# Patient Record
Sex: Female | Born: 1945 | Race: White | Hispanic: No | Marital: Married | Smoking: Never smoker
Health system: Southern US, Community
[De-identification: ages and names within clinical notes are randomized; demographics above are authoritative.]

## PROBLEM LIST (undated history)

## (undated) DIAGNOSIS — N200 Calculus of kidney: Secondary | ICD-10-CM

## (undated) DIAGNOSIS — E079 Disorder of thyroid, unspecified: Secondary | ICD-10-CM

## (undated) HISTORY — PX: CHOLECYSTECTOMY: SHX55

## (undated) HISTORY — PX: APPENDECTOMY: SHX54

## (undated) HISTORY — PX: ABDOMINAL HYSTERECTOMY: SHX81

---

## 1999-10-22 ENCOUNTER — Other Ambulatory Visit: Admission: RE | Admit: 1999-10-22 | Discharge: 1999-10-22 | Payer: Self-pay | Admitting: Family Medicine

## 2014-10-13 ENCOUNTER — Emergency Department (HOSPITAL_COMMUNITY)
Admission: EM | Admit: 2014-10-13 | Discharge: 2014-10-14 | Disposition: A | Discharge status: Home / Self Care | Payer: Medicare Other | Attending: Emergency Medicine | Admitting: Emergency Medicine

## 2014-10-13 ENCOUNTER — Encounter (HOSPITAL_COMMUNITY): Payer: Self-pay | Admitting: Emergency Medicine

## 2014-10-13 ENCOUNTER — Emergency Department (HOSPITAL_COMMUNITY): Payer: Medicare Other

## 2014-10-13 DIAGNOSIS — E079 Disorder of thyroid, unspecified: Secondary | ICD-10-CM | POA: Diagnosis not present

## 2014-10-13 DIAGNOSIS — R110 Nausea: Secondary | ICD-10-CM | POA: Insufficient documentation

## 2014-10-13 DIAGNOSIS — Z79899 Other long term (current) drug therapy: Secondary | ICD-10-CM | POA: Diagnosis not present

## 2014-10-13 DIAGNOSIS — R109 Unspecified abdominal pain: Secondary | ICD-10-CM | POA: Diagnosis present

## 2014-10-13 DIAGNOSIS — N201 Calculus of ureter: Secondary | ICD-10-CM | POA: Diagnosis not present

## 2014-10-13 HISTORY — DX: Disorder of thyroid, unspecified: E07.9

## 2014-10-13 LAB — CBC WITH DIFFERENTIAL/PLATELET
BASOS ABS: 0 10*3/uL (ref 0.0–0.1)
BASOS PCT: 0 % (ref 0–1)
EOS ABS: 0.1 10*3/uL (ref 0.0–0.7)
EOS PCT: 2 % (ref 0–5)
HCT: 40.5 % (ref 36.0–46.0)
Hemoglobin: 13.4 g/dL (ref 12.0–15.0)
LYMPHS ABS: 1.7 10*3/uL (ref 0.7–4.0)
Lymphocytes Relative: 19 % (ref 12–46)
MCH: 29.3 pg (ref 26.0–34.0)
MCHC: 33.1 g/dL (ref 30.0–36.0)
MCV: 88.6 fL (ref 78.0–100.0)
Monocytes Absolute: 0.7 10*3/uL (ref 0.1–1.0)
Monocytes Relative: 7 % (ref 3–12)
Neutro Abs: 6.5 10*3/uL (ref 1.7–7.7)
Neutrophils Relative %: 72 % (ref 43–77)
PLATELETS: 240 10*3/uL (ref 150–400)
RBC: 4.57 MIL/uL (ref 3.87–5.11)
RDW: 14 % (ref 11.5–15.5)
WBC: 9 10*3/uL (ref 4.0–10.5)

## 2014-10-13 LAB — COMPREHENSIVE METABOLIC PANEL
ALBUMIN: 3.9 g/dL (ref 3.5–5.2)
ALK PHOS: 95 U/L (ref 39–117)
ALT: 21 U/L (ref 0–35)
AST: 23 U/L (ref 0–37)
Anion gap: 15 (ref 5–15)
BUN: 19 mg/dL (ref 6–23)
CALCIUM: 9.3 mg/dL (ref 8.4–10.5)
CO2: 24 mEq/L (ref 19–32)
Chloride: 102 mEq/L (ref 96–112)
Creatinine, Ser: 0.95 mg/dL (ref 0.50–1.10)
GFR calc non Af Amer: 60 mL/min — ABNORMAL LOW (ref >90)
GFR, EST AFRICAN AMERICAN: 69 mL/min — AB (ref >90)
Glucose, Bld: 153 mg/dL — ABNORMAL HIGH (ref 70–99)
POTASSIUM: 4.3 meq/L (ref 3.7–5.3)
Sodium: 141 mEq/L (ref 137–147)
Total Bilirubin: 0.2 mg/dL — ABNORMAL LOW (ref 0.3–1.2)
Total Protein: 7.5 g/dL (ref 6.0–8.3)

## 2014-10-13 LAB — URINALYSIS, ROUTINE W REFLEX MICROSCOPIC
Bilirubin Urine: NEGATIVE
GLUCOSE, UA: NEGATIVE mg/dL
Ketones, ur: NEGATIVE mg/dL
Leukocytes, UA: NEGATIVE
Nitrite: NEGATIVE
PH: 5.5 (ref 5.0–8.0)
Protein, ur: 30 mg/dL — AB
SPECIFIC GRAVITY, URINE: 1.025 (ref 1.005–1.030)
Urobilinogen, UA: 0.2 mg/dL (ref 0.0–1.0)

## 2014-10-13 LAB — LIPASE, BLOOD: Lipase: 44 U/L (ref 11–59)

## 2014-10-13 LAB — URINE MICROSCOPIC-ADD ON

## 2014-10-13 MED ORDER — ONDANSETRON HCL 4 MG/2ML IJ SOLN
4.0000 mg | Freq: Once | INTRAMUSCULAR | Status: AC
  Administered 2014-10-13: 4 mg via INTRAVENOUS
  Filled 2014-10-13: qty 2

## 2014-10-13 MED ORDER — IOHEXOL 300 MG/ML  SOLN
25.0000 mL | Freq: Once | INTRAMUSCULAR | Status: AC | PRN
  Administered 2014-10-13: 25 mL via ORAL

## 2014-10-13 MED ORDER — FENTANYL CITRATE 0.05 MG/ML IJ SOLN
50.0000 ug | Freq: Once | INTRAMUSCULAR | Status: AC
  Administered 2014-10-13: 50 ug via INTRAVENOUS
  Filled 2014-10-13: qty 2

## 2014-10-13 MED ORDER — IOHEXOL 300 MG/ML  SOLN
100.0000 mL | Freq: Once | INTRAMUSCULAR | Status: AC | PRN
  Administered 2014-10-13: 100 mL via INTRAVENOUS

## 2014-10-13 MED ORDER — KETOROLAC TROMETHAMINE 30 MG/ML IJ SOLN
30.0000 mg | Freq: Once | INTRAMUSCULAR | Status: AC
  Administered 2014-10-13: 30 mg via INTRAVENOUS
  Filled 2014-10-13: qty 1

## 2014-10-13 NOTE — ED Notes (Addendum)
Pt presents with sudden onset left lower quadrant abdominal pain after eating- pt reports she was sitting in a chair when pain started.  Admits to normal bowel movements today, denies N/V.  Pt reports that for the past 3 weeks she had had some abdominal pain in the morning.  Denies urinary symptoms.

## 2014-10-13 NOTE — ED Notes (Signed)
CT informed pt has finished contrast 

## 2014-10-13 NOTE — ED Provider Notes (Signed)
CSN: [id number]     Arrival date & time 10/13/14  2114 History   First MD Initiated Contact with Patient 10/13/14 2241     Chief Complaint  Patient presents with  . Abdominal Pain    (Consider location/radiation/quality/duration/timing/severity/associated sxs/prior Treatment) HPI Comments: Patient is a 68 year old female with history of thyroid disease and kidney stones who presents to the emergency department for abdominal pain. Patient states that pain began fairly suddenly and onset at 1830 today. She states that pain is sharp in nature and present in her left side and radiates to her left suprapubic region. Patient states that she broke out in a cold sweat at onset of her symptoms. She also endorses associated nausea without emesis. She states that she had eaten supper just before her symptoms began. When the pain worsens she feels as though it is difficult for her to take a deep breath, though she denies feeling short of breath. Patient denies associated fever, chest pain, diarrhea, melena, hematochezia, urinary symptoms including dysuria or hematuria, and weakness or numbness. Patient states that she has had 3 normal bowel movements today. Abdominal surgical history significant for appendectomy, cholecystectomy, and partial hysterectomy.  Patient is a 68 y.o. female presenting with abdominal pain. The history is provided by the patient. No language interpreter was used.  Abdominal Pain Associated symptoms: nausea     Past Medical History  Diagnosis Date  . Thyroid disease    Past Surgical History  Procedure Laterality Date  . Abdominal hysterectomy    . Appendectomy    . Cholecystectomy     No family history on file. History  Substance Use Topics  . Smoking status: Never Smoker   . Smokeless tobacco: Not on file  . Alcohol Use: No   OB History    No data available      Review of Systems  Gastrointestinal: Positive for nausea and abdominal pain.  All other systems  reviewed and are negative.   Allergies  Codeine and Morphine and related  Home Medications   Prior to Admission medications   Medication Sig Start Date End Date Taking? Authorizing Provider  aspirin 325 MG EC tablet Take 325 mg by mouth every 6 (six) hours as needed for pain.   Yes Historical Provider, MD  Calcium Carbonate (OS-CAL PO) Take 1 tablet by mouth daily.   Yes Historical Provider, MD  cholecalciferol (VITAMIN D) 1000 UNITS tablet Take 1,500 Units by mouth daily.   Yes Historical Provider, MD  DHA-EPA-Coenzyme Q10-Vitamin E (CARDIOVID PO) Take 1 tablet by mouth daily.   Yes Historical Provider, MD  levothyroxine (SYNTHROID, LEVOTHROID) 150 MCG tablet Take 150 mcg by mouth at bedtime.   Yes Historical Provider, MD  OVER THE COUNTER MEDICATION Take 1 tablet by mouth daily. "Breathe ease"   Yes Historical Provider, MD  OVER THE COUNTER MEDICATION Take 1 tablet by mouth daily. "sugar reg"   Yes Historical Provider, MD  ibuprofen (ADVIL,MOTRIN) 600 MG tablet Take 1 tablet (600 mg total) by mouth every 6 (six) hours as needed. 10/14/14   Antony MaduraKelly Johntae Broxterman, PA-C  ondansetron (ZOFRAN) 4 MG tablet Take 1 tablet (4 mg total) by mouth every 6 (six) hours. As needed for nausea 10/14/14   Antony MaduraKelly Zeena Starkel, PA-C  oxyCODONE-acetaminophen (PERCOCET/ROXICET) 5-325 MG per tablet Take 1-2 tablets by mouth every 6 (six) hours as needed for moderate pain or severe pain. 10/14/14   Antony MaduraKelly Umberto Pavek, PA-C   BP 156/79 mmHg  Pulse 79  Temp(Src) 97.8 F (36.6 C) (  Oral)  Resp 16  Ht 5\' 4"  (1.626 m)  Wt 228 lb (103.42 kg)  BMI 39.12 kg/m2  SpO2 96%   Physical Exam  Constitutional: She is oriented to person, place, and time. She appears well-developed and well-nourished. No distress.  Nontoxic/nonseptic appearing  HENT:  Head: Normocephalic and atraumatic.  Eyes: Conjunctivae and EOM are normal. No scleral icterus.  Neck: Normal range of motion.  Cardiovascular: Normal rate, regular rhythm and normal heart sounds.     Pulmonary/Chest: Effort normal. No respiratory distress. She has no wheezes. She has no rales.  Lungs clear bilaterally. Chest expansion symmetric  Abdominal: Normal appearance. She exhibits no mass. There is no CVA tenderness.    Soft abdomen with tenderness to palpation in the left mid abdomen. No peritoneal signs or involuntary guarding. No distention noted. No CVA TTP.  Musculoskeletal: Normal range of motion.  Neurological: She is alert and oriented to person, place, and time. She exhibits normal muscle tone. Coordination normal.  GCS 15. Patient moving all extremities.  Skin: Skin is warm and dry. No rash noted. She is not diaphoretic. No erythema. No pallor.  Psychiatric: She has a normal mood and affect. Her behavior is normal.  Nursing note and vitals reviewed.   ED Course  Procedures (including critical care time) Labs Review Labs Reviewed  COMPREHENSIVE METABOLIC PANEL - Abnormal; Notable for the following:    Glucose, Bld 153 (*)    Total Bilirubin 0.2 (*)    GFR calc non Af Amer 60 (*)    GFR calc Af Amer 69 (*)    All other components within normal limits  URINALYSIS, ROUTINE W REFLEX MICROSCOPIC - Abnormal; Notable for the following:    APPearance CLOUDY (*)    Hgb urine dipstick MODERATE (*)    Protein, ur 30 (*)    All other components within normal limits  URINE MICROSCOPIC-ADD ON - Abnormal; Notable for the following:    Squamous Epithelial / LPF MANY (*)    Bacteria, UA FEW (*)    All other components within normal limits  CBC WITH DIFFERENTIAL  LIPASE, BLOOD    Imaging Review Ct Abdomen Pelvis W Contrast  10/14/2014   CLINICAL DATA:  Severe left-sided abdominal pain after eating.  EXAM: CT ABDOMEN AND PELVIS WITH CONTRAST  TECHNIQUE: Multidetector CT imaging of the abdomen and pelvis was performed using the standard protocol following bolus administration of intravenous contrast.  CONTRAST:  100mL OMNIPAQUE IOHEXOL 300 MG/ML  SOLN  COMPARISON:  04/08/2006   FINDINGS: Lung bases are clear. There is an abnormal tissue density structure in the posterior mediastinum to the right of midline. This was probably present in 2007. It now appears to be associated with low density adenopathy in the inferior hilum. This is not completely evaluated. Chest CT would probably be warranted to understand this more completely.  The liver shows a 4 mm cyst in the lateral segment of the left lobe. No other liver parenchymal abnormality. There has been previous cholecystectomy. The spleen is normal. The pancreas is normal. The adrenal glands are normal. The aorta shows atherosclerosis but no aneurysm. The IVC is normal.  The right kidney is normal. The left kidney is swollen and there is surrounding edema likely related to pyelosinus extravasation. There is an obstructing stone at the UPJ measuring 11 mm in diameter. No stones seen in the ureter distal to that. No stone in the bladder.  There is no acute bowel pathology. There is diverticulosis without evidence of  diverticulitis. No significant bony finding.  IMPRESSION: 11 mm stone at the left UPJ resulting in high-grade obstruction and pyelo sinus extravasation.  Abnormal structure in the posterior mediastinum, not fully characterize. This appears to be associated with either low-density adenopathy or a cyst at the inferior hilum on the right. It would probably be worthwhile doing a full chest CT with contrast to understand this process more completely. This could be done non emergently as an outpatient.   Electronically Signed   By: Paulina Fusi M.D.   On: 10/14/2014 00:20     EKG Interpretation None      MDM   Final diagnoses:  Abdominal pain  Ureterolithiasis    Pt has been diagnosed with a kidney stone via CT. 11mm stone seen at UPJ. Case discussed with Dr. Isabel Caprice who recommends pain control with outpatient follow up. No evidence of associated UTI. Serum creatine WNL, vitals sign stable and the patient does not have  irratractable vomiting. Pain improved to 2/10 with fentanyl and Toradol. Pt will be discharged home with pain medications and has been advised to follow up with Dr. Isabel Caprice in office. Patient will likely require stenting to pass stone. Return precautions discussed and provided. Patient agreeable to plan with no unaddressed concerns.   Filed Vitals:   10/13/14 2212 10/13/14 2245 10/13/14 2300 10/13/14 2330  BP: 160/92  147/72 156/79  Pulse: 82 77 71 79  Temp:      TempSrc:      Resp: 14 11  16   Height:      Weight:      SpO2: 95% 99%  96%      Antony Madura, PA-C 10/14/14 0044  Loren Racer, MD 10/14/14 [phone]

## 2014-10-14 MED ORDER — OXYCODONE-ACETAMINOPHEN 5-325 MG PO TABS
2.0000 | ORAL_TABLET | Freq: Once | ORAL | Status: AC
  Administered 2014-10-14: 2 via ORAL
  Filled 2014-10-14: qty 2

## 2014-10-14 MED ORDER — ONDANSETRON HCL 4 MG PO TABS: 4.0000 mg | ORAL_TABLET | Freq: Four times a day (QID) | ORAL | Status: DC

## 2014-10-14 MED ORDER — IBUPROFEN 600 MG PO TABS: 600.0000 mg | ORAL_TABLET | Freq: Four times a day (QID) | ORAL | Status: DC | PRN

## 2014-10-14 MED ORDER — OXYCODONE-ACETAMINOPHEN 5-325 MG PO TABS: 1.0000 | ORAL_TABLET | Freq: Four times a day (QID) | ORAL | Status: DC | PRN

## 2014-10-14 NOTE — ED Notes (Signed)
Wasted 100MCG of fentanyl, unable to waste in pyxis. Witnessed by [employer]-Mart[employer]ennya RN

## 2014-10-14 NOTE — Discharge Instructions (Signed)

## 2015-08-15 ENCOUNTER — Encounter (HOSPITAL_COMMUNITY): Payer: Self-pay | Admitting: Emergency Medicine

## 2015-08-15 DIAGNOSIS — E079 Disorder of thyroid, unspecified: Secondary | ICD-10-CM | POA: Diagnosis not present

## 2015-08-15 DIAGNOSIS — K5732 Diverticulitis of large intestine without perforation or abscess without bleeding: Secondary | ICD-10-CM | POA: Insufficient documentation

## 2015-08-15 DIAGNOSIS — N200 Calculus of kidney: Secondary | ICD-10-CM | POA: Diagnosis not present

## 2015-08-15 DIAGNOSIS — Z7982 Long term (current) use of aspirin: Secondary | ICD-10-CM | POA: Insufficient documentation

## 2015-08-15 DIAGNOSIS — Z79899 Other long term (current) drug therapy: Secondary | ICD-10-CM | POA: Insufficient documentation

## 2015-08-15 DIAGNOSIS — R109 Unspecified abdominal pain: Secondary | ICD-10-CM | POA: Diagnosis present

## 2015-08-15 LAB — COMPREHENSIVE METABOLIC PANEL
ALT: 25 U/L (ref 14–54)
AST: 24 U/L (ref 15–41)
Albumin: 3.8 g/dL (ref 3.5–5.0)
Alkaline Phosphatase: 81 U/L (ref 38–126)
Anion gap: 9 (ref 5–15)
BILIRUBIN TOTAL: 0.6 mg/dL (ref 0.3–1.2)
BUN: 15 mg/dL (ref 6–20)
CHLORIDE: 102 mmol/L (ref 101–111)
CO2: 28 mmol/L (ref 22–32)
CREATININE: 1.1 mg/dL — AB (ref 0.44–1.00)
Calcium: 9.3 mg/dL (ref 8.9–10.3)
GFR calc Af Amer: 58 mL/min — ABNORMAL LOW (ref >60)
GFR, EST NON AFRICAN AMERICAN: 50 mL/min — AB (ref >60)
Glucose, Bld: 122 mg/dL — ABNORMAL HIGH (ref 65–99)
POTASSIUM: 4.8 mmol/L (ref 3.5–5.1)
Sodium: 139 mmol/L (ref 135–145)
Total Protein: 7.8 g/dL (ref 6.5–8.1)

## 2015-08-15 LAB — CBC
HEMATOCRIT: 43.4 % (ref 36.0–46.0)
Hemoglobin: 14.2 g/dL (ref 12.0–15.0)
MCH: 29.9 pg (ref 26.0–34.0)
MCHC: 32.7 g/dL (ref 30.0–36.0)
MCV: 91.4 fL (ref 78.0–100.0)
PLATELETS: 256 10*3/uL (ref 150–400)
RBC: 4.75 MIL/uL (ref 3.87–5.11)
RDW: 13.5 % (ref 11.5–15.5)
WBC: 12.8 10*3/uL — AB (ref 4.0–10.5)

## 2015-08-15 LAB — URINE MICROSCOPIC-ADD ON

## 2015-08-15 LAB — URINALYSIS, ROUTINE W REFLEX MICROSCOPIC
BILIRUBIN URINE: NEGATIVE
Glucose, UA: NEGATIVE mg/dL
KETONES UR: NEGATIVE mg/dL
Leukocytes, UA: NEGATIVE
NITRITE: NEGATIVE
Protein, ur: 30 mg/dL — AB
Specific Gravity, Urine: 1.03 (ref 1.005–1.030)
UROBILINOGEN UA: 0.2 mg/dL (ref 0.0–1.0)
pH: 5 (ref 5.0–8.0)

## 2015-08-15 LAB — LIPASE, BLOOD: LIPASE: 26 U/L (ref 22–51)

## 2015-08-15 NOTE — ED Notes (Signed)
Pt. reports left lateral abdominal pain with nausea and diarrhea yesterday , denies dysuria or hematuria , no fever or chills.

## 2015-08-16 ENCOUNTER — Emergency Department (HOSPITAL_COMMUNITY)
Admission: EM | Admit: 2015-08-16 | Discharge: 2015-08-16 | Disposition: A | Discharge status: Home / Self Care | Payer: Medicare Other | Attending: Emergency Medicine | Admitting: Emergency Medicine

## 2015-08-16 ENCOUNTER — Emergency Department (HOSPITAL_COMMUNITY): Payer: Medicare Other

## 2015-08-16 DIAGNOSIS — R109 Unspecified abdominal pain: Secondary | ICD-10-CM

## 2015-08-16 DIAGNOSIS — K5732 Diverticulitis of large intestine without perforation or abscess without bleeding: Secondary | ICD-10-CM

## 2015-08-16 DIAGNOSIS — N200 Calculus of kidney: Secondary | ICD-10-CM

## 2015-08-16 HISTORY — DX: Calculus of kidney: N20.0

## 2015-08-16 MED ORDER — AMOXICILLIN-POT CLAVULANATE 875-125 MG PO TABS: 1.0000 | ORAL_TABLET | Freq: Two times a day (BID) | ORAL | Status: DC

## 2015-08-16 MED ORDER — TRAMADOL HCL 50 MG PO TABS: 50.0000 mg | ORAL_TABLET | Freq: Two times a day (BID) | ORAL | Status: DC | PRN

## 2015-08-16 MED ORDER — CEPHALEXIN 250 MG PO CAPS
500.0000 mg | ORAL_CAPSULE | Freq: Once | ORAL | Status: AC
Start: 2015-08-16 — End: 2015-08-16
  Administered 2015-08-16: 500 mg via ORAL
  Filled 2015-08-16 (×2): qty 2

## 2015-08-16 NOTE — ED Provider Notes (Signed)
CSN: [id number]     Arrival date & time 08/15/15  2141 History  This chart was scribed for Abigail Crumble, MD by Tanda Rockers, ED Scribe. This patient was seen in room A04C/A04C and the patient's care was started at 1:27 AM.  Chief Complaint  Patient presents with  . Abdominal Pain   The history is provided by the patient. No language interpreter was used.     HPI Comments: Abigail Ryan is a 69 y.o. female with hx kidney stones who presents to the Emergency Department complaining of sudden onset, sharp, left lateral abdominal pain that began 1 day ago. Pt states that the pain began in her left flank but has since moved to her lateral abdomen. Pt also complains of nausea tonight but denies vomiting. She has tried a home remedy of drinking lemon juice without relief. Denies dysuria, hematuria, fever, or any other associated symptoms.   Past Medical History  Diagnosis Date  . Thyroid disease   . Renal stones    Past Surgical History  Procedure Laterality Date  . Abdominal hysterectomy    . Appendectomy    . Cholecystectomy     No family history on file. Social History  Substance Use Topics  . Smoking status: Never Smoker   . Smokeless tobacco: None  . Alcohol Use: No   OB History    No data available     Review of Systems  10 Systems reviewed and all are negative for acute change except as noted in the HPI.  Allergies  Codeine and Morphine and related  Home Medications   Prior to Admission medications   Medication Sig Start Date End Date Taking? Authorizing Provider  aspirin 325 MG EC tablet Take 325 mg by mouth every 6 (six) hours as needed for pain.   Yes Historical Provider, MD  Aspirin-Salicylamide-Caffeine (BC HEADACHE POWDER PO) Take 1-2 packets by mouth 2 (two) times daily as needed (pain).   Yes Historical Provider, MD  Calcium Carbonate (OS-CAL PO) Take 1 tablet by mouth daily.   Yes Historical Provider, MD  cholecalciferol (VITAMIN D) 1000 UNITS tablet Take 1,500  Units by mouth daily.   Yes Historical Provider, MD  DHA-EPA-Coenzyme Q10-Vitamin E (CARDIOVID PO) Take 1 tablet by mouth daily.   Yes Historical Provider, MD  levothyroxine (SYNTHROID, LEVOTHROID) 150 MCG tablet Take 150 mcg by mouth at bedtime.   Yes Historical Provider, MD  OVER THE COUNTER MEDICATION Take 1 tablet by mouth daily. "Breathe ease"   Yes Historical Provider, MD  OVER THE COUNTER MEDICATION Take 1 tablet by mouth daily. "sugar reg"   Yes Historical Provider, MD   Triage Vitals: BP 140/99 mmHg  Pulse 95  Temp(Src) 97.6 F (36.4 C) (Oral)  Resp 18  Ht 5\' 5"  (1.651 m)  Wt 232 lb (105.235 kg)  BMI 38.61 kg/m2  SpO2 96%   Physical Exam  Constitutional: She is oriented to person, place, and time. She appears well-developed and well-nourished. No distress.  HENT:  Head: Normocephalic and atraumatic.  Nose: Nose normal.  Mouth/Throat: Oropharynx is clear and moist. No oropharyngeal exudate.  Eyes: Conjunctivae and EOM are normal. Pupils are equal, round, and reactive to light. No scleral icterus.  Neck: Normal range of motion. Neck supple. No JVD present. No tracheal deviation present. No thyromegaly present.  Cardiovascular: Normal rate, regular rhythm and normal heart sounds.  Exam reveals no gallop and no friction rub.   No murmur heard. Pulmonary/Chest: Effort normal and breath sounds normal.  No respiratory distress. She has no wheezes. She exhibits no tenderness.  Abdominal: Soft. Bowel sounds are normal. She exhibits no distension and no mass. There is tenderness. There is no rebound and no guarding.  Left side and LLQ tenderness to palpation  Musculoskeletal: Normal range of motion. She exhibits no edema or tenderness.  Lymphadenopathy:    She has no cervical adenopathy.  Neurological: She is alert and oriented to person, place, and time. No cranial nerve deficit. She exhibits normal muscle tone.  Skin: Skin is warm and dry. No rash noted. No erythema. No pallor.   Nursing note and vitals reviewed.   ED Course  Procedures (including critical care time)  DIAGNOSTIC STUDIES: Oxygen Saturation is 96% on RA, normal by my interpretation.    COORDINATION OF CARE: 1:30 AM-Discussed treatment plan which includes CT Renal Stone Study with pt at bedside and pt agreed to plan.   Labs Review Labs Reviewed  COMPREHENSIVE METABOLIC PANEL - Abnormal; Notable for the following:    Glucose, Bld 122 (*)    Creatinine, Ser 1.10 (*)    GFR calc non Af Amer 50 (*)    GFR calc Af Amer 58 (*)    All other components within normal limits  CBC - Abnormal; Notable for the following:    WBC 12.8 (*)    All other components within normal limits  URINALYSIS, ROUTINE W REFLEX MICROSCOPIC (NOT AT [facility]) - Abnormal; Notable for the following:    Hgb urine dipstick LARGE (*)    Protein, ur 30 (*)    All other components within normal limits  URINE MICROSCOPIC-ADD ON - Abnormal; Notable for the following:    Squamous Epithelial / LPF FEW (*)    Bacteria, UA MANY (*)    All other components within normal limits  LIPASE, BLOOD    Imaging Review Ct Renal Stone Study  08/16/2015   CLINICAL DATA:  Left flank pain and low back pain for 1 day. Previous history of kidney stones.  EXAM: CT ABDOMEN AND PELVIS WITHOUT CONTRAST  TECHNIQUE: Multidetector CT imaging of the abdomen and pelvis was performed following the standard protocol without IV contrast.  COMPARISON:  10/14/2014  FINDINGS: Lung bases are clear.  10 mm stone in the left renal pelvis, unchanged in position since previous study. There is no significant hydronephrosis but there is infiltration in the fat around the renal pelvis. This suggest infection without obstruction. The left ureter is decompressed. No hydronephrosis or hydroureter on the right kidney. No additional stones identified. No stones identified in the bladder. No wall thickening of the bladder.  Surgical absence of the gallbladder. No bile duct dilatation.  Unenhanced appearance of the liver, spleen, pancreas, adrenal glands, abdominal aorta, inferior vena cava, and retroperitoneal lymph nodes are unremarkable. Stomach, small bowel, and colon are not abnormally distended. There is infiltration in the fat around the upper descending colon suggesting focal colitis, likely due to diverticulitis. No free air or free fluid in the abdomen.  Pelvis: Surgical absence of the appendix. Diverticulosis of the sigmoid colon. Surgical absence of the uterus. No free or loculated pelvic fluid collections. No pelvic mass or lymphadenopathy. No destructive bone lesions.  IMPRESSION: 11 mm stone in the left renal pelvis. No change in position since previous study. No evidence of hydronephrosis but there is infiltration in the fat around the renal pelvis suggesting infection.  Diverticulosis of the colon with infiltration in the fat around the upper descending colon consistent with focal diverticulitis.  Electronically Signed   By: Burman Nieves M.D.   On: 08/16/2015 02:24   I have personally reviewed and evaluated these images and lab results as part of my medical decision-making.   EKG Interpretation None      MDM   Final diagnoses:  Flank pain    Patient presents emergency primary for left flank radiating to her abdomen pain. She states is consistent with nephrolithiasis. She has a history of this. Will obtain CT scan to evaluate for kidney stone versus other pathology. She currently is not requesting any medication for pain or nausea. Patient was given Keflex for couple UTI will be sent home with a prescription.  CT scan reveals the patient has an 11mm kidney stone that has not moved positions since last year.  There is no hydro associated either.  Her pain and nausea is well controlled without any interventions from me.  CT also reveals a small area of diverticulitis.  Because has has both processes, I believe augmentin will be the best choice of treatment.  Will  give 7 day course.  She was given tramadol to take at home as well for severe pain.  Urology fu provided.  She appears well and in NAD.  Her VS remain within her normal limits and she is safe for DC.   I personally performed the services described in this documentation, which was scribed in my presence. The recorded information has been reviewed and is accurate.     Abigail Crumble, MD 08/16/15 [phone]

## 2015-08-16 NOTE — Discharge Instructions (Signed)
Kidney Stones Abigail Ryan, your CT scan results are below.  See Urology within 3 days for close follow up.  Take ibuprofen for pain as needed.  If pain becomes severe, take tramadol.  Take Augmentin for 7 days for treatment of your infection.  If any symptoms worsen, come back to the ED immediately.  Thank you. Kidney stones (urolithiasis) are solid masses that form inside your kidneys. The intense pain is caused by the stone moving through the kidney, ureter, bladder, and urethra (urinary tract). When the stone moves, the ureter starts to spasm around the stone. The stone is usually passed in your pee (urine).  HOME CARE  Drink enough fluids to keep your pee clear or pale yellow. This helps to get the stone out.  Strain all pee through the provided strainer. Do not pee without peeing through the strainer, not even once. If you pee the stone out, catch it in the strainer. The stone may be as small as a grain of salt. Take this to your doctor. This will help your doctor figure out what you can do to try to prevent more kidney stones.  Only take medicine as told by your doctor.  Follow up with your doctor as told.  Get follow-up X-rays as told by your doctor. GET HELP IF: You have pain that gets worse even if you have been taking pain medicine. GET HELP RIGHT AWAY IF:   Your pain does not get better with medicine.  You have a fever or shaking chills.  Your pain increases and gets worse over 18 hours.  You have new belly (abdominal) pain.  You feel faint or pass out.  You are unable to pee. MAKE SURE YOU:   Understand these instructions.  Will watch your condition.  Will get help right away if you are not doing well or get worse. Document Released: 05/13/2008 Document Revised: 07/28/2013 Document Reviewed: 04/28/2013 [facility] Patient Information 2015 [place], [place]. This information is not intended to replace advice given to you by your health care provider. Make sure you discuss any  questions you have with your health care provider.  Diverticulitis Diverticulitis is when small pockets that have formed in your colon (large intestine) become infected or swollen. HOME CARE  Follow your doctor's instructions.  Follow a special diet if told by your doctor.  When you feel better, your doctor may tell you to change your diet. You may be told to eat a lot of fiber. Fruits and vegetables are good sources of fiber. Fiber makes it easier to poop (have bowel movements).  Take supplements or probiotics as told by your doctor.  Only take medicines as told by your doctor.  Keep all follow-up visits with your doctor. GET HELP IF:  Your pain does not get better.  You have a hard time eating food.  You are not pooping like normal. GET HELP RIGHT AWAY IF:  Your pain gets worse.  Your problems do not get better.  Your problems suddenly get worse.  You have a fever.  You keep throwing up (vomiting).  You have bloody or black, tarry poop (stool). MAKE SURE YOU:   Understand these instructions.  Will watch your condition.  Will get help right away if you are not doing well or get worse. Document Released: 05/13/2008 Document Revised: 11/30/2013 Document Reviewed: 10/20/2013 [facility] Patient Information 2015 [place], [place]. This information is not intended to replace advice given to you by your health care provider. Make sure you discuss any  questions you have with your health care provider.

## 2015-08-16 NOTE — ED Notes (Signed)
Pt stable, ambulatory, states understanding of discharge instructions 

## 2015-08-18 ENCOUNTER — Other Ambulatory Visit: Payer: Self-pay | Admitting: Urology

## 2015-08-22 ENCOUNTER — Encounter (HOSPITAL_COMMUNITY): Payer: Self-pay | Admitting: *Deleted

## 2015-08-24 ENCOUNTER — Encounter (HOSPITAL_COMMUNITY)
Admission: RE | Disposition: A | Discharge status: Home / Self Care | Payer: Self-pay | Source: Ambulatory Visit | Attending: Urology

## 2015-08-24 ENCOUNTER — Ambulatory Visit (HOSPITAL_COMMUNITY): Payer: Medicare Other

## 2015-08-24 ENCOUNTER — Encounter (HOSPITAL_COMMUNITY): Payer: Self-pay | Admitting: General Practice

## 2015-08-24 ENCOUNTER — Ambulatory Visit (HOSPITAL_COMMUNITY)
Admission: RE | Admit: 2015-08-24 | Discharge: 2015-08-24 | Disposition: A | Discharge status: Home / Self Care | Payer: Medicare Other | Source: Ambulatory Visit | Attending: Urology | Admitting: Urology

## 2015-08-24 DIAGNOSIS — Z7982 Long term (current) use of aspirin: Secondary | ICD-10-CM | POA: Insufficient documentation

## 2015-08-24 DIAGNOSIS — Z8673 Personal history of transient ischemic attack (TIA), and cerebral infarction without residual deficits: Secondary | ICD-10-CM | POA: Diagnosis not present

## 2015-08-24 DIAGNOSIS — J45909 Unspecified asthma, uncomplicated: Secondary | ICD-10-CM | POA: Diagnosis not present

## 2015-08-24 DIAGNOSIS — E079 Disorder of thyroid, unspecified: Secondary | ICD-10-CM | POA: Diagnosis not present

## 2015-08-24 DIAGNOSIS — N201 Calculus of ureter: Secondary | ICD-10-CM | POA: Diagnosis not present

## 2015-08-24 DIAGNOSIS — Z79899 Other long term (current) drug therapy: Secondary | ICD-10-CM | POA: Insufficient documentation

## 2015-08-24 DIAGNOSIS — R109 Unspecified abdominal pain: Secondary | ICD-10-CM | POA: Diagnosis present

## 2015-08-24 DIAGNOSIS — Z87442 Personal history of urinary calculi: Secondary | ICD-10-CM | POA: Diagnosis not present

## 2015-08-24 DIAGNOSIS — E785 Hyperlipidemia, unspecified: Secondary | ICD-10-CM | POA: Insufficient documentation

## 2015-08-24 SURGERY — LITHOTRIPSY, ESWL
Anesthesia: LOCAL | Laterality: Left

## 2015-08-24 MED ORDER — CIPROFLOXACIN HCL 500 MG PO TABS
500.0000 mg | ORAL_TABLET | ORAL | Status: AC
  Administered 2015-08-24: 500 mg via ORAL
  Filled 2015-08-24: qty 1

## 2015-08-24 MED ORDER — TAMSULOSIN HCL 0.4 MG PO CAPS: 0.4000 mg | ORAL_CAPSULE | Freq: Every day | ORAL | Status: DC

## 2015-08-24 MED ORDER — DIAZEPAM 5 MG PO TABS
10.0000 mg | ORAL_TABLET | ORAL | Status: AC
  Administered 2015-08-24: 10 mg via ORAL
  Filled 2015-08-24: qty 2

## 2015-08-24 MED ORDER — SODIUM CHLORIDE 0.9 % IV SOLN
INTRAVENOUS | Status: DC
  Administered 2015-08-24: 08:00:00 via INTRAVENOUS

## 2015-08-24 MED ORDER — DIPHENHYDRAMINE HCL 25 MG PO CAPS
25.0000 mg | ORAL_CAPSULE | ORAL | Status: AC
  Administered 2015-08-24: 25 mg via ORAL
  Filled 2015-08-24: qty 1

## 2015-08-24 NOTE — H&P (Signed)
Reason For Visit left UPJ stone   History of Present Illness Abigail Ryan who is seen today in follow-up from the ED where she presented several days prior for flank pain.  Her PCP is Dr. Windle Guard, MD.   She presented with acute onset left flank pain. This is similar to the pain that she experienced in November and she was diagnosed with a 10mm stone at the UPJ. She was afebrile and had no evidence of infection. She had a CT scan performed demonstrating slight enlargement of the stone, measuring 11mm. Her pain was easily controlled in the ED and she was discharged home with close follow-up. In addition to the stone, she was also treated for diverticulitis.    BUN/Cr - 15/1.1 on 08/15/15  Hounsfield units: 900 cc, skin to stone distance: 13.5 cm    The patient presents today, exhausted from the ongoing pain. She denies any fevers or chills. She denies any progressive voiding symptoms. She complains mostly of left-sided abdominal pain radiating down into her groin region. She also has intense back pressure. The patient has no history of kidney stones. She has no significant past medical history.   Past Medical History Problems  1. History of arthritis (Z87.39) 2. History of asthma (Z87.09) 3. History of cervical cancer (Z85.41) 4. History of hyperlipidemia (Z86.39) 5. History of thyroid disease (Z86.39) 6. 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 History of Appendectomy 2. History of Cholecystectomy 3. History of Total Abdominal Hysterectomy  Current Meds 1. Aspirin 325 MG Oral Tablet;  Therapy: (Recorded:08Sep2016) to Recorded 2. Synthroid 150 MCG Oral Tablet;  Therapy: (Recorded:08Sep2016) to Recorded 3. Vitamin D 1000 UNIT Oral Tablet;  Therapy: (Recorded:09Sep2016) to Recorded  Allergies Medication  1. Codeine Derivatives 2. Morphine Derivatives  Family History Problems  1. Family history of Death of family member : Mother,  Father   Mother at age 86; Father at age 75  Social History Problems    Denied: History of Alcohol use   Caffeine use (F15.90)   2   Never a smoker   Number of children   2 sons and 1 daughter   Retired  Review of Systems  Genitourinary: urinary frequency, urinary urgency and nocturia.  Gastrointestinal: nausea and diarrhea.  Constitutional: feeling tired (fatigue).  Respiratory: shortness of breath.    Vitals Vital Signs [Data Includes: Last 1 Day]  Recorded: 09Sep2016 08:45AM  Height: 5 ft 5 in Weight: 227 lb  BMI Calculated: 37.77 BSA Calculated: 2.09 Blood Pressure: 142 / 80 Temperature: 97.9 F Heart Rate: 76  Physical Exam Constitutional: Well nourished and well developed . No acute distress.  ENT:. The ears and nose are normal in appearance.  Neck: The appearance of the neck is normal and no neck mass is present.  Pulmonary: No respiratory distress and normal respiratory rhythm and effort.  Cardiovascular: Heart rate and rhythm are normal . No peripheral edema.  Abdomen: The abdomen is soft and nontender. No masses are palpated. mild left CVA tenderness. No hernias are palpable. No hepatosplenomegaly noted.  Lymphatics: The femoral and inguinal nodes are not enlarged or tender.  Skin: Normal skin turgor, no visible rash and no visible skin lesions.  Neuro/Psych:. Mood and affect are appropriate.    Results/Data Urine [Data Includes: Last 1 Day]   09Sep2016  COLOR AMBER   APPEARANCE CLOUDY   SPECIFIC GRAVITY >1.030   pH 5.5   GLUCOSE NEGATIVE   BILIRUBIN NEGATIVE   KETONE NEGATIVE  BLOOD TRACE   PROTEIN 1+   NITRITE NEGATIVE   LEUKOCYTE ESTERASE NEGATIVE   SQUAMOUS EPITHELIAL/HPF 6-10 HPF  WBC 6-10 WBC/HPF  RBC 3-10 RBC/HPF  BACTERIA MODERATE HPF  CRYSTALS NONE SEEN HPF  CASTS NONE SEEN LPF  Yeast NONE SEEN HPF   Patient urine analysis today demonstrates microscopic hematuria  KUB: The renal shadows are present bilaterally. The patient  has a large easily visible opacification consistent with the patient's stone in the left UPJ. There are no additional stones within the expected trajectory of the left ureter. The right side of her collecting system appears normal. Her gas pattern is grossly normal. There are no abnormalities within the bony structures.   Assessment Assessed  1. Left ureteral calculus (N20.1)  Plan  Health Maintenance  1. UA With REFLEX; [Do Not Release]; Status:Complete;   Done: 09Sep2016 08:27AM Left ureteral calculus  2. KUB; Status:Resulted - Requires Verification;   Done: 09Sep2016 09:39AM 3. URINE CULTURE; Status:In Progress - Specimen/Data Collected;   Done: 09Sep2016  Follow-up Schedule Surgery Office Follow-up Status: Hold For - Appointment Requested for: 09Sep2016 Ordered;  For: Left ureteral calculus; Ordered By: Berniece Salines Performed:  Due: 11Sep2016 Marked Important   Discussion/Summary The patient has a 10 mm left UPJ stone that appears to be ball valving causing her intermittent obstruction. Recently, it has gotten significantly worse. The patient at this point is ready to have some more aggressive intervention. I discussed with her the treatment options. In particular, I recommended that she consider either ureteroscopy or shockwave lithotripsy. I went over the procedure is with her in detail. Ultimately, the patient has opted for shockwave lithotripsy.  We discussed management options including medical expulsion therapy, shockwave lithotripsy, and ureteroscopy. Ultimately, the patient has opted for shock wave lithotripsy. I discussed with the patient the procedure in detail as well as the risk and benefits. The patient is aware that she may need additional procedures. She also is aware of the risks of hematoma and pain. We will try to get this patient's scheduled as soon as possible.

## 2015-08-24 NOTE — Op Note (Signed)
See [facility] OP note scanned into chart. Also because of the size, density, location and other factors that cannot be anticipated I feel this will likely be a staged procedure. This fact supersedes any indication in the scanned [facility] operative note to the contrary.  

## 2015-08-24 NOTE — Discharge Instructions (Signed)
See [facility] discharge instructions in chart.  

## 2017-04-08 ENCOUNTER — Other Ambulatory Visit: Payer: Self-pay | Admitting: Family Medicine

## 2017-04-08 DIAGNOSIS — Z1231 Encounter for screening mammogram for malignant neoplasm of breast: Secondary | ICD-10-CM

## 2017-04-08 DIAGNOSIS — E2839 Other primary ovarian failure: Secondary | ICD-10-CM

## 2017-05-02 ENCOUNTER — Ambulatory Visit
Admission: RE | Admit: 2017-05-02 | Discharge: 2017-05-02 | Disposition: A | Discharge status: Home / Self Care | Payer: Medicare Other | Source: Ambulatory Visit | Attending: Family Medicine | Admitting: Family Medicine

## 2017-05-02 DIAGNOSIS — Z1231 Encounter for screening mammogram for malignant neoplasm of breast: Secondary | ICD-10-CM

## 2017-05-02 DIAGNOSIS — E2839 Other primary ovarian failure: Secondary | ICD-10-CM

## 2018-02-23 ENCOUNTER — Ambulatory Visit
Admission: RE | Admit: 2018-02-23 | Discharge: 2018-02-23 | Disposition: A | Discharge status: Home / Self Care | Payer: Medicare Other | Source: Ambulatory Visit | Attending: Family Medicine | Admitting: Family Medicine

## 2018-02-23 ENCOUNTER — Other Ambulatory Visit: Payer: Self-pay | Admitting: Family Medicine

## 2018-02-23 DIAGNOSIS — R079 Chest pain, unspecified: Secondary | ICD-10-CM

## 2018-02-26 ENCOUNTER — Ambulatory Visit: Payer: Medicare PPO | Admitting: Cardiovascular Disease

## 2019-01-25 ENCOUNTER — Encounter (HOSPITAL_COMMUNITY): Payer: Self-pay

## 2019-01-25 ENCOUNTER — Emergency Department (HOSPITAL_COMMUNITY): Payer: Medicare Other

## 2019-01-25 ENCOUNTER — Inpatient Hospital Stay (HOSPITAL_COMMUNITY)
Admission: EM | Admit: 2019-01-25 | Discharge: 2019-02-01 | DRG: 871 | Disposition: A | Discharge status: Home Health | Payer: Medicare Other | Attending: Internal Medicine | Admitting: Internal Medicine

## 2019-01-25 ENCOUNTER — Other Ambulatory Visit: Payer: Self-pay

## 2019-01-25 DIAGNOSIS — R7989 Other specified abnormal findings of blood chemistry: Secondary | ICD-10-CM | POA: Diagnosis not present

## 2019-01-25 DIAGNOSIS — R6520 Severe sepsis without septic shock: Secondary | ICD-10-CM | POA: Diagnosis not present

## 2019-01-25 DIAGNOSIS — Z881 Allergy status to other antibiotic agents status: Secondary | ICD-10-CM | POA: Diagnosis not present

## 2019-01-25 DIAGNOSIS — G7281 Critical illness myopathy: Secondary | ICD-10-CM | POA: Diagnosis present

## 2019-01-25 DIAGNOSIS — I5033 Acute on chronic diastolic (congestive) heart failure: Secondary | ICD-10-CM | POA: Diagnosis present

## 2019-01-25 DIAGNOSIS — Z791 Long term (current) use of non-steroidal anti-inflammatories (NSAID): Secondary | ICD-10-CM

## 2019-01-25 DIAGNOSIS — Z7982 Long term (current) use of aspirin: Secondary | ICD-10-CM

## 2019-01-25 DIAGNOSIS — A419 Sepsis, unspecified organism: Secondary | ICD-10-CM | POA: Diagnosis present

## 2019-01-25 DIAGNOSIS — Z6839 Body mass index (BMI) 39.0-39.9, adult: Secondary | ICD-10-CM | POA: Diagnosis not present

## 2019-01-25 DIAGNOSIS — I248 Other forms of acute ischemic heart disease: Secondary | ICD-10-CM | POA: Diagnosis present

## 2019-01-25 DIAGNOSIS — E039 Hypothyroidism, unspecified: Secondary | ICD-10-CM | POA: Diagnosis present

## 2019-01-25 DIAGNOSIS — Z79899 Other long term (current) drug therapy: Secondary | ICD-10-CM | POA: Diagnosis not present

## 2019-01-25 DIAGNOSIS — R160 Hepatomegaly, not elsewhere classified: Secondary | ICD-10-CM

## 2019-01-25 DIAGNOSIS — Z79890 Hormone replacement therapy (postmenopausal): Secondary | ICD-10-CM

## 2019-01-25 DIAGNOSIS — Z87442 Personal history of urinary calculi: Secondary | ICD-10-CM | POA: Diagnosis not present

## 2019-01-25 DIAGNOSIS — Z8249 Family history of ischemic heart disease and other diseases of the circulatory system: Secondary | ICD-10-CM

## 2019-01-25 DIAGNOSIS — Z885 Allergy status to narcotic agent status: Secondary | ICD-10-CM | POA: Diagnosis not present

## 2019-01-25 DIAGNOSIS — I4891 Unspecified atrial fibrillation: Secondary | ICD-10-CM | POA: Diagnosis present

## 2019-01-25 DIAGNOSIS — Z90710 Acquired absence of both cervix and uterus: Secondary | ICD-10-CM

## 2019-01-25 DIAGNOSIS — I480 Paroxysmal atrial fibrillation: Secondary | ICD-10-CM | POA: Diagnosis present

## 2019-01-25 DIAGNOSIS — Z803 Family history of malignant neoplasm of breast: Secondary | ICD-10-CM

## 2019-01-25 DIAGNOSIS — Z9049 Acquired absence of other specified parts of digestive tract: Secondary | ICD-10-CM

## 2019-01-25 DIAGNOSIS — R778 Other specified abnormalities of plasma proteins: Secondary | ICD-10-CM | POA: Diagnosis present

## 2019-01-25 DIAGNOSIS — R079 Chest pain, unspecified: Secondary | ICD-10-CM

## 2019-01-25 DIAGNOSIS — K750 Abscess of liver: Secondary | ICD-10-CM | POA: Diagnosis present

## 2019-01-25 DIAGNOSIS — R509 Fever, unspecified: Secondary | ICD-10-CM

## 2019-01-25 DIAGNOSIS — E669 Obesity, unspecified: Secondary | ICD-10-CM | POA: Diagnosis present

## 2019-01-25 LAB — URINALYSIS, ROUTINE W REFLEX MICROSCOPIC
Bilirubin Urine: NEGATIVE
Glucose, UA: NEGATIVE mg/dL
Hgb urine dipstick: NEGATIVE
Ketones, ur: 20 mg/dL — AB
Leukocytes,Ua: NEGATIVE
Nitrite: NEGATIVE
Protein, ur: 100 mg/dL — AB
Specific Gravity, Urine: 1.017 (ref 1.005–1.030)
pH: 7 (ref 5.0–8.0)

## 2019-01-25 LAB — TSH: TSH: 0.54 u[IU]/mL (ref 0.350–4.500)

## 2019-01-25 LAB — CBC WITH DIFFERENTIAL/PLATELET
Abs Immature Granulocytes: 0.15 10*3/uL — ABNORMAL HIGH (ref 0.00–0.07)
Basophils Absolute: 0 10*3/uL (ref 0.0–0.1)
Basophils Relative: 0 %
Eosinophils Absolute: 0 10*3/uL (ref 0.0–0.5)
Eosinophils Relative: 0 %
HCT: 41.5 % (ref 36.0–46.0)
Hemoglobin: 13.4 g/dL (ref 12.0–15.0)
Immature Granulocytes: 1 %
Lymphocytes Relative: 5 %
Lymphs Abs: 0.9 10*3/uL (ref 0.7–4.0)
MCH: 29.8 pg (ref 26.0–34.0)
MCHC: 32.3 g/dL (ref 30.0–36.0)
MCV: 92.4 fL (ref 80.0–100.0)
Monocytes Absolute: 1.7 10*3/uL — ABNORMAL HIGH (ref 0.1–1.0)
Monocytes Relative: 9 %
Neutro Abs: 16.6 10*3/uL — ABNORMAL HIGH (ref 1.7–7.7)
Neutrophils Relative %: 85 %
Platelets: 184 10*3/uL (ref 150–400)
RBC: 4.49 MIL/uL (ref 3.87–5.11)
RDW: 13.4 % (ref 11.5–15.5)
WBC: 19.4 10*3/uL — ABNORMAL HIGH (ref 4.0–10.5)
nRBC: 0 % (ref 0.0–0.2)

## 2019-01-25 LAB — BASIC METABOLIC PANEL
Anion gap: 10 (ref 5–15)
BUN: 13 mg/dL (ref 8–23)
CO2: 23 mmol/L (ref 22–32)
Calcium: 8.8 mg/dL — ABNORMAL LOW (ref 8.9–10.3)
Chloride: 101 mmol/L (ref 98–111)
Creatinine, Ser: 0.9 mg/dL (ref 0.44–1.00)
GFR calc Af Amer: >60 mL/min (ref >60)
GFR calc non Af Amer: >60 mL/min (ref >60)
Glucose, Bld: 152 mg/dL — ABNORMAL HIGH (ref 70–99)
Potassium: 3.8 mmol/L (ref 3.5–5.1)
Sodium: 134 mmol/L — ABNORMAL LOW (ref 135–145)

## 2019-01-25 LAB — RESPIRATORY PANEL BY PCR

## 2019-01-25 LAB — INFLUENZA PANEL BY PCR (TYPE A & B)
Influenza A By PCR: NEGATIVE
Influenza B By PCR: NEGATIVE

## 2019-01-25 LAB — LACTIC ACID, PLASMA: Lactic Acid, Venous: 1.2 mmol/L (ref 0.5–1.9)

## 2019-01-25 LAB — TROPONIN I
Troponin I: 0.72 ng/mL (ref <0.03)
Troponin I: 0.49 ng/mL (ref <0.03)

## 2019-01-25 LAB — I-STAT TROPONIN, ED: Troponin i, poc: 0.4 ng/mL (ref 0.00–0.08)

## 2019-01-25 MED ORDER — LACTATED RINGERS IV SOLN
INTRAVENOUS | Status: DC
  Administered 2019-01-25 – 2019-01-28 (×6): via INTRAVENOUS

## 2019-01-25 MED ORDER — SODIUM CHLORIDE 0.9 % IV SOLN
500.0000 mg | INTRAVENOUS | Status: DC
  Administered 2019-01-25: 500 mg via INTRAVENOUS
  Filled 2019-01-25: qty 500

## 2019-01-25 MED ORDER — ACETAMINOPHEN 325 MG PO TABS
650.0000 mg | ORAL_TABLET | Freq: Once | ORAL | Status: AC | PRN
  Administered 2019-01-25: 650 mg via ORAL
  Filled 2019-01-25: qty 2

## 2019-01-25 MED ORDER — ACETAMINOPHEN 650 MG RE SUPP: 650.0000 mg | Freq: Four times a day (QID) | RECTAL | Status: DC | PRN

## 2019-01-25 MED ORDER — ENOXAPARIN SODIUM 40 MG/0.4ML ~~LOC~~ SOLN
40.0000 mg | SUBCUTANEOUS | Status: DC
  Administered 2019-01-25: 40 mg via SUBCUTANEOUS
  Filled 2019-01-25: qty 0.4

## 2019-01-25 MED ORDER — ASPIRIN 81 MG PO CHEW
324.0000 mg | CHEWABLE_TABLET | Freq: Once | ORAL | Status: AC
  Administered 2019-01-25: 324 mg via ORAL
  Filled 2019-01-25: qty 4

## 2019-01-25 MED ORDER — ACETAMINOPHEN 325 MG PO TABS
650.0000 mg | ORAL_TABLET | Freq: Four times a day (QID) | ORAL | Status: DC | PRN
  Administered 2019-01-25: 650 mg via ORAL
  Filled 2019-01-25: qty 2

## 2019-01-25 MED ORDER — SODIUM CHLORIDE 0.9 % IV SOLN: 500.0000 mg | INTRAVENOUS | Status: DC

## 2019-01-25 MED ORDER — SODIUM CHLORIDE 0.9 % IV SOLN: 2.0000 g | INTRAVENOUS | Status: DC

## 2019-01-25 MED ORDER — SODIUM CHLORIDE 0.9 % IV SOLN
2.0000 g | INTRAVENOUS | Status: DC
  Administered 2019-01-25: 2 g via INTRAVENOUS
  Filled 2019-01-25: qty 20

## 2019-01-25 MED ORDER — KETOROLAC TROMETHAMINE 30 MG/ML IJ SOLN
15.0000 mg | Freq: Once | INTRAMUSCULAR | Status: AC
  Administered 2019-01-25: 15 mg via INTRAVENOUS
  Filled 2019-01-25: qty 1

## 2019-01-25 MED ORDER — LEVOTHYROXINE SODIUM 150 MCG PO TABS
150.0000 ug | ORAL_TABLET | Freq: Every day | ORAL | Status: DC
  Administered 2019-01-25 – 2019-01-31 (×7): 150 ug via ORAL
  Filled 2019-01-25: qty 1
  Filled 2019-01-25 (×2): qty 2
  Filled 2019-01-25: qty 1
  Filled 2019-01-25 (×2): qty 2
  Filled 2019-01-25: qty 1
  Filled 2019-01-25 (×3): qty 2
  Filled 2019-01-25 (×4): qty 1

## 2019-01-25 NOTE — ED Notes (Signed)
Pt amble to ambulate with standby assistance on room air.  Pt remained at 95% on the pulse ox.  Pt states that she feels a lot better.

## 2019-01-25 NOTE — Progress Notes (Addendum)
NP called by floor RN because she was concerned that pt was not suitable for her unit. Per RN, pt having active CP, BP dropping, and troponin .72. NP to bedside in ED.  S: Pt denies any chest pain at present. She says she did have some left upper sternal CP which just felt heavy for a very short period of time, but this is gone. She denies radiation of that pain, nausea, vomiting. She says she was told about 25 years ago that she had a MI, but she only had a "heart ultrasound" and everything was fine. She does not have a cardiologist. Never had a cardiac cath. Does not take any blood pressure or cholesterol meds.  O: Well appearing WF in NAD. Alert and oriented. BP 119, HR 75, RR 16, SaO2 99% on RA, Temp-afebrile. Card: RRR. 12 lead EKG reviewed is without any acute changes of ST or Twave abnormalities. Her chest pain is reproducible with palpation of the left upper rib cage.  A/P: 1. CP-none at present. ED gave her 324mg  ASA. Unsure of the true cardiac hx, but she does not appear to have ACS. Her troponin did go up to .72 from .40 which is likely more demand ischemia from her sepsis. Will continue to trend. If higher or if CP recurs, can start heparin and call cardiology. She denies any hx of GIB.  2. Sepsis-on empiric abx. Afebrile at present.  KJKG, NP [facility] Update: 3rd troponin trending downward.  KJKG, NP [facility]

## 2019-01-25 NOTE — ED Notes (Signed)
Patient states she took Tylenol 325 mg x 2 tabs at 0730 today.

## 2019-01-25 NOTE — Progress Notes (Addendum)
Patient requesting medication for cough. Paged Craige Cotta. Robittusin DM PO ordered and given. Will continue to monitor.

## 2019-01-25 NOTE — ED Triage Notes (Signed)
Pt presents via EMS from home with c/o cough, headache, body aches, and fever of 104 per EMS. Pt reports she has been feeling bad since yesterday afternoon.

## 2019-01-25 NOTE — ED Provider Notes (Signed)
[facility]-EMERGENCY DEPT Provider Note   CSN: [id number] Arrival date & time: 01/25/19  1253     History   Chief Complaint Chief Complaint  Patient presents with  . Cough  . Generalized Body Aches  . Fever    HPI Abigail Ryan is a 73 y.o. female with history of acute onset, persistent and progressively worsening flulike symptoms since yesterday.  Reports fever, generalized body aches, cough productive of thick white sputum since yesterday at around 5 PM.  Reports that she has not had no known sick contacts but they were many people that were coughing at church yesterday morning.  Endorses generalized headaches when her temperature is elevated.  Notes generalized abdominal soreness but denies nausea, vomiting, diarrhea, constipation, or urinary symptoms.  No melena or hematochezia.  No hematuria.  Has been taking Tylenol with some improvement in her fever and headaches.  Maximum temperature at home 105 F.  Also notes some dyspnea on exertion.  Denies orthopnea or leg swelling.  No chest pains other than when coughing.  Has been taking supplements and drinking fluids without significant relief.  The history is provided by the patient.    Past Medical History:  Diagnosis Date  . Renal stones   . Thyroid disease     Patient Active Problem List   Diagnosis Date Noted  . Hypothyroid 01/25/2019  . Sepsis (HCC) 01/25/2019  . Elevated troponin 01/25/2019    Past Surgical History:  Procedure Laterality Date  . ABDOMINAL HYSTERECTOMY    . APPENDECTOMY    . CHOLECYSTECTOMY       OB History   No obstetric history on file.      Home Medications    Prior to Admission medications   Medication Sig Start Date End Date Taking? Authorizing Provider  acetaminophen (TYLENOL) 500 MG tablet Take 1,000 mg by mouth every 6 (six) hours as needed for moderate pain or fever.   Yes [provider]  Aspirin-Salicylamide-Caffeine (BC HEADACHE POWDER PO) Take  1-2 packets by mouth 2 (two) times daily as needed (pain).   Yes [provider]  cholecalciferol (VITAMIN D) 1000 UNITS tablet Take 1,500 Units by mouth daily.   Yes [provider]  ibuprofen (ADVIL,MOTRIN) 200 MG tablet Take 200 mg by mouth every 6 (six) hours as needed for mild pain.   Yes [provider]  levothyroxine (SYNTHROID, LEVOTHROID) 150 MCG tablet Take 150 mcg by mouth at bedtime.   Yes [provider]  OMEGA-3 KRILL OIL PO Take 1 capsule by mouth daily.   Yes [provider]  tamsulosin (FLOMAX) 0.4 MG CAPS capsule Take 1 capsule (0.4 mg total) by mouth daily. Patient not taking: Reported on 01/25/2019 08/24/15   Crist FatHerrick, Benjamin W, MD  traMADol (ULTRAM) 50 MG tablet Take 1 tablet (50 mg total) by mouth every 12 (twelve) hours as needed for severe pain. Patient not taking: Reported on 01/25/2019 08/16/15   Tomasita Crumbleni, Adeleke, MD    Family History Family History  Problem Relation Age of Onset  . Breast cancer Daughter     Social History Social History   Tobacco Use  . Smoking status: Never Smoker  . Smokeless tobacco: Never Used  Substance Use Topics  . Alcohol use: No  . Drug use: No     Allergies   Codeine and Morphine and related   Review of Systems Review of Systems  Constitutional: Positive for chills and fever.  HENT: Negative for congestion and sore throat.  Respiratory: Positive for cough and shortness of breath.   Cardiovascular: Negative for chest pain.  Gastrointestinal: Negative for abdominal pain, diarrhea, nausea and vomiting.  Genitourinary: Negative for dysuria, frequency, hematuria and urgency.  Musculoskeletal: Positive for myalgias.  All other systems reviewed and are negative.    Physical Exam Updated Vital Signs BP (!) 106/53 (BP Location: Left Arm)   Pulse 81   Temp 99.5 F (37.5 C) (Oral)   Resp 20   Ht 5' 5.5" (1.664 m)   Wt 108 kg   SpO2 98%   BMI 39.00 kg/m   Physical Exam Vitals  signs and nursing note reviewed.  Constitutional:      General: She is not in acute distress.    Appearance: She is well-developed.  HENT:     Head: Normocephalic and atraumatic.  Eyes:     General:        Right eye: No discharge.        Left eye: No discharge.     Conjunctiva/sclera: Conjunctivae normal.  Neck:     Musculoskeletal: Normal range of motion. Neck rigidity present.     Vascular: No JVD.     Trachea: No tracheal deviation.  Cardiovascular:     Rate and Rhythm: Normal rate and regular rhythm.     Pulses: Normal pulses.     Heart sounds: Normal heart sounds.     Comments: 2+ radial and DP/PT pulses bilaterally, Homans sign absent bilaterally, no lower extremity edema, no palpable cords, compartments are soft  Pulmonary:     Effort: Pulmonary effort is normal.     Breath sounds: Normal breath sounds.  Chest:     Chest wall: Tenderness present.  Abdominal:     General: Bowel sounds are normal. There is no distension.     Palpations: Abdomen is soft.     Tenderness: There is no right CVA tenderness, left CVA tenderness, guarding or rebound.     Comments: Generalized mild discomfort on palpation but no focal tenderness  Musculoskeletal:        General: No swelling.  Skin:    General: Skin is warm and dry.     Findings: No erythema.  Neurological:     General: No focal deficit present.     Mental Status: She is alert.  Psychiatric:        Behavior: Behavior normal.      ED Treatments / Results  Labs (all labs ordered are listed, but only abnormal results are displayed) Labs Reviewed  BASIC METABOLIC PANEL - Abnormal; Notable for the following components:      Result Value   Sodium 134 (*)    Glucose, Bld 152 (*)    Calcium 8.8 (*)    All other components within normal limits  CBC WITH DIFFERENTIAL/PLATELET - Abnormal; Notable for the following components:   WBC 19.4 (*)    Neutro Abs 16.6 (*)    Monocytes Absolute 1.7 (*)    Abs Immature Granulocytes  0.15 (*)    All other components within normal limits  URINALYSIS, ROUTINE W REFLEX MICROSCOPIC - Abnormal; Notable for the following components:   Ketones, ur 20 (*)    Protein, ur 100 (*)    Bacteria, UA RARE (*)    All other components within normal limits  TROPONIN I - Abnormal; Notable for the following components:   Troponin I 0.72 (*)    All other components within normal limits  TROPONIN I - Abnormal; Notable for the following  components:   Troponin I 0.49 (*)    All other components within normal limits  I-STAT TROPONIN, ED - Abnormal; Notable for the following components:   Troponin i, poc 0.40 (*)    All other components within normal limits  RESPIRATORY PANEL BY PCR  CULTURE, BLOOD (ROUTINE X 2)  CULTURE, BLOOD (ROUTINE X 2)  INFLUENZA PANEL BY PCR (TYPE A & B)  LACTIC ACID, PLASMA  TSH  COMPREHENSIVE METABOLIC PANEL  CBC  TROPONIN I  TROPONIN I    EKG EKG Interpretation  Date/Time:  Monday January 25 2019 19:50:19 EST Ventricular Rate:  75 PR Interval:    QRS Duration: 91 QT Interval:  430 QTC Calculation: 481 R Axis:   90 Text Interpretation:  Sinus arrhythmia Anteroseptal infarct, age indeterminate No significant change was found Confirmed by Azalia Bilis ([id number]) on 01/25/2019 8:06:38 PM   Radiology Dg Chest 2 View  Result Date: 01/25/2019 CLINICAL DATA:  Cough and headache EXAM: CHEST - 2 VIEW COMPARISON:  02/23/2018 FINDINGS: The heart size and mediastinal contours are within normal limits. Both lungs are clear. The visualized skeletal structures are unremarkable. IMPRESSION: No active cardiopulmonary disease. Electronically Signed   By: Deatra Robinson M.D.   On: 01/25/2019 15:15    Procedures .Critical Care Performed by: Jeanie Sewer, PA-C Authorized by: Jeanie Sewer, PA-C   Critical care provider statement:    Critical care time (minutes):  40   Critical care was necessary to treat or prevent imminent or life-threatening deterioration of the  following conditions:  Sepsis   Critical care was time spent personally by me on the following activities:  Discussions with consultants, evaluation of patient's response to treatment, examination of patient, ordering and performing treatments and interventions, ordering and review of laboratory studies, ordering and review of radiographic studies, pulse oximetry, re-evaluation of patient's condition, obtaining history from patient or surrogate and review of old charts   I assumed direction of critical care for this patient from another provider in my specialty: no     (including critical care time)  Medications Ordered in ED Medications  enoxaparin (LOVENOX) injection 40 mg (40 mg Subcutaneous Given 01/25/19 2242)  lactated ringers infusion ( Intravenous Rate/Dose Verify 01/25/19 1903)  acetaminophen (TYLENOL) tablet 650 mg (650 mg Oral Given 01/25/19 2253)    Or  acetaminophen (TYLENOL) suppository 650 mg ( Rectal See Alternative 01/25/19 2253)  cefTRIAXone (ROCEPHIN) 2 g in sodium chloride 0.9 % 100 mL IVPB (has no administration in time range)  azithromycin (ZITHROMAX) 500 mg in sodium chloride 0.9 % 250 mL IVPB (has no administration in time range)  levothyroxine (SYNTHROID, LEVOTHROID) tablet 150 mcg (150 mcg Oral Given 01/25/19 2242)  guaiFENesin-dextromethorphan (ROBITUSSIN DM) 100-10 MG/5ML syrup 5 mL (5 mLs Oral Given 01/26/19 0013)  acetaminophen (TYLENOL) tablet 650 mg (650 mg Oral Given 01/25/19 1344)  ketorolac (TORADOL) 30 MG/ML injection 15 mg (15 mg Intravenous Given 01/25/19 1600)  aspirin chewable tablet 324 mg (324 mg Oral Given 01/25/19 1951)     Initial Impression / Assessment and Plan / ED Course  I have reviewed the triage vital signs and the nursing notes.  Pertinent labs & imaging results that were available during my care of the patient were reviewed by me and considered in my medical decision making (see chart for details).     Patient presenting for evaluation of  fevers and flulike symptoms since yesterday.  She is febrile, tachycardic, and tachypneic on initial presentation.  Chest x-ray  shows no acute cardiopulmonary abnormalities.  EKG shows normal sinus rhythm, no significant changes from last tracing.  Lab work reviewed by me significant for leukocytosis, no anemia, no metabolic derangements.  Initial troponin is elevated likely due to demand ischemia secondary to sepsis.  No active chest pain on my assessment.  Meets criteria for sepsis, will obtain blood cultures and start empiric antibiotics for presumed respiratory illness.  Will require admission for further evaluation and management.  She was ambulated with stable SPO2 saturations and reports that she is feeling better as her temperature has improved. Dr. Rhona Leavens with [facility] hospitalist service agrees to assume care of patient and bring her into the hospital for further evaluation management.  Patient seen and evaluated Dr. Estell Harpin who agrees with assessment and plan at this time.  8:43 PM Spoke with Dr. Tressie Ellis with Cardiology who states the cardiology service will evaluate the patient in the AM for further recommendations.  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

## 2019-01-25 NOTE — H&P (Signed)
History and Physical    LANITA PANARELLO DXA:[id number] DOB: 05-Nov-1946 DOA: 01/25/2019  PCP: Kaleen Mask, MD  Patient coming from: Home  Chief Complaint: Fevers  HPI: CHONTEL ARCHILA is a 73 y.o. female with medical history significant of hypothyroid presents to ED with 1 day hx of mild productive cough, chest pains. Denies sick contacts. Reports sitting watching TV around 5pm on day prior to admit when pt suddenly felt flushed and feverish. Temp note to be as high as 105F. Fevers persisted overnight into day of admit. Initially denied cough, however later reported mild cough productive of thick sputum. Denies diarrhea or neck stiffness.  ED Course: In the ED, pt note to be febrile to 104F improved with tylenol. CXR found to be clear. UA and blood cultures were ordered, pending. Flu was found to be neg. Patient was started on empiric azithromycin and rocephin. Pt also noted to have trop of 0.4 with no ischemic changes on EKG. Cardiology consulted by EDP. Hospitalist consulted for consideration for admission. Of note, patient has since reported feeling much better after tylenol and one dose of toradol.  Review of Systems:  Review of Systems  Constitutional: Positive for fever and malaise/fatigue.  HENT: Negative for ear discharge, ear pain and nosebleeds.   Eyes: Negative for double vision, photophobia and pain.  Respiratory: Positive for cough and shortness of breath. Negative for sputum production.   Cardiovascular: Positive for chest pain. Negative for claudication and leg swelling.  Gastrointestinal: Negative for blood in stool, constipation, nausea and vomiting.  Genitourinary: Negative for frequency, hematuria and urgency.  Musculoskeletal: Negative for back pain, joint pain and neck pain.  Neurological: Negative for tremors, sensory change, seizures and loss of consciousness.  Psychiatric/Behavioral: Negative for hallucinations, memory loss and substance abuse. The patient does  not have insomnia.     Past Medical History:  Diagnosis Date  . Renal stones   . Thyroid disease     Past Surgical History:  Procedure Laterality Date  . ABDOMINAL HYSTERECTOMY    . APPENDECTOMY    . CHOLECYSTECTOMY       reports that she has never smoked. She has never used smokeless tobacco. She reports that she does not drink alcohol or use drugs.  Allergies  Allergen Reactions  . Codeine Nausea Only and Other (See Comments)    Extreme headaches also  . Morphine And Related Other (See Comments)    Extreme headaches    Family History  Problem Relation Age of Onset  . Breast cancer Daughter     Prior to Admission medications   Medication Sig Start Date End Date Taking? Authorizing Provider  acetaminophen (TYLENOL) 500 MG tablet Take 1,000 mg by mouth every 6 (six) hours as needed for moderate pain or fever.   Yes [provider]  Aspirin-Salicylamide-Caffeine (BC HEADACHE POWDER PO) Take 1-2 packets by mouth 2 (two) times daily as needed (pain).   Yes [provider]  cholecalciferol (VITAMIN D) 1000 UNITS tablet Take 1,500 Units by mouth daily.   Yes [provider]  ibuprofen (ADVIL,MOTRIN) 200 MG tablet Take 200 mg by mouth every 6 (six) hours as needed for mild pain.   Yes [provider]  levothyroxine (SYNTHROID, LEVOTHROID) 150 MCG tablet Take 150 mcg by mouth at bedtime.   Yes [provider]  OMEGA-3 KRILL OIL PO Take 1 capsule by mouth daily.   Yes [provider]  tamsulosin (FLOMAX) 0.4 MG CAPS capsule Take 1 capsule (0.4 mg  total) by mouth daily. Patient not taking: Reported on 01/25/2019 08/24/15   Crist FatHerrick, Benjamin W, MD  traMADol (ULTRAM) 50 MG tablet Take 1 tablet (50 mg total) by mouth every 12 (twelve) hours as needed for severe pain. Patient not taking: Reported on 01/25/2019 08/16/15   Tomasita Crumbleni, Adeleke, MD    Physical Exam: Vitals:   01/25/19 1522 01/25/19 1530 01/25/19 1604 01/25/19 1615  BP:  140/60  136/67   Pulse:  88 84 84  Resp:  (!) 25 (!) 26 18  Temp: (!) 102.6 F (39.2 C)  99.4 F (37.4 C)   TempSrc: Oral  Oral   SpO2:  96% 95% 96%  Weight:      Height:        Constitutional: NAD, calm, comfortable Vitals:   01/25/19 1522 01/25/19 1530 01/25/19 1604 01/25/19 1615  BP:  140/60 136/67   Pulse:  88 84 84  Resp:  (!) 25 (!) 26 18  Temp: (!) 102.6 F (39.2 C)  99.4 F (37.4 C)   TempSrc: Oral  Oral   SpO2:  96% 95% 96%  Weight:      Height:       Eyes: PERRL, lids and conjunctivae normal ENMT: Mucous membranes are moist. Posterior pharynx clear of any exudate or lesions.Normal dentition.  Neck: normal, supple, no masses, no thyromegaly Respiratory: CTA B, no wheezing, distant breath sounds Cardiovascular: Regular rate and rhythm, s1, s2 Abdomen: no tenderness, no masses palpated. No hepatosplenomegaly. Bowel sounds positive.  Musculoskeletal: no clubbing / cyanosis. No joint deformity upper and lower extremities. Good ROM, no contractures. Normal muscle tone.  Skin: no rashes, lesions, ulcers. No induration Neurologic: CN 2-12 grossly intact. Sensation intact, DTR normal. Strength 5/5 in all 4.  Psychiatric: Normal judgment and insight. Alert and oriented x 3. Normal mood.    Labs on Admission: I have personally reviewed following labs and imaging studies  CBC: Recent Labs  Lab 01/25/19 1431  WBC 19.4*  NEUTROABS 16.6*  HGB 13.4  HCT 41.5  MCV 92.4  PLT 184   Basic Metabolic Panel: Recent Labs  Lab 01/25/19 1431  NA 134*  K 3.8  CL 101  CO2 23  GLUCOSE 152*  BUN 13  CREATININE 0.90  CALCIUM 8.8*   GFR: Estimated Creatinine Clearance: 67.6 mL/min (by C-G formula based on SCr of 0.9 mg/dL). Liver Function Tests: No results for input(s): AST, ALT, ALKPHOS, BILITOT, PROT, ALBUMIN in the last 168 hours. No results for input(s): LIPASE, AMYLASE in the last 168 hours. No results for input(s): AMMONIA in the last 168 hours. Coagulation Profile: No  results for input(s): INR, PROTIME in the last 168 hours. Cardiac Enzymes: No results for input(s): CKTOTAL, CKMB, CKMBINDEX, TROPONINI in the last 168 hours. BNP (last 3 results) No results for input(s): PROBNP in the last 8760 hours. HbA1C: No results for input(s): HGBA1C in the last 72 hours. CBG: No results for input(s): GLUCAP in the last 168 hours. Lipid Profile: No results for input(s): CHOL, HDL, LDLCALC, TRIG, CHOLHDL, LDLDIRECT in the last 72 hours. Thyroid Function Tests: No results for input(s): TSH, T4TOTAL, FREET4, T3FREE, THYROIDAB in the last 72 hours. Anemia Panel: No results for input(s): VITAMINB12, FOLATE, FERRITIN, TIBC, IRON, RETICCTPCT in the last 72 hours. Urine analysis:    Component Value Date/Time   COLORURINE YELLOW 08/15/2015 2223   APPEARANCEUR CLEAR 08/15/2015 2223   LABSPEC 1.030 08/15/2015 2223   PHURINE 5.0 08/15/2015 2223   GLUCOSEU NEGATIVE 08/15/2015 2223  HGBUR LARGE (A) 08/15/2015 2223   BILIRUBINUR NEGATIVE 08/15/2015 2223   KETONESUR NEGATIVE 08/15/2015 2223   PROTEINUR 30 (A) 08/15/2015 2223   UROBILINOGEN 0.2 08/15/2015 2223   NITRITE NEGATIVE 08/15/2015 2223   LEUKOCYTESUR NEGATIVE 08/15/2015 2223   Sepsis Labs: !!!!!!!!!!!!!!!!!!!!!!!!!!!!!!!!!!!!!!!!!!!! @LABRCNTIP (procalcitonin:4,lacticidven:4) )No results found for this or any previous visit (from the past 240 hour(s)).   Radiological Exams on Admission: Dg Chest 2 View  Result Date: 01/25/2019 CLINICAL DATA:  Cough and headache EXAM: CHEST - 2 VIEW COMPARISON:  02/23/2018 FINDINGS: The heart size and mediastinal contours are within normal limits. Both lungs are clear. The visualized skeletal structures are unremarkable. IMPRESSION: No active cardiopulmonary disease. Electronically Signed   By: Deatra Robinson M.D.   On: 01/25/2019 15:15    EKG: Independently reviewed. NSR, nonischemic  Assessment/Plan Principal Problem:   Sepsis (HCC) Active Problems:   Hypothyroid    Elevated troponin  1. Sepsis from unclear source 1. Presents with high fevers, tachypnea, elevated WBC of 19k 2. Flu neg. CXR reviewed personally, clear 3. Given sx of productive cough, would continue with empiric tx for CAP with azithro and rocephin pending below results 4. UA, blood cx ordered by ED, pending 5. Follow up respiratory viral panel 6. Repeat CBC and cmp in AM 2. Elevated troponin 1. Presenting trop of 0.4 2. Currently chest pain free 3. EKG personally reviewed, nonischemic 4. Follow serial troponin 3. Chest pain 1. Suspect related to presenting cough 2. Continue with analgesics as tolerated 3. Follow serial trop per above 4. Hypothyroid 1. Continue home regimen as tolerated 2. Will check TSH  DVT prophylaxis: Lovenox subQ  Code Status: Full Family Communication: Pt in room  Disposition Plan: Uncertain at this time  Consults called:  Admission status: Inpatient as pt presents septic, would require greater than 2 midnight stay to treat sepsis   Rickey Barbara MD [facility] Pager On Amion  If 7PM-7AM, please contact night-coverage  01/25/2019, 5:21 PM

## 2019-01-25 NOTE — ED Notes (Signed)
Date and time results received: 01/25/19 18:53  Test: Troponin  Critical Value: 0.72  Name of Provider Notified: Dr. Johnna Acosta  Orders Received? Or Actions Taken?: Continue to monitor patient and await for new orders.

## 2019-01-25 NOTE — ED Notes (Signed)
Report attempted to floor RN at this time.  RN to speak with hospitalist and call back with update.

## 2019-01-25 NOTE — ED Notes (Signed)
ED TO INPATIENT HANDOFF REPORT  Name/Age/Gender Abigail Ryan 73 y.o. female  Code Status    Code Status Orders  (From admission, onward)         Start     Ordered   01/25/19 1819  Full code  Continuous     01/25/19 1820        Code Status History    This patient has a current code status but no historical code status.    Advance Directive Documentation     Most Recent Value  Type of Advance Directive  Healthcare Power of Attorney, Living will  Pre-existing out of facility DNR order (yellow form or pink MOST form)  -  "MOST" Form in Place?  -      Home/SNF/Other Home  Chief Complaint flu like sym   Level of Care/Admitting Diagnosis ED Disposition    ED Disposition Condition Comment   Admit  [facility] [[id number]]  Level of Care: Telemetry [5]  Admit to tele based on following criteria: Complex arrhythmia (Bradycardia/Tachycardia)  Diagnosis: Sepsis (HCC) [[id number]]) [[id number]]  Admitting Physician: Jerald KiefHIU, STEPHEN K [[id number]]  Attending Physician: Jerald KiefHIU, STEPHEN K [[id number]]  Estimated length of stay: 3 - 4 days  Certification:: I certify this patient will need inpatient services for at least 2 midnights  PT Class (Do Not Modify): Inpatient [101]  PT Acc Code (Do Not Modify): Private [1]       Medical History Past Medical History:  Diagnosis Date  . Renal stones   . Thyroid disease     Allergies Allergies  Allergen Reactions  . Codeine Nausea Only and Other (See Comments)    Extreme headaches also  . Morphine And Related Other (See Comments)    Extreme headaches    IV Location/Drains/Wounds Patient Lines/Drains/Airways Status   Active Line/Drains/Airways    Name:   Placement date:   Placement time:   Site:   Days:   Peripheral IV 01/25/19 Left Hand   01/25/19    1430    Hand   less than 1   Peripheral IV 01/25/19 Left Forearm   01/25/19    1751    Forearm   less than 1          Labs/Imaging Results for orders placed or  performed during the hospital encounter of 01/25/19 (from the past 48 hour(s))  Basic metabolic panel     Status: Abnormal   Collection Time: 01/25/19  2:31 PM  Result Value Ref Range   Sodium 134 (L) 135 - 145 mmol/L   Potassium 3.8 3.5 - 5.1 mmol/L   Chloride 101 98 - 111 mmol/L   CO2 23 22 - 32 mmol/L   Glucose, Bld 152 (H) 70 - 99 mg/dL   BUN 13 8 - 23 mg/dL   Creatinine, Ser 1.470.90 0.44 - 1.00 mg/dL   Calcium 8.8 (L) 8.9 - 10.3 mg/dL   GFR calc non Af Amer >60 >60 mL/min   GFR calc Af Amer >60 >60 mL/min   Anion gap 10 5 - 15    Comment: Performed at [facility], [address] [place], [place] [id number]  CBC with Differential     Status: Abnormal   Collection Time: 01/25/19  2:31 PM  Result Value Ref Range   WBC 19.4 (H) 4.0 - 10.5 K/uL   RBC 4.49 3.87 - 5.11 MIL/uL   Hemoglobin 13.4 12.0 - 15.0 g/dL   HCT 21.341.5 08.636.0 - 57.846.0 %  MCV 92.4 80.0 - 100.0 fL   MCH 29.8 26.0 - 34.0 pg   MCHC 32.3 30.0 - 36.0 g/dL   RDW 63.0 16.0 - 10.9 %   Platelets 184 150 - 400 K/uL   nRBC 0.0 0.0 - 0.2 %   Neutrophils Relative % 85 %   Neutro Abs 16.6 (H) 1.7 - 7.7 K/uL   Lymphocytes Relative 5 %   Lymphs Abs 0.9 0.7 - 4.0 K/uL   Monocytes Relative 9 %   Monocytes Absolute 1.7 (H) 0.1 - 1.0 K/uL   Eosinophils Relative 0 %   Eosinophils Absolute 0.0 0.0 - 0.5 K/uL   Basophils Relative 0 %   Basophils Absolute 0.0 0.0 - 0.1 K/uL   WBC Morphology MORPHOLOGY UNREMARKABLE    Immature Granulocytes 1 %   Abs Immature Granulocytes 0.15 (H) 0.00 - 0.07 K/uL    Comment: Performed at [facility], [address] [place], [place] [address]  Influenza panel by PCR (type A & B)     Status: None   Collection Time: 01/25/19  2:31 PM  Result Value Ref Range   Influenza A By PCR NEGATIVE NEGATIVE   Influenza B By PCR NEGATIVE NEGATIVE    Comment: (NOTE) The Xpert Xpress Flu assay is intended as an aid in the diagnosis of  influenza and should not be used as a  sole basis for treatment.  This  assay is FDA approved for nasopharyngeal swab specimens only. Nasal  washings and aspirates are unacceptable for Xpert Xpress Flu testing. Performed at [facility], [address], [place], [place] [address]   Lactic acid, plasma     Status: None   Collection Time: 01/25/19  2:31 PM  Result Value Ref Range   Lactic Acid, Venous 1.2 0.5 - 1.9 mmol/L    Comment: Performed at [facility], [address], [place], [place] [address]  I-Stat Troponin, ED (not at [facility])     Status: Abnormal   Collection Time: 01/25/19  2:44 PM  Result Value Ref Range   Troponin i, poc 0.40 (HH) 0.00 - 0.08 ng/mL   Comment NOTIFIED PHYSICIAN    Comment 3            Comment: Due to the release kinetics of cTnI, a negative result within the first hours of the onset of symptoms does not rule out myocardial infarction with certainty. If myocardial infarction is still suspected, repeat the test at appropriate intervals.   Urinalysis, Routine w reflex microscopic     Status: Abnormal   Collection Time: 01/25/19  5:51 PM  Result Value Ref Range   Color, Urine YELLOW YELLOW   APPearance CLEAR CLEAR   Specific Gravity, Urine 1.017 1.005 - 1.030   pH 7.0 5.0 - 8.0   Glucose, UA NEGATIVE NEGATIVE mg/dL   Hgb urine dipstick NEGATIVE NEGATIVE   Bilirubin Urine NEGATIVE NEGATIVE   Ketones, ur 20 (A) NEGATIVE mg/dL   Protein, ur 062 (A) NEGATIVE mg/dL   Nitrite NEGATIVE NEGATIVE   Leukocytes,Ua NEGATIVE NEGATIVE   RBC / HPF 0-5 0 - 5 RBC/hpf   WBC, UA 0-5 0 - 5 WBC/hpf   Bacteria, UA RARE (A) NONE SEEN   Squamous Epithelial / LPF 0-5 0 - 5   Mucus PRESENT     Comment: Performed at [facility], [address] [place], [place] [address]  Troponin I - ONCE - STAT     Status: Abnormal  Collection Time: 01/25/19  5:56 PM  Result Value Ref Range   Troponin I 0.72 (HH) <0.03 ng/mL    Comment: CRITICAL RESULT CALLED TO, READ  BACK BY AND VERIFIED WITH: [facility] AT 1852 ON 01/25/2019 BY MOSLEY,J Performed at [facility], [address] [place], [place] [address]    Dg Chest 2 View  Result Date: 01/25/2019 CLINICAL DATA:  Cough and headache EXAM: CHEST - 2 VIEW COMPARISON:  02/23/2018 FINDINGS: The heart size and mediastinal contours are within normal limits. Both lungs are clear. The visualized skeletal structures are unremarkable. IMPRESSION: No active cardiopulmonary disease. Electronically Signed   By: Deatra Robinson M.D.   On: 01/25/2019 15:15    Pending Labs Unresulted Labs (From admission, onward)    Start     Ordered   02/01/19 0500  Creatinine, serum  (enoxaparin (LOVENOX)    CrCl >/= 30 ml/min)  Weekly,   R    Comments:  while on enoxaparin therapy    01/25/19 1820   01/26/19 0500  Comprehensive metabolic panel  Tomorrow morning,   R     01/25/19 1820   01/26/19 0500  CBC  Tomorrow morning,   R     01/25/19 1820   01/25/19 1820  TSH  Once,   R     01/25/19 1820   01/25/19 1819  CBC  (enoxaparin (LOVENOX)    CrCl >/= 30 ml/min)  Once,   R    Comments:  Baseline for enoxaparin therapy IF NOT ALREADY DRAWN.  Notify MD if PLT < 100 K.    01/25/19 1820   01/25/19 1819  Creatinine, serum  (enoxaparin (LOVENOX)    CrCl >/= 30 ml/min)  Once,   R    Comments:  Baseline for enoxaparin therapy IF NOT ALREADY DRAWN.    01/25/19 1820   01/25/19 1819  Troponin I - Now Then Q6H  Now then every 6 hours,   R     01/25/19 1820   01/25/19 1714  Blood Culture (routine x 2)  BLOOD CULTURE X 2,   STAT     01/25/19 1714   01/25/19 1713  Respiratory Panel by PCR  (Respiratory virus panel with precautions)  Once,   R     01/25/19 1714          Vitals/Pain Today's Vitals   01/25/19 1937 01/25/19 1945 01/25/19 1946 01/25/19 2000  BP:  106/67 106/67 (!) 116/92  Pulse: 74 76 77 (!) 101  Resp: 16 16 15    Temp:   98.2 F (36.8 C)   TempSrc:   Oral   SpO2: 100% 97% 97% 99%  Weight:       Height:      PainSc:        Isolation Precautions Droplet precaution  Medications Medications  cefTRIAXone (ROCEPHIN) 2 g in sodium chloride 0.9 % 100 mL IVPB (0 g Intravenous Stopped 01/25/19 1901)  azithromycin (ZITHROMAX) 500 mg in sodium chloride 0.9 % 250 mL IVPB ( Intravenous Rate/Dose Verify 01/25/19 1903)  enoxaparin (LOVENOX) injection 40 mg (has no administration in time range)  lactated ringers infusion ( Intravenous Rate/Dose Verify 01/25/19 1903)  acetaminophen (TYLENOL) tablet 650 mg (has no administration in time range)    Or  acetaminophen (TYLENOL) suppository 650 mg (has no administration in time range)  cefTRIAXone (ROCEPHIN) 2 g in sodium chloride 0.9 % 100 mL IVPB (has no administration in time range)  azithromycin (ZITHROMAX) 500 mg in sodium chloride 0.9 %  250 mL IVPB (has no administration in time range)  levothyroxine (SYNTHROID, LEVOTHROID) tablet 150 mcg (has no administration in time range)  acetaminophen (TYLENOL) tablet 650 mg (650 mg Oral Given 01/25/19 1344)  ketorolac (TORADOL) 30 MG/ML injection 15 mg (15 mg Intravenous Given 01/25/19 1600)  aspirin chewable tablet 324 mg (324 mg Oral Given 01/25/19 1951)    Mobility walks

## 2019-01-25 NOTE — ED Triage Notes (Signed)
Patient c/o body aches, fever, and a productive cough with thick white sputum since yesterday.

## 2019-01-26 ENCOUNTER — Inpatient Hospital Stay (HOSPITAL_COMMUNITY): Payer: Medicare Other

## 2019-01-26 ENCOUNTER — Encounter (HOSPITAL_COMMUNITY): Payer: Self-pay | Admitting: Physician Assistant

## 2019-01-26 DIAGNOSIS — R7989 Other specified abnormal findings of blood chemistry: Secondary | ICD-10-CM

## 2019-01-26 DIAGNOSIS — R6520 Severe sepsis without septic shock: Secondary | ICD-10-CM

## 2019-01-26 DIAGNOSIS — G7281 Critical illness myopathy: Secondary | ICD-10-CM

## 2019-01-26 DIAGNOSIS — I4891 Unspecified atrial fibrillation: Secondary | ICD-10-CM

## 2019-01-26 LAB — COMPREHENSIVE METABOLIC PANEL
ALT: 41 U/L (ref 0–44)
AST: 43 U/L — ABNORMAL HIGH (ref 15–41)
Albumin: 3.1 g/dL — ABNORMAL LOW (ref 3.5–5.0)
Alkaline Phosphatase: 82 U/L (ref 38–126)
Anion gap: 11 (ref 5–15)
BILIRUBIN TOTAL: 1.5 mg/dL — AB (ref 0.3–1.2)
BUN: 21 mg/dL (ref 8–23)
CO2: 20 mmol/L — ABNORMAL LOW (ref 22–32)
Calcium: 8 mg/dL — ABNORMAL LOW (ref 8.9–10.3)
Chloride: 102 mmol/L (ref 98–111)
Creatinine, Ser: 1.01 mg/dL — ABNORMAL HIGH (ref 0.44–1.00)
GFR calc Af Amer: >60 mL/min (ref >60)
GFR calc non Af Amer: 55 mL/min — ABNORMAL LOW (ref >60)
GLUCOSE: 147 mg/dL — AB (ref 70–99)
Potassium: 3.9 mmol/L (ref 3.5–5.1)
Sodium: 133 mmol/L — ABNORMAL LOW (ref 135–145)
Total Protein: 6.4 g/dL — ABNORMAL LOW (ref 6.5–8.1)

## 2019-01-26 LAB — LACTIC ACID, PLASMA
LACTIC ACID, VENOUS: 1.1 mmol/L (ref 0.5–1.9)
Lactic Acid, Venous: 1.3 mmol/L (ref 0.5–1.9)

## 2019-01-26 LAB — CBC
HCT: 36.2 % (ref 36.0–46.0)
Hemoglobin: 11.3 g/dL — ABNORMAL LOW (ref 12.0–15.0)
MCH: 29.5 pg (ref 26.0–34.0)
MCHC: 31.2 g/dL (ref 30.0–36.0)
MCV: 94.5 fL (ref 80.0–100.0)
Platelets: 162 10*3/uL (ref 150–400)
RBC: 3.83 MIL/uL — ABNORMAL LOW (ref 3.87–5.11)
RDW: 13.7 % (ref 11.5–15.5)
WBC: 18.1 10*3/uL — ABNORMAL HIGH (ref 4.0–10.5)
nRBC: 0 % (ref 0.0–0.2)

## 2019-01-26 LAB — TROPONIN I
Troponin I: 0.47 ng/mL (ref <0.03)
Troponin I: 0.35 ng/mL (ref <0.03)

## 2019-01-26 LAB — ECHOCARDIOGRAM COMPLETE
Height: 65.5 in
Weight: 3808 oz

## 2019-01-26 MED ORDER — GUAIFENESIN-DM 100-10 MG/5ML PO SYRP
5.0000 mL | ORAL_SOLUTION | ORAL | Status: DC | PRN
  Administered 2019-01-26 – 2019-01-28 (×9): 5 mL via ORAL
  Filled 2019-01-26 (×9): qty 10

## 2019-01-26 MED ORDER — FENTANYL CITRATE (PF) 100 MCG/2ML IJ SOLN
25.0000 ug | Freq: Once | INTRAMUSCULAR | Status: AC
  Administered 2019-01-26: 25 ug via INTRAVENOUS
  Filled 2019-01-26: qty 2

## 2019-01-26 MED ORDER — ACETAMINOPHEN 650 MG RE SUPP: 650.0000 mg | RECTAL | Status: DC | PRN

## 2019-01-26 MED ORDER — METRONIDAZOLE IN NACL 5-0.79 MG/ML-% IV SOLN
500.0000 mg | Freq: Once | INTRAVENOUS | Status: AC
  Administered 2019-01-26: 500 mg via INTRAVENOUS
  Filled 2019-01-26: qty 100

## 2019-01-26 MED ORDER — SODIUM CHLORIDE 0.9 % IV SOLN
3.0000 g | Freq: Four times a day (QID) | INTRAVENOUS | Status: DC
  Administered 2019-01-26 – 2019-02-01 (×24): 3 g via INTRAVENOUS
  Filled 2019-01-26 (×26): qty 3

## 2019-01-26 MED ORDER — TRAMADOL HCL 50 MG PO TABS
50.0000 mg | ORAL_TABLET | Freq: Four times a day (QID) | ORAL | Status: DC | PRN
  Administered 2019-01-26 – 2019-01-31 (×12): 50 mg via ORAL
  Filled 2019-01-26 (×12): qty 1

## 2019-01-26 MED ORDER — SODIUM CHLORIDE (PF) 0.9 % IJ SOLN
INTRAMUSCULAR | Status: AC
  Filled 2019-01-26: qty 50

## 2019-01-26 MED ORDER — ACETAMINOPHEN 325 MG PO TABS
650.0000 mg | ORAL_TABLET | ORAL | Status: DC | PRN
  Administered 2019-01-26 – 2019-02-01 (×8): 650 mg via ORAL
  Filled 2019-01-26 (×8): qty 2

## 2019-01-26 MED ORDER — FENTANYL CITRATE (PF) 100 MCG/2ML IJ SOLN
25.0000 ug | INTRAMUSCULAR | Status: DC | PRN
  Administered 2019-01-26: 25 ug via INTRAVENOUS
  Filled 2019-01-26: qty 2

## 2019-01-26 MED ORDER — FENTANYL CITRATE (PF) 100 MCG/2ML IJ SOLN
50.0000 ug | INTRAMUSCULAR | Status: DC | PRN
  Administered 2019-01-26 – 2019-01-27 (×6): 50 ug via INTRAVENOUS
  Filled 2019-01-26 (×5): qty 2

## 2019-01-26 MED ORDER — METRONIDAZOLE IN NACL 5-0.79 MG/ML-% IV SOLN
500.0000 mg | Freq: Three times a day (TID) | INTRAVENOUS | Status: DC
  Administered 2019-01-26 – 2019-01-27 (×3): 500 mg via INTRAVENOUS
  Filled 2019-01-26 (×3): qty 100

## 2019-01-26 MED ORDER — SODIUM CHLORIDE 0.9 % IV BOLUS
500.0000 mL | Freq: Once | INTRAVENOUS | Status: AC
  Administered 2019-01-26: 500 mL via INTRAVENOUS

## 2019-01-26 MED ORDER — IOHEXOL 300 MG/ML  SOLN
100.0000 mL | Freq: Once | INTRAMUSCULAR | Status: AC | PRN
  Administered 2019-01-26: 100 mL via INTRAVENOUS

## 2019-01-26 MED ORDER — TRAMADOL HCL 50 MG PO TABS
50.0000 mg | ORAL_TABLET | Freq: Once | ORAL | Status: AC
  Administered 2019-01-26: 50 mg via ORAL
  Filled 2019-01-26: qty 1

## 2019-01-26 MED ORDER — ENOXAPARIN SODIUM 60 MG/0.6ML ~~LOC~~ SOLN: 50.0000 mg | SUBCUTANEOUS | Status: DC

## 2019-01-26 MED ORDER — SODIUM CHLORIDE 0.9 % IV BOLUS
1000.0000 mL | Freq: Once | INTRAVENOUS | Status: AC
  Administered 2019-01-26: 1000 mL via INTRAVENOUS

## 2019-01-26 MED ORDER — ENOXAPARIN SODIUM 60 MG/0.6ML ~~LOC~~ SOLN
50.0000 mg | SUBCUTANEOUS | Status: DC
  Administered 2019-01-27 – 2019-01-28 (×2): 50 mg via SUBCUTANEOUS
  Filled 2019-01-26 (×2): qty 0.6

## 2019-01-26 MED ORDER — IOHEXOL 300 MG/ML  SOLN
15.0000 mL | Freq: Once | INTRAMUSCULAR | Status: DC | PRN
  Administered 2019-01-26: 15 mL via ORAL
  Filled 2019-01-26: qty 20

## 2019-01-26 NOTE — Progress Notes (Signed)
  Echocardiogram 2D Echocardiogram has been performed.  Celene Skeen 01/26/2019, 4:12 PM

## 2019-01-26 NOTE — Consult Note (Addendum)
Cardiology Consultation:   Patient ID: Abigail Ryan; [id number]; 12-Jul-1946   Admit date: 01/25/2019 Date of Consult: 01/26/2019  Primary Care Provider: Kaleen Mask, MD Primary Cardiologist: Malvin Johns Dr Donnie Aho a few years, New, Dr Tenny Craw Primary Electrophysiologist:  None   Patient Profile:   Abigail Ryan is a 73 y.o. female with a hx of hypothyroid, nephrolithiasis, who is being seen today for the evaluation of elevated troponin at the request of Dr Rhona Leavens.  History of Present Illness:   [name] had an echo a few years ago at Dr York Spaniel office, ordered by PCP, no hx ischemic eval.  In general, she does very well. Admits she does not exercise like she should. She puts wood in the wood-heater on a daily basis. She gets SOB w/ this, has to sit down when finished. No recent change in exercise tolerance. Husband has Alzheimer's, she has to do it.  Putting wood in the wood heater is the most strenuous thing she does.  She does not climb stairs or walk long distances.  However, she does not feel limited in her activity level by as of breath.  Has stress caring for her husband, but feels she is handling it well.   She was admitted 02/17 with fever up to 105, felt septic. She was coughing last pm, not much yesterday a.m.  After admission, pt developed R lateral rib pain at the lower rib edge. It hurts only when she takes a deep breath. No other chest pain, ever.  She has abdominal tenderness now which she does not remember having yesterday and her right lower rib edge is extremely tender which started during the night.  She wonders if she has another kidney stone  She does not wake with lower extremity edema, denies orthopnea or PND.   Past Medical History:  Diagnosis Date  . Renal stones   . Thyroid disease     Past Surgical History:  Procedure Laterality Date  . ABDOMINAL HYSTERECTOMY    . APPENDECTOMY    . CHOLECYSTECTOMY       Prior to Admission medications   Medication  Sig Start Date End Date Taking? Authorizing Provider  acetaminophen (TYLENOL) 500 MG tablet Take 1,000 mg by mouth every 6 (six) hours as needed for moderate pain or fever.   Yes [provider]  Aspirin-Salicylamide-Caffeine (BC HEADACHE POWDER PO) Take 1-2 packets by mouth 2 (two) times daily as needed (pain).   Yes [provider]  cholecalciferol (VITAMIN D) 1000 UNITS tablet Take 1,500 Units by mouth daily.   Yes [provider]  ibuprofen (ADVIL,MOTRIN) 200 MG tablet Take 200 mg by mouth every 6 (six) hours as needed for mild pain.   Yes [provider]  levothyroxine (SYNTHROID, LEVOTHROID) 150 MCG tablet Take 150 mcg by mouth at bedtime.   Yes [provider]  OMEGA-3 KRILL OIL PO Take 1 capsule by mouth daily.   Yes [provider]  tamsulosin (FLOMAX) 0.4 MG CAPS capsule Take 1 capsule (0.4 mg total) by mouth daily. Patient not taking: Reported on 01/25/2019 08/24/15   Crist Fat, MD  traMADol (ULTRAM) 50 MG tablet Take 1 tablet (50 mg total) by mouth every 12 (twelve) hours as needed for severe pain. Patient not taking: Reported on 01/25/2019 08/16/15   Tomasita Crumble, MD    Inpatient Medications: Scheduled Meds: . enoxaparin (LOVENOX) injection  40 mg Subcutaneous Q24H  . levothyroxine  150 mcg Oral QHS   Continuous Infusions: .  azithromycin    . cefTRIAXone (ROCEPHIN)  IV    . lactated ringers 100 mL/hr at 01/26/19 0505   PRN Meds: acetaminophen **OR** acetaminophen, fentaNYL (SUBLIMAZE) injection, guaiFENesin-dextromethorphan, traMADol  Allergies:    Allergies  Allergen Reactions  . Codeine Nausea Only and Other (See Comments)    Extreme headaches also  . Morphine And Related Other (See Comments)    Extreme headaches    Social History:   Social History   Socioeconomic History  . Marital status: Married    Spouse name: Not on file  . Number of children: Not on file  . Years of education: Not on file  .  Highest education level: Not on file  Occupational History  . Occupation: Retired  Engineer, production  . Financial resource strain: Not on file  . Food insecurity:    Worry: Not on file    Inability: Not on file  . Transportation needs:    Medical: Not on file    Non-medical: Not on file  Tobacco Use  . Smoking status: Never Smoker  . Smokeless tobacco: Never Used  Substance and Sexual Activity  . Alcohol use: No  . Drug use: No  . Sexual activity: Not on file  Lifestyle  . Physical activity:    Days per week: Not on file    Minutes per session: Not on file  . Stress: Not on file  Relationships  . Social connections:    Talks on phone: Not on file    Gets together: Not on file    Attends religious service: Not on file    Active member of club or organization: Not on file    Attends meetings of clubs or organizations: Not on file    Relationship status: Not on file  . Intimate partner violence:    Fear of current or ex partner: Not on file    Emotionally abused: Not on file    Physically abused: Not on file    Forced sexual activity: Not on file  Other Topics Concern  . Not on file  Social History Narrative   Lives in [place] w/ husband, who has Alzheimer's    Family History:   Family History  Problem Relation Age of Onset  . Breast cancer Daughter   . Heart disease Mother 65  . Heart disease Father 75   Family Status:  Family Status  Relation Name Status  . Daughter  (Not Specified)  . Mother  Deceased  . Father  Deceased    ROS:  Please see the history of present illness.  All other ROS reviewed and negative.     Physical Exam/Data:   Vitals:   01/25/19 2328 01/26/19 0358 01/26/19 0450 01/26/19 0602  BP: (!) 106/53  131/61 (!) 119/56  Pulse: 81  92 81  Resp: 20  20 20   Temp: 99.5 F (37.5 C) (!) 101 F (38.3 C) (!) 102 F (38.9 C) 100.3 F (37.9 C)  TempSrc: Oral Oral Oral Oral  SpO2: 98%  94% 92%  Weight:      Height:        Intake/Output  Summary (Last 24 hours) at 01/26/2019 0849 Last data filed at 01/26/2019 0323 Gross per 24 hour  Intake 838.92 ml  Output 15 ml  Net 823.92 ml   Filed Weights   01/25/19 1316  Weight: 108 kg   Body mass index is 39 kg/m.  General:  Well nourished, well developed, obese female in no acute distress HEENT:  normal Lymph: no adenopathy Neck: no JVD Endocrine:  No thryomegaly Vascular: No carotid bruits; 4/4 extremity pulses 2+, without bruits  Cardiac:  normal S1, S2; RRR; no murmur  Lungs:  clear to auscultation bilaterally, no wheezing, rhonchi or rales, lateral right lower rib edge is extremely tender Abd: soft, tender, no hepatomegaly noted, bowel sounds present. Ext: no edema Musculoskeletal:  No deformities, BUE and BLE strength normal and equal Skin: warm and dry  Neuro:  CNs 2-12 intact, no focal abnormalities noted Psych:  Normal affect   EKG:  The EKG was personally reviewed and demonstrates: 2/18 ECG is sinus rhythm, heart rate 85, no acute ischemic changes, Q waves in lead II and aVF but aVF does not meet clinical significance Telemetry:  Telemetry was personally reviewed and demonstrates: Sinus rhythm, no significant ectopy  Relevant CV Studies:  ECHO: Ordered, will try to obtain previous echo results from Dr. York Spanielilley's office  Laboratory Data:  Chemistry Recent Labs  Lab 01/25/19 1431 01/26/19 0523  NA 134* 133*  K 3.8 3.9  CL 101 102  CO2 23 20*  GLUCOSE 152* 147*  BUN 13 21  CREATININE 0.90 1.01*  CALCIUM 8.8* 8.0*  GFRNONAA >60 55*  GFRAA >60 >60  ANIONGAP 10 11    Lab Results  Component Value Date   ALT 41 01/26/2019   AST 43 (H) 01/26/2019   ALKPHOS 82 01/26/2019   BILITOT 1.5 (H) 01/26/2019   Hematology Recent Labs  Lab 01/25/19 1431 01/26/19 0523  WBC 19.4* 18.1*  RBC 4.49 3.83*  HGB 13.4 11.3*  HCT 41.5 36.2  MCV 92.4 94.5  MCH 29.8 29.5  MCHC 32.3 31.2  RDW 13.4 13.7  PLT 184 162   Cardiac Enzymes Recent Labs  Lab  01/25/19 1756 01/25/19 2107 01/26/19 0000 01/26/19 0523  TROPONINI 0.72* 0.49* 0.47* 0.35*    Recent Labs  Lab 01/25/19 1444  TROPIPOC 0.40*     TSH:  Lab Results  Component Value Date   TSH 0.540 01/25/2019    Radiology/Studies:  Dg Chest 2 View  Result Date: 01/25/2019 CLINICAL DATA:  Cough and headache EXAM: CHEST - 2 VIEW COMPARISON:  02/23/2018 FINDINGS: The heart size and mediastinal contours are within normal limits. Both lungs are clear. The visualized skeletal structures are unremarkable. IMPRESSION: No active cardiopulmonary disease. Electronically Signed   By: Deatra RobinsonKevin  Herman M.D.   On: 01/25/2019 15:15   Dg Chest Port 1 View  Result Date: 01/26/2019 CLINICAL DATA:  Fever and chest pain EXAM: PORTABLE CHEST 1 VIEW COMPARISON:  01/25/2019 FINDINGS: Normal heart size. Mild aortic tortuosity. Stable appearance of the hila. There is no edema, consolidation, effusion, or pneumothorax. IMPRESSION: No evidence of pneumonia or other acute finding. Electronically Signed   By: Marnee SpringJonathon  Watts M.D.   On: 01/26/2019 05:54    Assessment and Plan:   1. Elevated troponin:  - Has a crescendo decrescendo pattern with a peak of 0.72 - However, this is in the setting of acute illness, cause unclear as chest x-ray and UA have not shown a definite cause -ECG is not acute, echo is ordered -If the echo is normal, discuss with MD if she needs an ischemic eval and if so, during this admission or as an outpatient. - Suspect that she does need an ischemic evaluation as both her parents had heart disease at about her age  Otherwise, per IM Principal Problem:   Sepsis (HCC) Active Problems:   Hypothyroid   Elevated troponin  For questions or updates, please contact [facility] Please consult [email or web address] for contact info under Cardiology/STEMI.   Signed, Theodore Demark, PA-C  01/26/2019 8:49 AM   Patient seen and examined   I agree with findings as noted by R Barrett above   Pt is a 73 yo with no known CAD    Admitted with fever 105   Pleuritic CP    WBC 18   Troponin 0.49, 0.47, 0.35  Pt currently having CT done for R sided pain She is warm   Sweaty Neck:  JVP is normal Lungs are releatively clear Cardiac RRR   N oS3   No murmurs Abd supple   Ext 2+ pulses   No edema  Trop elevation may be related to demand ischemia in setting of high fever , possible sepsis.   No prior cardiac history   I would follow     No further testing for now.  Will follow with you.  Dietrich Pates MD

## 2019-01-26 NOTE — Progress Notes (Addendum)
New sharp pain 7/10 under right rib cage. Temp 101. Too early to give tylenol. Paged Craige Cotta. New order for tylenol q4h instead of q6h and one time order for ultram 50mg  PO. Administered tylenol and ultram PO. Will continue to monitor.

## 2019-01-26 NOTE — Progress Notes (Signed)
PT. C/o 10/10 sharp constant pain at right anterior lower rib cage area. Pt. States that she got up to the bathroom and then the pain started and never stopped. EKG obtained. On call NP Craige Cotta paged and made aware. NP at bedside. Will carry out any new orders and will continue to monitor.

## 2019-01-26 NOTE — Progress Notes (Signed)
CCMD notified this RN of elevated HR 120-130s, looked like rhythm was converting to Afib, VS rechecked,  Temp increased to 102.9 after Tylenol, RR 23-26, HR then increased to 150-160s and sustained, O2 sat dropped to 89% on RA, applied oxygen and notified RRN and MD around 1051

## 2019-01-26 NOTE — Progress Notes (Signed)
Pt was up to BR and began to c/o severe pain beneath the right breast. EKG and CXR ordered. NP went to see pt. S: pain is sharp, came on all the sudden. Fentanyl took it from 10/10 to 7/10 but it is still present. Has had a cholecystectomy, appendectomy, TAH. Denies hx of a gall stone after surgery. Says she has had kidney stone before.  No n/v/d at home. Didn't have this same pain at home either. Never had colitis or bowel issues.  O: Well appearing WF in NAD. Alert, oriented, talking and smiling. Holds right lower anterior rib cage. Temp down to 100.17F. Other vitals are stable. BS + x 4 quadrants. Abd is soft. Tender to RLQ and RUQ-worse pain with palpation to RUQ. No sx of acute abdomen noted. No rebound or guarding.  A/P: 1. sepsis with unknown origin. CXR still neg for PNA. On empiric abx. Will give a dose of flagyl now for empiric tx of abdominal infection. Will defer to attending to do an abd [place] and/or Abd CT this am depending on what he thinks of exam. Rx better pain control.  KJKG, NP [facility]

## 2019-01-26 NOTE — Significant Event (Signed)
Rapid Response Event Note  Overview: Time Called: 1051 Arrival Time: 1054 Event Type: MEWS, Respiratory(Sepsis ) Notified by bedside RN in regards to patient decompensating and increase in MEWS from 3 to 5. Patient's temperature increase to 102.9 F despite tyelanol already given, HR 160s ST, O2 Sat decreased to 89% on RA, patient having difficulty breathing. Upon arrival to room patient appears not to be feeling well and drowsy but able to follow all commands. MD Rhona Leavens also paged to bedside.  Initial Focused Assessment: Neuro: Alert and oriented x 4, able to answer's appropriately and able to follow commands. Patient has no issues moving any extremities. Patient does complain of right upper abdominal pain (CT of abdomen already ordered with contrast).  Cardiac: ST-HR 106-107, BP 158/62, S1 and S2 heard upon auscultation, pulses 1-2+ in all radial and pedal locations.  Pulmonary: O2 Sats 95-96%-2LNC, RR 20-22, no identifiable respiratory distress, unlabored breathing, breath sounds clear and diminished throughout.   Interventions: MD Rhona Leavens -Ordered 1L Normal Saline Bolus -Lactic Acid Stat - increase maintenance fluids from 100-158ml/hr   Plan of Care (if not transferred): Continue to monitor patient upon telemetry unit, patient currently stable, if patient increases MEWs score again or begins to decompensate will recommend transferring to SDU. Bedside RN aware of plan. Will continue to monitor and assess.   Event Summary:   at      at          [facility]

## 2019-01-26 NOTE — Progress Notes (Signed)
PROGRESS NOTE    Abigail Ryan  GBE:[id number] DOB: July 28, 1946 DOA: 01/25/2019 PCP: Kaleen Mask, MD    Brief Narrative:  73 y.o. female with medical history significant of hypothyroid presents to ED with 1 day hx of mild productive cough, chest pains. Denies sick contacts. Reports sitting watching TV around 5pm on day prior to admit when pt suddenly felt flushed and feverish. Temp note to be as high as 105F. Fevers persisted overnight into day of admit. Initially denied cough, however later reported mild cough productive of thick sputum. Denies diarrhea or neck stiffness.  ED Course: In the ED, pt note to be febrile to 104F improved with tylenol. CXR found to be clear. UA and blood cultures were ordered, pending. Flu was found to be neg. Patient was started on empiric azithromycin and rocephin. Pt also noted to have trop of 0.4 with no ischemic changes on EKG. Cardiology consulted by EDP. Hospitalist consulted for consideration for admission. Of note, patient has since reported feeling much better after tylenol and one dose of toradol.  Assessment & Plan:   Principal Problem:   Sepsis (HCC) Active Problems:   Hypothyroid   Elevated troponin   1. Sepsis from likely intra-abdominal source 1. Patient remains febrile with tachycardia, worse this AM, prompting rapid response. See rapid response documentation. Improved with anti-pyretic and IVF bolus. HR improved 2. Reported marked pain in RUQ that started overnight which is new and not noted prior to admit 3. CT abd/pelvis with contrast ordered and has been performed, pending final result 2. Hypothyroid 1. TSH within normal limits 2. Continue with thyroid replacement 3. Elevated troponin 1. Trop peaked to 0.7, trending down 2. Seen by Cardiology. Likely demand mismatch related sepsis. No further cardiac work up recommended by Cardiology 4. Obesity 1. Seems stable at present  DVT prophylaxis: Lovenox subq Code Status:  Full Family Communication: Pt in room, family not at bedside Disposition Plan: Uncertain at this time  Consultants:   Cardiology  Procedures:     Antimicrobials: Anti-infectives (From admission, onward)   Start     Dose/Rate Route Frequency Ordered Stop   01/26/19 1800  cefTRIAXone (ROCEPHIN) 2 g in sodium chloride 0.9 % 100 mL IVPB  Status:  Discontinued     2 g 200 mL/hr over 30 Minutes Intravenous Every 24 hours 01/25/19 1820 01/26/19 0928   01/26/19 1800  azithromycin (ZITHROMAX) 500 mg in sodium chloride 0.9 % 250 mL IVPB  Status:  Discontinued     500 mg 250 mL/hr over 60 Minutes Intravenous Every 24 hours 01/25/19 1820 01/26/19 0928   01/26/19 1400  Ampicillin-Sulbactam (UNASYN) 3 g in sodium chloride 0.9 % 100 mL IVPB     3 g 200 mL/hr over 30 Minutes Intravenous Every 6 hours 01/26/19 0944     01/26/19 0615  metroNIDAZOLE (FLAGYL) IVPB 500 mg     500 mg 100 mL/hr over 60 Minutes Intravenous Once 01/26/19 0612 01/26/19 0728   01/25/19 1715  cefTRIAXone (ROCEPHIN) 2 g in sodium chloride 0.9 % 100 mL IVPB  Status:  Discontinued     2 g 200 mL/hr over 30 Minutes Intravenous Every 24 hours 01/25/19 1714 01/25/19 2036   01/25/19 1715  azithromycin (ZITHROMAX) 500 mg in sodium chloride 0.9 % 250 mL IVPB  Status:  Discontinued     500 mg 250 mL/hr over 60 Minutes Intravenous Every 24 hours 01/25/19 1714 01/25/19 2042       Subjective: Complaining of worse RUQ pain that  started last night  Objective: Vitals:   01/26/19 1130 01/26/19 1142 01/26/19 1256 01/26/19 1410  BP: (!) 148/59 138/60 (!) 129/50 (!) 133/57  Pulse: (!) 102 (!) 101 91 88  Resp: (!) 21 20 (!) 22   Temp:  (!) 100.8 F (38.2 C) 99 F (37.2 C) (!) 100.8 F (38.2 C)  TempSrc:  Oral Oral Oral  SpO2: 96% 96% 97% 97%  Weight:      Height:        Intake/Output Summary (Last 24 hours) at 01/26/2019 1437 Last data filed at 01/26/2019 0323 Gross per 24 hour  Intake 838.92 ml  Output 15 ml  Net 823.92  ml   Filed Weights   01/25/19 1316  Weight: 108 kg    Examination:  General exam: Appears calm and comfortable  Respiratory system: Clear to auscultation. Respiratory effort normal. Cardiovascular system: S1 & S2 heard, tachycardic Gastrointestinal system: Abdomen is nondistended, soft, RUQ tenderness. Central nervous system: Alert and oriented. No focal neurological deficits. Extremities: Symmetric 5 x 5 power. Skin: No rashes, lesions Psychiatry: Judgement and insight appear normal. Mood & affect appropriate.   Data Reviewed: I have personally reviewed following labs and imaging studies  CBC: Recent Labs  Lab 01/25/19 1431 01/26/19 0523  WBC 19.4* 18.1*  NEUTROABS 16.6*  --   HGB 13.4 11.3*  HCT 41.5 36.2  MCV 92.4 94.5  PLT 184 162   Basic Metabolic Panel: Recent Labs  Lab 01/25/19 1431 01/26/19 0523  NA 134* 133*  K 3.8 3.9  CL 101 102  CO2 23 20*  GLUCOSE 152* 147*  BUN 13 21  CREATININE 0.90 1.01*  CALCIUM 8.8* 8.0*   GFR: Estimated Creatinine Clearance: 60.3 mL/min (A) (by C-G formula based on SCr of 1.01 mg/dL (H)). Liver Function Tests: Recent Labs  Lab 01/26/19 0523  AST 43*  ALT 41  ALKPHOS 82  BILITOT 1.5*  PROT 6.4*  ALBUMIN 3.1*   No results for input(s): LIPASE, AMYLASE in the last 168 hours. No results for input(s): AMMONIA in the last 168 hours. Coagulation Profile: No results for input(s): INR, PROTIME in the last 168 hours. Cardiac Enzymes: Recent Labs  Lab 01/25/19 1756 01/25/19 2107 01/26/19 0000 01/26/19 0523  TROPONINI 0.72* 0.49* 0.47* 0.35*   BNP (last 3 results) No results for input(s): PROBNP in the last 8760 hours. HbA1C: No results for input(s): HGBA1C in the last 72 hours. CBG: No results for input(s): GLUCAP in the last 168 hours. Lipid Profile: No results for input(s): CHOL, HDL, LDLCALC, TRIG, CHOLHDL, LDLDIRECT in the last 72 hours. Thyroid Function Tests: Recent Labs    01/25/19 2107  TSH 0.540    Anemia Panel: No results for input(s): VITAMINB12, FOLATE, FERRITIN, TIBC, IRON, RETICCTPCT in the last 72 hours. Sepsis Labs: Recent Labs  Lab 01/25/19 1431 01/26/19 1129 01/26/19 1359  LATICACIDVEN 1.2 1.1 1.3    Recent Results (from the past 240 hour(s))  Respiratory Panel by PCR     Status: None   Collection Time: 01/25/19  2:31 PM  Result Value Ref Range Status   Adenovirus NOT DETECTED NOT DETECTED Final   Coronavirus 229E NOT DETECTED NOT DETECTED Final    Comment: (NOTE) The Coronavirus on the Respiratory Panel, DOES NOT test for the novel  Coronavirus (2019 nCoV)    Coronavirus HKU1 NOT DETECTED NOT DETECTED Final   Coronavirus NL63 NOT DETECTED NOT DETECTED Final   Coronavirus OC43 NOT DETECTED NOT DETECTED Final   Metapneumovirus NOT DETECTED  NOT DETECTED Final   Rhinovirus / Enterovirus NOT DETECTED NOT DETECTED Final   Influenza A NOT DETECTED NOT DETECTED Final   Influenza B NOT DETECTED NOT DETECTED Final   Parainfluenza Virus 1 NOT DETECTED NOT DETECTED Final   Parainfluenza Virus 2 NOT DETECTED NOT DETECTED Final   Parainfluenza Virus 3 NOT DETECTED NOT DETECTED Final   Parainfluenza Virus 4 NOT DETECTED NOT DETECTED Final   Respiratory Syncytial Virus NOT DETECTED NOT DETECTED Final   Bordetella pertussis NOT DETECTED NOT DETECTED Final   Chlamydophila pneumoniae NOT DETECTED NOT DETECTED Final   Mycoplasma pneumoniae NOT DETECTED NOT DETECTED Final    Comment: Performed at [facility], [address] [place], [place] [address]  Blood Culture (routine x 2)     Status: None (Preliminary result)   Collection Time: 01/25/19  5:50 PM  Result Value Ref Range Status   Specimen Description   Final    BLOOD LEFT FOREARM Performed at [facility], [address] [place], [place] [address]    Special Requests   Final    BOTTLES DRAWN AEROBIC AND ANAEROBIC Blood Culture adequate volume Performed at [facility],  [address] [place], [place] [address]    Culture   Final    NO GROWTH < 24 HOURS Performed at [facility], [address] [place], [place] [address]    Report Status PENDING  Incomplete  Blood Culture (routine x 2)     Status: None (Preliminary result)   Collection Time: 01/25/19  5:54 PM  Result Value Ref Range Status   Specimen Description   Final    BLOOD RIGHT ANTECUBITAL Performed at [facility], [address] [place], [place] [address]    Special Requests   Final    BOTTLES DRAWN AEROBIC AND ANAEROBIC Blood Culture adequate volume Performed at [facility], [address] [place], [place] [address]    Culture   Final    NO GROWTH < 24 HOURS Performed at [facility], [address] [place], [place] [address]    Report Status PENDING  Incomplete     Radiology Studies: Dg Chest 2 View  Result Date: 01/25/2019 CLINICAL DATA:  Cough and headache EXAM: CHEST - 2 VIEW COMPARISON:  02/23/2018 FINDINGS: The heart size and mediastinal contours are within normal limits. Both lungs are clear. The visualized skeletal structures are unremarkable. IMPRESSION: No active cardiopulmonary disease. Electronically Signed   By: Deatra RobinsonKevin  Herman M.D.   On: 01/25/2019 15:15   Dg Chest Port 1 View  Result Date: 01/26/2019 CLINICAL DATA:  Fever and chest pain EXAM: PORTABLE CHEST 1 VIEW COMPARISON:  01/25/2019 FINDINGS: Normal heart size. Mild aortic tortuosity. Stable appearance of the hila. There is no edema, consolidation, effusion, or pneumothorax. IMPRESSION: No evidence of pneumonia or other acute finding. Electronically Signed   By: Marnee SpringJonathon  Watts M.D.   On: 01/26/2019 05:54    Scheduled Meds: . enoxaparin (LOVENOX) injection  50 mg Subcutaneous Q24H  . levothyroxine  150 mcg Oral QHS  . sodium chloride (PF)       Continuous Infusions: . ampicillin-sulbactam (UNASYN) IV 3 g (01/26/19 1059)  . lactated ringers 125 mL/hr at  01/26/19 1124     LOS: 1 day   Rickey BarbaraStephen Delanda Bulluck, MD [facility] Pager On Amion  If 7PM-7AM, please contact night-coverage 01/26/2019, 2:37 PM

## 2019-01-26 NOTE — Progress Notes (Addendum)
Patient's sharp pain under right rib has increased to 9/10 and fever increased to 102. Paged Craige Cotta. Multiple new orders in Epic. Will continue to monitor.

## 2019-01-26 NOTE — Progress Notes (Signed)
Pharmacy Antibiotic Note  Abigail Ryan is a 73 y.o. female admitted on 01/25/2019 with sepsis of unclear source.  Pharmacy has been consulted for Unasyn dosing.  Abdominal CT ordered.  Plan: Unasyn 3g IV q6h No dose adjustments needed, Pharmacy will sign off  Height: 5' 5.5" (166.4 cm) Weight: 238 lb (108 kg) IBW/kg (Calculated) : 58.15  Temp (24hrs), Avg:100.7 F (38.2 C), Min:98.2 F (36.8 C), Max:104.4 F (40.2 C)  Recent Labs  Lab 01/25/19 1431 01/26/19 0523  WBC 19.4* 18.1*  CREATININE 0.90 1.01*  LATICACIDVEN 1.2  --     Estimated Creatinine Clearance: 60.3 mL/min (A) (by C-G formula based on SCr of 1.01 mg/dL (H)).    Allergies  Allergen Reactions  . Codeine Nausea Only and Other (See Comments)    Extreme headaches also  . Morphine And Related Other (See Comments)    Extreme headaches    Antimicrobials this admission: 2/17 Rocephin x 1 2/17 Azithro x 1 2/18 Flagyl x 1 2/18 Unasyn >>  Dose adjustments this admission: None  Microbiology results: 2/17 Respiratory panel: neg 2/17 Influenza A/B: neg/neg 2/17 BCx: sent  Thank you for allowing pharmacy to be a part of this patient's care.  Loralee Pacas, PharmD, BCPS Pager: [phone] 01/26/2019 10:25 AM

## 2019-01-27 ENCOUNTER — Inpatient Hospital Stay (HOSPITAL_COMMUNITY): Payer: Medicare Other

## 2019-01-27 DIAGNOSIS — E039 Hypothyroidism, unspecified: Secondary | ICD-10-CM

## 2019-01-27 DIAGNOSIS — K750 Abscess of liver: Secondary | ICD-10-CM

## 2019-01-27 DIAGNOSIS — I4891 Unspecified atrial fibrillation: Secondary | ICD-10-CM | POA: Diagnosis present

## 2019-01-27 LAB — COMPREHENSIVE METABOLIC PANEL
ALT: 76 U/L — ABNORMAL HIGH (ref 0–44)
AST: 62 U/L — ABNORMAL HIGH (ref 15–41)
Albumin: 2.7 g/dL — ABNORMAL LOW (ref 3.5–5.0)
Alkaline Phosphatase: 87 U/L (ref 38–126)
Anion gap: 7 (ref 5–15)
BUN: 18 mg/dL (ref 8–23)
CO2: 24 mmol/L (ref 22–32)
Calcium: 8 mg/dL — ABNORMAL LOW (ref 8.9–10.3)
Chloride: 106 mmol/L (ref 98–111)
Creatinine, Ser: 0.93 mg/dL (ref 0.44–1.00)
GFR calc Af Amer: >60 mL/min (ref >60)
GFR calc non Af Amer: >60 mL/min (ref >60)
Glucose, Bld: 139 mg/dL — ABNORMAL HIGH (ref 70–99)
Potassium: 4.1 mmol/L (ref 3.5–5.1)
SODIUM: 137 mmol/L (ref 135–145)
Total Bilirubin: 1.3 mg/dL — ABNORMAL HIGH (ref 0.3–1.2)
Total Protein: 5.9 g/dL — ABNORMAL LOW (ref 6.5–8.1)

## 2019-01-27 LAB — CBC
HCT: 34.2 % — ABNORMAL LOW (ref 36.0–46.0)
HEMOGLOBIN: 10.6 g/dL — AB (ref 12.0–15.0)
MCH: 29 pg (ref 26.0–34.0)
MCHC: 31 g/dL (ref 30.0–36.0)
MCV: 93.7 fL (ref 80.0–100.0)
Platelets: 150 10*3/uL (ref 150–400)
RBC: 3.65 MIL/uL — AB (ref 3.87–5.11)
RDW: 14.2 % (ref 11.5–15.5)
WBC: 14.4 10*3/uL — ABNORMAL HIGH (ref 4.0–10.5)
nRBC: 0 % (ref 0.0–0.2)

## 2019-01-27 LAB — PROTIME-INR
INR: 1.45
Prothrombin Time: 17.5 seconds — ABNORMAL HIGH (ref 11.4–15.2)

## 2019-01-27 MED ORDER — BENZONATATE 100 MG PO CAPS
100.0000 mg | ORAL_CAPSULE | Freq: Three times a day (TID) | ORAL | Status: DC | PRN
  Administered 2019-01-27 – 2019-01-31 (×11): 100 mg via ORAL
  Filled 2019-01-27 (×11): qty 1

## 2019-01-27 MED ORDER — DILTIAZEM HCL 100 MG IV SOLR
5.0000 mg/h | INTRAVENOUS | Status: DC
  Administered 2019-01-27: 5 mg/h via INTRAVENOUS
  Filled 2019-01-27: qty 100

## 2019-01-27 MED ORDER — MIDAZOLAM HCL 2 MG/2ML IJ SOLN
INTRAMUSCULAR | Status: AC | PRN
  Administered 2019-01-27: 1 mg via INTRAVENOUS

## 2019-01-27 MED ORDER — DILTIAZEM HCL 25 MG/5ML IV SOLN
5.0000 mg | Freq: Once | INTRAVENOUS | Status: AC
  Administered 2019-01-27: 5 mg via INTRAVENOUS
  Filled 2019-01-27: qty 5

## 2019-01-27 MED ORDER — METOPROLOL TARTRATE 5 MG/5ML IV SOLN
5.0000 mg | Freq: Four times a day (QID) | INTRAVENOUS | Status: DC
  Administered 2019-01-28 (×2): 5 mg via INTRAVENOUS
  Filled 2019-01-27 (×2): qty 5

## 2019-01-27 MED ORDER — METOPROLOL TARTRATE 25 MG PO TABS
25.0000 mg | ORAL_TABLET | Freq: Two times a day (BID) | ORAL | Status: DC
  Administered 2019-01-27 – 2019-01-28 (×3): 25 mg via ORAL
  Filled 2019-01-27 (×3): qty 1

## 2019-01-27 MED ORDER — GADOBUTROL 1 MMOL/ML IV SOLN
10.0000 mL | Freq: Once | INTRAVENOUS | Status: AC | PRN
  Administered 2019-01-27: 10 mL via INTRAVENOUS

## 2019-01-27 MED ORDER — FENTANYL CITRATE (PF) 100 MCG/2ML IJ SOLN
INTRAMUSCULAR | Status: AC | PRN
  Administered 2019-01-27: 50 ug via INTRAVENOUS

## 2019-01-27 MED ORDER — LIDOCAINE HCL 1 % IJ SOLN
INTRAMUSCULAR | Status: AC
  Administered 2019-01-27: 17:00:00
  Filled 2019-01-27: qty 20

## 2019-01-27 MED ORDER — FENTANYL CITRATE (PF) 100 MCG/2ML IJ SOLN
INTRAMUSCULAR | Status: AC
  Administered 2019-01-27: 50 ug via INTRAVENOUS
  Filled 2019-01-27: qty 2

## 2019-01-27 MED ORDER — MIDAZOLAM HCL 2 MG/2ML IJ SOLN
INTRAMUSCULAR | Status: AC
  Filled 2019-01-27: qty 2

## 2019-01-27 MED ORDER — DILTIAZEM HCL-DEXTROSE 100-5 MG/100ML-% IV SOLN (PREMIX)
5.0000 mg/h | INTRAVENOUS | Status: DC
  Filled 2019-01-27: qty 100

## 2019-01-27 NOTE — Consult Note (Signed)
Chief Complaint: Patient was seen in consultation today for image guided aspiration and possible drainage of hepatic mass/? abscess Chief Complaint  Patient presents with  . Cough  . Generalized Body Aches  . Fever    Referring Physician(s): Chiu,S   Supervising Physician: Richarda OverlieHenn, Adam  [facility]  History of Present Illness: Abigail Ryan is a 73 y.o. female who [facility] with persistent cough, fever, abdominal discomfort.  WBC currently 14.4, subsequent imaging has now revealed a round septated lesion in the right hepatic lobe with peripheral rim enhancement consistent with hepatic abscess.  Request now received from primary service for image guided aspiration and possible drainage of the hepatic mass/abscess.  Past Medical History:  Diagnosis Date  . Renal stones   . Thyroid disease     Past Surgical History:  Procedure Laterality Date  . ABDOMINAL HYSTERECTOMY    . APPENDECTOMY    . CHOLECYSTECTOMY      Allergies: Codeine and Morphine and related  Medications: Prior to Admission medications   Medication Sig Start Date End Date Taking? Authorizing Provider  acetaminophen (TYLENOL) 500 MG tablet Take 1,000 mg by mouth every 6 (six) hours as needed for moderate pain or fever.   Yes [provider]  Aspirin-Salicylamide-Caffeine (BC HEADACHE POWDER PO) Take 1-2 packets by mouth 2 (two) times daily as needed (pain).   Yes [provider]  cholecalciferol (VITAMIN D) 1000 UNITS tablet Take 1,500 Units by mouth daily.   Yes [provider]  ibuprofen (ADVIL,MOTRIN) 200 MG tablet Take 200 mg by mouth every 6 (six) hours as needed for mild pain.   Yes [provider]  levothyroxine (SYNTHROID, LEVOTHROID) 150 MCG tablet Take 150 mcg by mouth at bedtime.   Yes [provider]  OMEGA-3 KRILL OIL PO Take 1 capsule by mouth daily.   Yes [provider]  tamsulosin (FLOMAX)  0.4 MG CAPS capsule Take 1 capsule (0.4 mg total) by mouth daily. Patient not taking: Reported on 01/25/2019 08/24/15   Crist FatHerrick, Benjamin W, MD  traMADol (ULTRAM) 50 MG tablet Take 1 tablet (50 mg total) by mouth every 12 (twelve) hours as needed for severe pain. Patient not taking: Reported on 01/25/2019 08/16/15   Tomasita Crumbleni, Adeleke, MD     Family History  Problem Relation Age of Onset  . Breast cancer Daughter   . Heart disease Mother 6080  . Heart disease Father 2079    Social History   Socioeconomic History  . Marital status: Married    Spouse name: Not on file  . Number of children: Not on file  . Years of education: Not on file  . Highest education level: Not on file  Occupational History  . Occupation: Retired  Engineer, productionocial Needs  . Financial resource strain: Not on file  . Food insecurity:    Worry: Not on file    Inability: Not on file  . Transportation needs:    Medical: Not on file    Non-medical: Not on file  Tobacco Use  . Smoking status: Never Smoker  . Smokeless tobacco: Never Used  Substance and Sexual Activity  . Alcohol use: No  . Drug use: No  . Sexual activity: Not on file  Lifestyle  . Physical activity:    Days per week: Not on file    Minutes per session: Not on file  . Stress: Not on file  Relationships  . Social connections:  Talks on phone: Not on file    Gets together: Not on file    Attends religious service: Not on file    Active member of club or organization: Not on file    Attends meetings of clubs or organizations: Not on file    Relationship status: Not on file  Other Topics Concern  . Not on file  Social History Narrative   Lives in [place] w/ husband, who has Alzheimer's     Review of Systems see above;  denies headache, substernal chest pain, nausea, vomiting or bleeding.  Vital Signs: BP (!) 143/69 (BP Location: Left Arm)   Pulse 79   Temp (!) 100.8 F (38.2 C) (Oral)   Resp 20   Ht 5' 5.5" (1.664 m)   Wt 238 lb (108 kg)   SpO2  97%   BMI 39.00 kg/m   Physical Exam awake, alert.  Chest with slight diminished breath sounds right base, left clear.  Heart with a rate, irregular rhythm.  Abdomen obese, soft, positive bowel sounds, tender epigastric, right upper/right lower regions to palpation.  No lower extremity edema.  Imaging: Dg Chest 2 View  Result Date: 01/25/2019 CLINICAL DATA:  Cough and headache EXAM: CHEST - 2 VIEW COMPARISON:  02/23/2018 FINDINGS: The heart size and mediastinal contours are within normal limits. Both lungs are clear. The visualized skeletal structures are unremarkable. IMPRESSION: No active cardiopulmonary disease. Electronically Signed   By: Deatra Robinson M.D.   On: 01/25/2019 15:15   Ct Abdomen Pelvis W Contrast  Result Date: 01/26/2019 CLINICAL DATA:  Abdominal tenderness. Right lateral rib pain with inspiration. Fever. EXAM: CT ABDOMEN AND PELVIS WITH CONTRAST TECHNIQUE: Multidetector CT imaging of the abdomen and pelvis was performed using the standard protocol following bolus administration of intravenous contrast. CONTRAST:  OMNIPAQUE IOHEXOL 300 MG/ML SOLN, 57mL OMNIPAQUE IOHEXOL 300 MG/ML SOLN COMPARISON:  08/16/2015 FINDINGS: Lower chest: Fluid density lesion adjacent to the right inferior pulmonary vein is stable since prior study and comparing back to an exam from 10/14/2014. This measures water attenuation and is compatible with a benign cystic lesion, potentially right pulmonary venous recess or pericardial cyst. Airspace opacity in the posterior right costophrenic sulcus may be related to atelectasis although infection can not be excluded. Hepatobiliary: 4.1 x 4.0 cm ill-defined low-density lesion is identified in the right liver (26/2). 5 mm hypoattenuating subcapsular lesion in the lateral segment left liver is stable since prior study. Subtle nodularity of liver contour is associated with prominent lateral segment left liver, raising the question of cirrhosis. Gallbladder  surgically absent. No intrahepatic or extrahepatic biliary dilation. Pancreas: No focal mass lesion. No dilatation of the main duct. No intraparenchymal cyst. No peripancreatic edema. Spleen: No splenomegaly. No focal mass lesion. Adrenals/Urinary Tract: No adrenal nodule or mass. Kidneys unremarkable. No evidence for hydroureter. The urinary bladder appears normal for the degree of distention. Stomach/Bowel: Tiny hiatal hernia. Stomach otherwise unremarkable. Duodenum is normally positioned as is the ligament of Treitz. No small bowel wall thickening. No small bowel dilatation. The terminal ileum is normal. The appendix is not visualized, but there is no edema or inflammation in the region of the cecum. No gross colonic mass. No colonic wall thickening. Diverticular changes are noted in the left colon without evidence of diverticulitis. Vascular/Lymphatic: There is abdominal aortic atherosclerosis without aneurysm. 10 mm short axis upper normal hepato duodenal ligament lymph node is not substantially changed in the interval. Other upper normal lymph nodes in the hepato duodenal ligament  are also stable. Tiny gastrohepatic ligament lymph nodes are unchanged. No retroperitoneal lymphadenopathy 7 mm pericaval lymph node in the upper pelvis is stable. No pelvic sidewall lymphadenopathy. Reproductive: Uterus surgically absent.  There is no adnexal mass. Other: No intraperitoneal free fluid. Musculoskeletal: No worrisome lytic or sclerotic osseous abnormality. IMPRESSION: 1. 4.1 x 4.0 cm ill-defined low-density lesion in the peripheral right liver. Given the history of high fever and right abdominal/rib pain, abscess would be a distinct consideration. No diverticulitis or other etiology identified to account for potential abscess. Primary hepatic neoplasm and metastatic disease not excluded. Tissue sampling may be warranted. Liver MRI without and with contrast might provide additional useful information period. 2. Subtle  nodularity of liver contour associated with asymmetric enlargement lateral segment left liver. Imaging features raise concern for underlying cirrhosis. Stable borderline lymphadenopathy in the hepato duodenal ligament likely reactive. 3. Airspace disease in the posterior right costophrenic sulcus may be atelectatic, but pneumonia not excluded. 4. Left colonic diverticulosis without diverticulitis. 5.  Aortic Atherosclerois (ICD10-170.0) 6. These results will be called to the ordering clinician or representative by the Radiologist Assistant, and communication documented in the PACS or zVision Dashboard. Electronically Signed   By: Kennith CenterEric  Mansell M.D.   On: 01/26/2019 15:01   Mr Liver W Wo Contrast  Result Date: 01/27/2019 CLINICAL DATA:  Evaluate possible liver abscess. Fever. Abdominal tenderness EXAM: MRI ABDOMEN WITHOUT AND WITH CONTRAST TECHNIQUE: Multiplanar multisequence MR imaging of the abdomen was performed both before and after the administration of intravenous contrast. CONTRAST:  10 mL Gadavist COMPARISON:  CT 01/26/2019, 08/16/2015 FINDINGS: Lower chest:  Lung bases are clear. Hepatobiliary: Within the RIGHT hepatic lobe, there is a rounded lesion which is mixed high signal intensity on T2 weighted imaging measuring 5.0 x 4.8 cm (image 31/3). There is a rim of more diffuse high signal intensity around the lesion suggesting edema within the liver parenchyma. On precontrast T1 weighted imaging lesion is hypointense. On postcontrast imaging there is thin peripheral enhancing rim and enhancing internal septations. The majority of the internal aspect of the lesion is nonenhancing (image series 1002 and 1003). The peripheral rim is mildly enhancing. On diffusion-weighted imaging, lesion is hyperintense and mildly hypointense on the ADC map. No biliary duct dilatation. Postcholecystectomy. Normal common bile duct. Pancreas: Normal pancreatic parenchymal intensity. No ductal dilatation or inflammation.  Spleen: Normal spleen. Adrenals/urinary tract: Adrenal glands and kidneys are normal. Stomach/Bowel: Stomach and limited of the small bowel is unremarkable Vascular/Lymphatic: Abdominal aortic normal caliber. No retroperitoneal periportal lymphadenopathy. Musculoskeletal: No aggressive osseous lesion IMPRESSION: Round septated lesion in the RIGHT hepatic lobe with peripheral rim of enhancing edema is most consistent with a HEPATIC ABSCESS. Less favored differential would include a cystic neoplasm or necrotic lesion such is biliary cystadenoma, mucinous lesion, or necrotic metastasis. Electronically Signed   By: Genevive BiStewart  Edmunds M.D.   On: 01/27/2019 13:58   Dg Chest Port 1 View  Result Date: 01/26/2019 CLINICAL DATA:  Fever and chest pain EXAM: PORTABLE CHEST 1 VIEW COMPARISON:  01/25/2019 FINDINGS: Normal heart size. Mild aortic tortuosity. Stable appearance of the hila. There is no edema, consolidation, effusion, or pneumothorax. IMPRESSION: No evidence of pneumonia or other acute finding. Electronically Signed   By: Marnee SpringJonathon  Watts M.D.   On: 01/26/2019 05:54    Labs:  CBC: Recent Labs    01/25/19 1431 01/26/19 0523 01/27/19 0610  WBC 19.4* 18.1* 14.4*  HGB 13.4 11.3* 10.6*  HCT 41.5 36.2 34.2*  PLT 184 162 150  COAGS: Recent Labs    01/27/19 0610  INR 1.45    BMP: Recent Labs    01/25/19 1431 01/26/19 0523 01/27/19 0610  NA 134* 133* 137  K 3.8 3.9 4.1  CL 101 102 106  CO2 23 20* 24  GLUCOSE 152* 147* 139*  BUN 13 21 18   CALCIUM 8.8* 8.0* 8.0*  CREATININE 0.90 1.01* 0.93  GFRNONAA >60 55* >60  GFRAA >60 >60 >60    LIVER FUNCTION TESTS: Recent Labs    01/26/19 0523 01/27/19 0610  BILITOT 1.5* 1.3*  AST 43* 62*  ALT 41 76*  ALKPHOS 82 87  PROT 6.4* 5.9*  ALBUMIN 3.1* 2.7*    TUMOR MARKERS: No results for input(s): AFPTM, CEA, CA199, CHROMGRNA in the last 8760 hours.  Assessment and Plan: 73 y.o. female who [facility] to [facility] on 2/17  with persistent cough, fever, abdominal discomfort.  WBC currently 14.4, subsequent imaging has now revealed a round septated lesion in the right hepatic lobe with peripheral rim enhancement consistent with hepatic abscess.  Request now received from primary service for image guided aspiration and possible drainage of the hepatic mass/abscess.  Imaging studies have been reviewed by Dr. Lowella Dandy.Risks and benefits discussed with the patient including bleeding, infection, damage to adjacent structures, bowel perforation/fistula connection, and sepsis.  All of the patient's questions were answered, patient is agreeable to proceed. Consent signed and in chart.  Procedure scheduled for this afternoon.   Thank you for this interesting consult.  I greatly enjoyed meeting Abigail Ryan and look forward to participating in their care.  A copy of this report was sent to the requesting provider on this date.  Electronically Signed: D. Jeananne Rama, PA-C 01/27/2019, 4:26 PM   I spent a total of 25 minutes    in face to face in clinical consultation, greater than 50% of which was counseling/coordinating care for image guided aspiration/possible drainage of hepatic mass/abscess

## 2019-01-27 NOTE — Progress Notes (Addendum)
0427am - Patient's HR increased to 130s and sustained. Patient's EKG showed Afib w/RVR. Patient has no c/o pain but has slight shortness of breath. Recently administered PO tylenol and ultram and IV fentanyl. Paged Craige Cotta. Paged RR RN. New order for 5mg  IV cardizem ordered and given. Craige Cotta asked if to call after to report changes.   Paged Kirby 30 minutes post cardizem. HR down to 120s Afib with RVR. BP was 120/65 MAP 79. Kirby orderd additional 5mg  IV cardizem and given. Will continue to monitor.   0545 -HR down to between 108 and 120s after second 5 mg IV cardizem  BP 101/72 MAP 82. Paged Craige Cotta with results. Will continue to monitor.   0545 am - MRI called and stated they were ready to do MRI before second dose of IV cardizem. Paged Craige Cotta after HR came down to 110s/low 120s. Craige Cotta stated okay for patient to get MRI at this time. Called MRI back and they stated they couldn't do patient until 10:30am. Will continue to monitor.   650am -HR now between 120s and 130s afib w/ RVR.  Paged Craige Cotta. New order for cardizem drip. Will continue to monitor.

## 2019-01-27 NOTE — Procedures (Signed)
Interventional Radiology Procedure:   Indications: Indeterminate liver lesion with fevers and elevated WBC  Procedure: US guided liver lesion aspiration  Findings: 4 ml of thick bloody fluid aspirated  Complications: None     EBL: Minimal  Plan: Bedrest 3 hours.  Send fluid for culture and cytology.    Abigail Ryan R. Lowella Dandy, MD  Pager: [phone]

## 2019-01-27 NOTE — Progress Notes (Signed)
PROGRESS NOTE    Abigail Evalice B Summa  ZOX:[id number] DOB: 1945/12/16 DOA: 01/25/2019 PCP: Kaleen MaskElkins, Wilson Oliver, MD   Chief complaint: Fever   Brief Narrative:   Abigail Ryan is a 73 y.o. female with medical history significant of hypothyroid presents to ED with 1 day hx of mild productive cough, chest pains. Denies sick contacts. Reports sitting watching TV around 5pm on day prior to admit when pt suddenly felt flushed and feverish. Temp note to be as high as 105F. Fevers persisted overnight into day of admit. Initially denied cough, however later reported mild cough productive of thick sputum. Denies diarrhea or neck stiffness.  ED Course: In the ED, pt note to be febrile to 104F improved with tylenol. CXR found to be clear. UA and blood cultures were ordered, pending. Flu was found to be neg. Patient was started on empiric azithromycin and rocephin. Pt also noted to have trop of 0.4 with no ischemic changes on EKG. Cardiology consulted by EDP. Hospitalist consulted for consideration for admission. Of note, patient has since reported feeling much better after tylenol and one dose of toradol.   Assessment & Plan:   Principal Problem:   Sepsis (HCC) Active Problems:   Hypothyroid   Elevated troponin   Atrial fibrillation with RVR (HCC)   Liver abscess   Sepsis; POA Liver lesion concerning for abscess vs mass Patient presents with fever up to 105 at home with right upper quadrant pain.  CT abdomen/pelvis notable for a 4.1 x 4.0 low density lesion peripheral right liver consistent with possible abscess.  Also noted nodular texture to the liver consistent with possible cirrhosis.  Total bilirubin elevated 1.5 with AST of 43 and ALT of 41.  Patient was febrile to 104.0 in the ED with a white blood cell count of 18.1. --MRI abdomen pending; may need IR drain/aspiration versus biopsy --Lactic acid 1.3 --WBC trending down 18.1-->14.4 --Now afebrile --Blood cultures x2 01/25/2019:  Pending --Continue antibiotics with Unasyn 3 g IV q6h and Flagyl 500 mg IV q8h  Paroxysmal atrial fibrillation with RVR Patient was noted to have elevated heart rate on admission with EKG notable for A. fib with RVR.  No previous history.  Likely exacerbated by acute infectious process/pain. --Cardiology following, appreciate assistance --TTE 2/18: EF 55-60%, no evidence LV wall motion abnormalities --CHA2DS2/VASC = 2 (age and gender) --TSH 0.540; wnL --Started on a Cardizem drip; continue to titrate off --Cardiology starting metoprolol tartrate 25 mg p.o. twice daily for rate control --Holding off anticoagulation given need for biopsy/drain for liver lesion as above --Continue to monitor on telemetry  Elevated troponin Etiology likely demand ischemia in the setting of sepsis and A. fib with RVR as above. --Troponin flat/trending down; 0.72-->0.49-->0.47-->0.35 --Continue antibiotics for likely liver abscess and rate control as above --Continue to monitor on telemetry  Hypothyroidism TSH within normal range, 0.540.   --Continue Synthroid 150 mcg p.o. daily.  DVT prophylaxis: Lovenox Code Status: Full code Family Communication: None Disposition Plan: Continue inpatient level of care   Consultants:   Cardiology  Interventional radiology  Procedures:   TTE 01/26/2019: 1. The left ventricle has normal systolic function, with an ejection fraction of 55-60%. The cavity size was normal. Mild asymmetric septal hypertrophy. Left ventricular diastolic parameters were normal No evidence of left ventricular regional wall  motion abnormalities.  2. The right ventricle has normal systolic function. The cavity was mildly enlarged. There is no increase in right ventricular wall thickness. Right ventricular systolic pressure could not  be assessed.  3. The mitral valve is normal in structure. There is mild mitral annular calcification present.  4. The tricuspid valve is normal in structure.   5. The aortic valve is tricuspid.  6. The inferior vena cava was dilated in size with >50% respiratory variability.  7. No evidence of left ventricular regional wall motion abnormalities.  Antimicrobials:   Metronidazole 2/17>>  Unasyn 2/17>>   Subjective: Patient seen and examined at bedside, resting comfortably in bed.  Continues with right upper quadrant tenderness.  Fever improving.  Continues on IV antibiotics.  Awaiting MRI abdomen for further evaluation of liver lesion.  Continues with elevated heart rate secondary to A. fib with RVR, now on Cardizem drip.  No other complaints at this time.  Denies headache, no visual changes, no nausea/vomiting/diarrhea, no chest pain, no palpitations, no chills/night sweats, no cough/congestion, no issues with bowel/bladder function, no paresthesias.  No acute concerns overnight per nursing staff.  Objective: Vitals:   01/27/19 0901 01/27/19 0948 01/27/19 1041 01/27/19 1217  BP: 132/68 121/72 97/61 (!) 143/69  Pulse: (!) 131 (!) 124 77 79  Resp:    20  Temp:    (!) 100.8 F (38.2 C)  TempSrc:    Oral  SpO2: 98%   97%  Weight:      Height:        Intake/Output Summary (Last 24 hours) at 01/27/2019 1315 Last data filed at 01/27/2019 1000 Gross per 24 hour  Intake 5062.84 ml  Output -  Net 5062.84 ml   Filed Weights   01/25/19 1316  Weight: 108 kg    Examination:  General exam: Appears calm and comfortable  Respiratory system: Clear to auscultation. Respiratory effort normal. Cardiovascular system: S1 & S2 heard, RRR. No JVD, murmurs, rubs, gallops or clicks. No pedal edema. Gastrointestinal system: Abdomen is nondistended, soft, mild right upper quadrant tenderness. No organomegaly or masses felt. Normal bowel sounds heard. Central nervous system: Alert and oriented. No focal neurological deficits. Extremities: Symmetric 5 x 5 power. Skin: No rashes, lesions or ulcers Psychiatry: Judgement and insight appear normal. Mood & affect  appropriate.     Data Reviewed: I have personally reviewed following labs and imaging studies  CBC: Recent Labs  Lab 01/25/19 1431 01/26/19 0523 01/27/19 0610  WBC 19.4* 18.1* 14.4*  NEUTROABS 16.6*  --   --   HGB 13.4 11.3* 10.6*  HCT 41.5 36.2 34.2*  MCV 92.4 94.5 93.7  PLT 184 162 150   Basic Metabolic Panel: Recent Labs  Lab 01/25/19 1431 01/26/19 0523 01/27/19 0610  NA 134* 133* 137  K 3.8 3.9 4.1  CL 101 102 106  CO2 23 20* 24  GLUCOSE 152* 147* 139*  BUN 13 21 18   CREATININE 0.90 1.01* 0.93  CALCIUM 8.8* 8.0* 8.0*   GFR: Estimated Creatinine Clearance: 65.4 mL/min (by C-G formula based on SCr of 0.93 mg/dL). Liver Function Tests: Recent Labs  Lab 01/26/19 0523 01/27/19 0610  AST 43* 62*  ALT 41 76*  ALKPHOS 82 87  BILITOT 1.5* 1.3*  PROT 6.4* 5.9*  ALBUMIN 3.1* 2.7*   No results for input(s): LIPASE, AMYLASE in the last 168 hours. No results for input(s): AMMONIA in the last 168 hours. Coagulation Profile: Recent Labs  Lab 01/27/19 0610  INR 1.45   Cardiac Enzymes: Recent Labs  Lab 01/25/19 1756 01/25/19 2107 01/26/19 0000 01/26/19 0523  TROPONINI 0.72* 0.49* 0.47* 0.35*   BNP (last 3 results) No results for input(s): PROBNP  in the last 8760 hours. HbA1C: No results for input(s): HGBA1C in the last 72 hours. CBG: No results for input(s): GLUCAP in the last 168 hours. Lipid Profile: No results for input(s): CHOL, HDL, LDLCALC, TRIG, CHOLHDL, LDLDIRECT in the last 72 hours. Thyroid Function Tests: Recent Labs    01/25/19 2107  TSH 0.540   Anemia Panel: No results for input(s): VITAMINB12, FOLATE, FERRITIN, TIBC, IRON, RETICCTPCT in the last 72 hours. Sepsis Labs: Recent Labs  Lab 01/25/19 1431 01/26/19 1129 01/26/19 1359  LATICACIDVEN 1.2 1.1 1.3    Recent Results (from the past 240 hour(s))  Respiratory Panel by PCR     Status: None   Collection Time: 01/25/19  2:31 PM  Result Value Ref Range Status   Adenovirus NOT  DETECTED NOT DETECTED Final   Coronavirus 229E NOT DETECTED NOT DETECTED Final    Comment: (NOTE) The Coronavirus on the Respiratory Panel, DOES NOT test for the novel  Coronavirus (2019 nCoV)    Coronavirus HKU1 NOT DETECTED NOT DETECTED Final   Coronavirus NL63 NOT DETECTED NOT DETECTED Final   Coronavirus OC43 NOT DETECTED NOT DETECTED Final   Metapneumovirus NOT DETECTED NOT DETECTED Final   Rhinovirus / Enterovirus NOT DETECTED NOT DETECTED Final   Influenza A NOT DETECTED NOT DETECTED Final   Influenza B NOT DETECTED NOT DETECTED Final   Parainfluenza Virus 1 NOT DETECTED NOT DETECTED Final   Parainfluenza Virus 2 NOT DETECTED NOT DETECTED Final   Parainfluenza Virus 3 NOT DETECTED NOT DETECTED Final   Parainfluenza Virus 4 NOT DETECTED NOT DETECTED Final   Respiratory Syncytial Virus NOT DETECTED NOT DETECTED Final   Bordetella pertussis NOT DETECTED NOT DETECTED Final   Chlamydophila pneumoniae NOT DETECTED NOT DETECTED Final   Mycoplasma pneumoniae NOT DETECTED NOT DETECTED Final    Comment: Performed at [facility], [address] [place], [place] [address]  Blood Culture (routine x 2)     Status: None (Preliminary result)   Collection Time: 01/25/19  5:50 PM  Result Value Ref Range Status   Specimen Description   Final    BLOOD LEFT FOREARM Performed at [facility], [address] [place], [place] [address]    Special Requests   Final    BOTTLES DRAWN AEROBIC AND ANAEROBIC Blood Culture adequate volume Performed at [facility], [address], [place], [place] [address]    Culture   Final    NO GROWTH 2 DAYS Performed at [facility], [address], [place], [place] [address]    Report Status PENDING  Incomplete  Blood Culture (routine x 2)     Status: None (Preliminary result)   Collection Time: 01/25/19  5:54 PM  Result Value Ref Range Status   Specimen Description   Final    BLOOD RIGHT  ANTECUBITAL Performed at [facility], [address] [place], [place] [address]    Special Requests   Final    BOTTLES DRAWN AEROBIC AND ANAEROBIC Blood Culture adequate volume Performed at [facility], [address] [place], [place] [address]    Culture   Final    NO GROWTH 2 DAYS Performed at [facility], [address] [place], [place] [address]    Report Status PENDING  Incomplete         Radiology Studies: Dg Chest 2 View  Result Date: 01/25/2019 CLINICAL DATA:  Cough and headache EXAM: CHEST - 2 VIEW COMPARISON:  02/23/2018 FINDINGS: The heart size and mediastinal contours are within normal limits. Both lungs are clear. The visualized skeletal structures are unremarkable. IMPRESSION: No active cardiopulmonary disease. Electronically Signed   By: Deatra Robinson M.D.   On: 01/25/2019 15:15   Ct Abdomen Pelvis W Contrast  Result Date: 01/26/2019 CLINICAL DATA:  Abdominal tenderness. Right lateral rib pain with inspiration. Fever. EXAM: CT ABDOMEN AND PELVIS WITH CONTRAST TECHNIQUE: Multidetector CT imaging of the abdomen and pelvis was performed using the standard protocol following bolus administration of intravenous contrast. CONTRAST:  OMNIPAQUE IOHEXOL 300 MG/ML SOLN, 15mL OMNIPAQUE IOHEXOL 300 MG/ML SOLN COMPARISON:  08/16/2015 FINDINGS: Lower chest: Fluid density lesion adjacent to the right inferior pulmonary vein is stable since prior study and comparing back to an exam from 10/14/2014. This measures water attenuation and is compatible with a benign cystic lesion, potentially right pulmonary venous recess or pericardial cyst. Airspace opacity in the posterior right costophrenic sulcus may be related to atelectasis although infection can not be excluded. Hepatobiliary: 4.1 x 4.0 cm ill-defined low-density lesion is identified in the right liver (26/2). 5 mm hypoattenuating subcapsular lesion in the lateral segment left liver  is stable since prior study. Subtle nodularity of liver contour is associated with prominent lateral segment left liver, raising the question of cirrhosis. Gallbladder surgically absent. No intrahepatic or extrahepatic biliary dilation. Pancreas: No focal mass lesion. No dilatation of the main duct. No intraparenchymal cyst. No peripancreatic edema. Spleen: No splenomegaly. No focal mass lesion. Adrenals/Urinary Tract: No adrenal nodule or mass. Kidneys unremarkable. No evidence for hydroureter. The urinary bladder appears normal for the degree of distention. Stomach/Bowel: Tiny hiatal hernia. Stomach otherwise unremarkable. Duodenum is normally positioned as is the ligament of Treitz. No small bowel wall thickening. No small bowel dilatation. The terminal ileum is normal. The appendix is not visualized, but there is no edema or inflammation in the region of the cecum. No gross colonic mass. No colonic wall thickening. Diverticular changes are noted in the left colon without evidence of diverticulitis. Vascular/Lymphatic: There is abdominal aortic atherosclerosis without aneurysm. 10 mm short axis upper normal hepato duodenal ligament lymph node is not substantially changed in the interval. Other upper normal lymph nodes in the hepato duodenal ligament are also stable. Tiny gastrohepatic ligament lymph nodes are unchanged. No retroperitoneal lymphadenopathy 7 mm pericaval lymph node in the upper pelvis is stable. No pelvic sidewall lymphadenopathy. Reproductive: Uterus surgically absent.  There is no adnexal mass. Other: No intraperitoneal free fluid. Musculoskeletal: No worrisome lytic or sclerotic osseous abnormality. IMPRESSION: 1. 4.1 x 4.0 cm ill-defined low-density lesion in the peripheral right liver. Given the history of high fever and right abdominal/rib pain, abscess would be a distinct consideration. No diverticulitis or other etiology identified to account for potential abscess. Primary hepatic neoplasm  and metastatic disease not excluded. Tissue sampling may be warranted. Liver MRI without and with contrast might provide additional useful information period. 2. Subtle nodularity of liver contour associated with asymmetric enlargement lateral segment left liver. Imaging features raise concern for underlying cirrhosis. Stable borderline lymphadenopathy in the hepato duodenal ligament likely reactive. 3. Airspace disease in the posterior right costophrenic sulcus may be atelectatic, but pneumonia not excluded. 4. Left colonic diverticulosis without diverticulitis. 5.  Aortic Atherosclerois (ICD10-170.0) 6. These results will be called to the ordering clinician or representative by the Radiologist Assistant, and communication documented in the PACS or zVision Dashboard. Electronically Signed   By: Kennith Center M.D.   On: 01/26/2019 15:01  Dg Chest Port 1 View  Result Date: 01/26/2019 CLINICAL DATA:  Fever and chest pain EXAM: PORTABLE CHEST 1 VIEW COMPARISON:  01/25/2019 FINDINGS: Normal heart size. Mild aortic tortuosity. Stable appearance of the hila. There is no edema, consolidation, effusion, or pneumothorax. IMPRESSION: No evidence of pneumonia or other acute finding. Electronically Signed   By: Marnee Spring M.D.   On: 01/26/2019 05:54        Scheduled Meds: . enoxaparin (LOVENOX) injection  50 mg Subcutaneous Q24H  . levothyroxine  150 mcg Oral QHS  . metoprolol tartrate  25 mg Oral BID   Continuous Infusions: . ampicillin-sulbactam (UNASYN) IV 3 g (01/27/19 1222)  . diltiazem (CARDIZEM) infusion Stopped (01/27/19 1130)  . lactated ringers 125 mL/hr at 01/27/19 1000  . metronidazole Stopped (01/27/19 0859)     LOS: 2 days    Time spent: 32 minutes    Reece Fehnel J [place], MD [facility] Pager [phone] If 7PM-7AM, please contact night-coverage [email or web address] Password [email or web address] 01/27/2019, 1:15 PM

## 2019-01-27 NOTE — Progress Notes (Signed)
Pt transported to MRI with telemetry in place by this Clinical research associate. Pt transferred to MRI compatible telemetry in MRI dept. This writer monitored pt while in MRI and transported pt back to room 1421 on floor tele box.Pt was off unit with this writer from 1120 to 1215.  Card gtt turned off at 1130 as pt had converted to NSR with HR in 70s. Will monitor closely.

## 2019-01-27 NOTE — Consult Note (Addendum)
Progress Note  Patient Name: Abigail Ryan Date of Encounter: 01/27/2019  Primary Cardiologist: Dietrich PatesPaula Maitland Muhlbauer, MD - New  Subjective   Pt is having significant RUQ pain. She is now in Afib with RVR. She does not feel palpitations.  She is having a constant chest heaviness and then pain with cough. She has trouble taking a deep breath. No orthopnea or edema.   Inpatient Medications    Scheduled Meds: . enoxaparin (LOVENOX) injection  50 mg Subcutaneous Q24H  . levothyroxine  150 mcg Oral QHS   Continuous Infusions: . ampicillin-sulbactam (UNASYN) IV 3 g (01/27/19 0531)  . diltiazem (CARDIZEM) infusion 5 mg/hr (01/27/19 0745)  . lactated ringers 125 mL/hr at 01/27/19 0208  . metronidazole 500 mg (01/27/19 0759)   PRN Meds: acetaminophen **OR** acetaminophen, fentaNYL (SUBLIMAZE) injection, guaiFENesin-dextromethorphan, iohexol, traMADol   Vital Signs    Vitals:   01/27/19 0502 01/27/19 0546 01/27/19 0614 01/27/19 0741  BP: 120/65 101/72 110/71 125/85  Pulse: (!) 117 (!) 128 (!) 117 (!) 120  Resp:  18 18 18   Temp:  98.8 F (37.1 C) 99 F (37.2 C) 99.6 F (37.6 C)  TempSrc:  Oral Oral Oral  SpO2: 98% 97% 98% 99%  Weight:      Height:        Intake/Output Summary (Last 24 hours) at 01/27/2019 0808 Last data filed at 01/26/2019 1832 Gross per 24 hour  Intake 2885.25 ml  Output -  Net 2885.25 ml   Last 3 Weights 01/25/2019 08/24/2015 08/22/2015  Weight (lbs) 238 lb 229 lb 6 oz 227 lb  Weight (kg) 107.956 kg 104.044 kg 102.967 kg      Telemetry    Atrial fibrillation in the 110's-130's - Personally Reviewed  ECG    Atrial fibrillation with rapid ventricular response, 122 bpm, Low voltage QRS - Personally Reviewed  Physical Exam   GEN: No acute distress.   Neck: No JVD Cardiac: irregularly irregular rhythm, no murmurs, rubs, or gallops.  Respiratory: Clear to auscultation bilaterally but shallow breathing GI: Soft, localized tenderness of RUQ below ribcage,  non-distended  MS: No edema; No deformity. Neuro:  Nonfocal  Psych: Normal affect   Labs    Chemistry Recent Labs  Lab 01/25/19 1431 01/26/19 0523 01/27/19 0610  NA 134* 133* 137  K 3.8 3.9 4.1  CL 101 102 106  CO2 23 20* 24  GLUCOSE 152* 147* 139*  BUN 13 21 18   CREATININE 0.90 1.01* 0.93  CALCIUM 8.8* 8.0* 8.0*  PROT  --  6.4* 5.9*  ALBUMIN  --  3.1* 2.7*  AST  --  43* 62*  ALT  --  41 76*  ALKPHOS  --  82 87  BILITOT  --  1.5* 1.3*  GFRNONAA >60 55* >60  GFRAA >60 >60 >60  ANIONGAP 10 11 7      Hematology Recent Labs  Lab 01/25/19 1431 01/26/19 0523 01/27/19 0610  WBC 19.4* 18.1* 14.4*  RBC 4.49 3.83* 3.65*  HGB 13.4 11.3* 10.6*  HCT 41.5 36.2 34.2*  MCV 92.4 94.5 93.7  MCH 29.8 29.5 29.0  MCHC 32.3 31.2 31.0  RDW 13.4 13.7 14.2  PLT 184 162 150    Cardiac Enzymes Recent Labs  Lab 01/25/19 1756 01/25/19 2107 01/26/19 0000 01/26/19 0523  TROPONINI 0.72* 0.49* 0.47* 0.35*    Recent Labs  Lab 01/25/19 1444  TROPIPOC 0.40*     BNPNo results for input(s): BNP, PROBNP in the last 168 hours.   DDimer  No results for input(s): DDIMER in the last 168 hours.   Radiology    Dg Chest 2 View  Result Date: 01/25/2019 CLINICAL DATA:  Cough and headache EXAM: CHEST - 2 VIEW COMPARISON:  02/23/2018 FINDINGS: The heart size and mediastinal contours are within normal limits. Both lungs are clear. The visualized skeletal structures are unremarkable. IMPRESSION: No active cardiopulmonary disease. Electronically Signed   By: Deatra Robinson M.D.   On: 01/25/2019 15:15   Ct Abdomen Pelvis W Contrast  Result Date: 01/26/2019 CLINICAL DATA:  Abdominal tenderness. Right lateral rib pain with inspiration. Fever. EXAM: CT ABDOMEN AND PELVIS WITH CONTRAST TECHNIQUE: Multidetector CT imaging of the abdomen and pelvis was performed using the standard protocol following bolus administration of intravenous contrast. CONTRAST:  OMNIPAQUE IOHEXOL 300 MG/ML SOLN, 15mL  OMNIPAQUE IOHEXOL 300 MG/ML SOLN COMPARISON:  08/16/2015 FINDINGS: Lower chest: Fluid density lesion adjacent to the right inferior pulmonary vein is stable since prior study and comparing back to an exam from 10/14/2014. This measures water attenuation and is compatible with a benign cystic lesion, potentially right pulmonary venous recess or pericardial cyst. Airspace opacity in the posterior right costophrenic sulcus may be related to atelectasis although infection can not be excluded. Hepatobiliary: 4.1 x 4.0 cm ill-defined low-density lesion is identified in the right liver (26/2). 5 mm hypoattenuating subcapsular lesion in the lateral segment left liver is stable since prior study. Subtle nodularity of liver contour is associated with prominent lateral segment left liver, raising the question of cirrhosis. Gallbladder surgically absent. No intrahepatic or extrahepatic biliary dilation. Pancreas: No focal mass lesion. No dilatation of the main duct. No intraparenchymal cyst. No peripancreatic edema. Spleen: No splenomegaly. No focal mass lesion. Adrenals/Urinary Tract: No adrenal nodule or mass. Kidneys unremarkable. No evidence for hydroureter. The urinary bladder appears normal for the degree of distention. Stomach/Bowel: Tiny hiatal hernia. Stomach otherwise unremarkable. Duodenum is normally positioned as is the ligament of Treitz. No small bowel wall thickening. No small bowel dilatation. The terminal ileum is normal. The appendix is not visualized, but there is no edema or inflammation in the region of the cecum. No gross colonic mass. No colonic wall thickening. Diverticular changes are noted in the left colon without evidence of diverticulitis. Vascular/Lymphatic: There is abdominal aortic atherosclerosis without aneurysm. 10 mm short axis upper normal hepato duodenal ligament lymph node is not substantially changed in the interval. Other upper normal lymph nodes in the hepato duodenal ligament are also  stable. Tiny gastrohepatic ligament lymph nodes are unchanged. No retroperitoneal lymphadenopathy 7 mm pericaval lymph node in the upper pelvis is stable. No pelvic sidewall lymphadenopathy. Reproductive: Uterus surgically absent.  There is no adnexal mass. Other: No intraperitoneal free fluid. Musculoskeletal: No worrisome lytic or sclerotic osseous abnormality. IMPRESSION: 1. 4.1 x 4.0 cm ill-defined low-density lesion in the peripheral right liver. Given the history of high fever and right abdominal/rib pain, abscess would be a distinct consideration. No diverticulitis or other etiology identified to account for potential abscess. Primary hepatic neoplasm and metastatic disease not excluded. Tissue sampling may be warranted. Liver MRI without and with contrast might provide additional useful information period. 2. Subtle nodularity of liver contour associated with asymmetric enlargement lateral segment left liver. Imaging features raise concern for underlying cirrhosis. Stable borderline lymphadenopathy in the hepato duodenal ligament likely reactive. 3. Airspace disease in the posterior right costophrenic sulcus may be atelectatic, but pneumonia not excluded. 4. Left colonic diverticulosis without diverticulitis. 5.  Aortic Atherosclerois (ICD10-170.0) 6. These  results will be called to the ordering clinician or representative by the Radiologist Assistant, and communication documented in the PACS or zVision Dashboard. Electronically Signed   By: Kennith Center M.D.   On: 01/26/2019 15:01   Dg Chest Port 1 View  Result Date: 01/26/2019 CLINICAL DATA:  Fever and chest pain EXAM: PORTABLE CHEST 1 VIEW COMPARISON:  01/25/2019 FINDINGS: Normal heart size. Mild aortic tortuosity. Stable appearance of the hila. There is no edema, consolidation, effusion, or pneumothorax. IMPRESSION: No evidence of pneumonia or other acute finding. Electronically Signed   By: Marnee Spring M.D.   On: 01/26/2019 05:54    Cardiac  Studies   Echocardiogram 01/26/2019 IMPRESSIONS   1. The left ventricle has normal systolic function, with an ejection fraction of 55-60%. The cavity size was normal. Mild asymmetric septal hypertrophy. Left ventricular diastolic parameters were normal No evidence of left ventricular regional wall  motion abnormalities.  2. The right ventricle has normal systolic function. The cavity was mildly enlarged. There is no increase in right ventricular wall thickness. Right ventricular systolic pressure could not be assessed.  3. The mitral valve is normal in structure. There is mild mitral annular calcification present.  4. The tricuspid valve is normal in structure.  5. The aortic valve is tricuspid.  6. The inferior vena cava was dilated in size with >50% respiratory variability.  7. No evidence of left ventricular regional wall motion abnormalities.  SUMMARY   Normal LV EF. No significant wall motion abnormalities noted, though technical limitations reduce sensitivity for mild abnormalities. No significant valve disease.  FINDINGS  Left Ventricle: The left ventricle has normal systolic function, with an ejection fraction of 55-60%. The cavity size was normal. Mild asymmetric septal hypertrophy. Left ventricular diastolic parameters were normal No evidence of left ventricular  regional wall motion abnormalities.. Right Ventricle: The right ventricle has normal systolic function. The cavity was mildly enlarged. There is no increase in right ventricular wall thickness. Right ventricular systolic pressure could not be assessed.   Patient Profile     73 y.o. female a history of hypothyroid and nephrolithiasis. No prrio cardiac history. Admitted 01/25/19 with fever up to 105 and cough. She complained of R lateral rib pain only with deep breathing. We were consulted for elevated troponin.   Assessment & Plan    1. Elevated troponin -Troponin was mildly elevated at 0.72 and trended down to 0.35.   -In setting of sepsis with fever up to 105 and tachycardia.  No substernal chest pain or prior known cardiac disease, She does have some chest pain with cough.  -Echocardiogram showed normal LV systolic function with no wall regional motion abnormalities,  Bilateral atria were normal in size. Troponin elevation most likely due to demand ischemia in setting of sepsis   2  Atrial fibrillation with RVR -No prior known hx of afib.  Patient denies feeling the spell   -Pt developed afib with RVR this am, 122 bpm.  -Started on IV diltiazem drip this am after bolus of 5 mg X2.  -Continue cardizem drip for rate control and I will add beta blocker. -CHA2DS2/VAS Stroke Risk Score is 2 for age and sex.  Hold anticoag for now    Sepsis -Pt with fever starting 3 days PTA. She has significant RUQ pain.  -Likely intra-abdominal source per IM notes. CT abd done yest showed an ill defiened lesion in the liver which could be abscess vs neoplasm. She has an MRI ordered for further evaluation. Hypothyroid -  TSH in normal range. Continues on thyroid replacement therapy.   For questions or updates, please contact [facility] Please consult [email or web address] for contact info under        Signed, Berton Bon, NP  01/27/2019, 8:08 AM    Pt seen and examined   I have amended note above by Cleotis Nipper to reflect my findings Pt appears much more comfortable than yesterday    Currently denies pain   No palpitaitons ever She is back in SR Lungs are CTA  Cardiac exam  RRR   No S3   Abd   Decreased BS   Supple   Ext are without edema  Continue current care   Back in SR    Diltiazem gtt has been stopped On oral metoprolol  I would add low dose IV metoprolol   She may not be absorbing PO Follow   Intensify Rx if recurs  Dietrich Pates

## 2019-01-27 NOTE — Plan of Care (Signed)
  Problem: Education: Goal: Knowledge of General Education information will improve Description Including pain rating scale, medication(s)/side effects and non-pharmacologic comfort measures Outcome: Progressing   Problem: Health Behavior/Discharge Planning: Goal: Ability to manage health-related needs will improve Outcome: Progressing   Problem: Clinical Measurements: Goal: Ability to maintain clinical measurements within normal limits will improve Outcome: Progressing Note:  Pt in afib w/ RVR. Card gtt in place. Cardiology consulted.  Goal: Will remain free from infection Outcome: Progressing Note:  IV abx Goal: Diagnostic test results will improve Outcome: Progressing Note:  Awaiting MRI and IR consult.  Goal: Cardiovascular complication will be avoided Outcome: Progressing   Problem: Activity: Goal: Risk for activity intolerance will decrease Outcome: Progressing   Problem: Nutrition: Goal: Adequate nutrition will be maintained Outcome: Progressing Note:  PT NPO   Problem: Coping: Goal: Level of anxiety will decrease Outcome: Progressing   Problem: Elimination: Goal: Will not experience complications related to bowel motility Outcome: Progressing   Problem: Pain Managment: Goal: General experience of comfort will improve Outcome: Progressing   Problem: Safety: Goal: Ability to remain free from injury will improve Outcome: Progressing   Problem: Skin Integrity: Goal: Risk for impaired skin integrity will decrease Outcome: Progressing

## 2019-01-27 NOTE — Progress Notes (Signed)
I have assumed care over this pt at this time. Plan of care explained and all questions answered. Pt comfortable. Will continue to monitor.

## 2019-01-27 NOTE — Progress Notes (Signed)
   01/27/19 1535  Clinical Encounter Type  Visited With Patient and family together  Visit Type Initial;Spiritual support  Referral From Chaplain  Consult/Referral To Chaplain  The chaplain was welcomed into the Pt. room with family.  Upon entering, the Pt. shared her care plan and her faith with the chaplain.  The Pt. accepted the chaplain's invitation for prayer.  The chaplain is available for F/U spiritual care as needed.

## 2019-01-27 NOTE — Significant Event (Signed)
Rapid Response Event Note  Overview: Time Called: 0418 Arrival Time: 0422 Event Type: Cardiac  Initial Focused Assessment: Patient alert and oriented lying in bed. The patient is very diaphoretic, noted by moist & warm skin, and patient's bottom bed sheet was saturated with perspiration. Patient has had a low grade fever all night, 72F. Patient complains of pain in her right lower abdomen. HR currently sustaining greater than 130 bpm. EKG completed and reads Afib RVR. Bedside RN currently giving 5mg  IV Cardizem per NP order. BP 101/72 (82).  Interventions: -EKG completed. -5 mg IV Cardizem given, bedside RN to follow up with NP in regards to patient's heart rate response. -I spoke with NP in regards to rechecking a lactic acid level. Last lactic acid checked at 1359 on 01/26/19 was 1.3 and previous check was 1.1. NP does not recommend lactic acid to be checked at this time.   Plan of Care (if not transferred): RN to follow up with NP in regards to effectiveness of Cardizem IV push in lowering patient's heart rate.   Although patient's heart rate is elevated she does not appear short of breath, but does state some discomfort related to breathing due to pain in right lower abdomen. Patient awake, alert and able to carry on conversations with RNs at bedside.  Second dose of IV Cardizem given and HR did come down to the 90s. HR now sustaining 110-120 bpm. NP requested patient go to MRI at this time for follow-up on CT results. MRI unable to take patient at this time. RN to follow up with NP at this time.   Event Summary:   Abigail Ryan

## 2019-01-28 LAB — COMPREHENSIVE METABOLIC PANEL
ALT: 56 U/L — ABNORMAL HIGH (ref 0–44)
AST: 32 U/L (ref 15–41)
Albumin: 2.8 g/dL — ABNORMAL LOW (ref 3.5–5.0)
Alkaline Phosphatase: 81 U/L (ref 38–126)
Anion gap: 8 (ref 5–15)
BUN: 19 mg/dL (ref 8–23)
CO2: 24 mmol/L (ref 22–32)
Calcium: 8.1 mg/dL — ABNORMAL LOW (ref 8.9–10.3)
Chloride: 105 mmol/L (ref 98–111)
Creatinine, Ser: 0.83 mg/dL (ref 0.44–1.00)
GFR calc Af Amer: >60 mL/min (ref >60)
GFR calc non Af Amer: >60 mL/min (ref >60)
GLUCOSE: 134 mg/dL — AB (ref 70–99)
Potassium: 3.8 mmol/L (ref 3.5–5.1)
Sodium: 137 mmol/L (ref 135–145)
Total Bilirubin: 1 mg/dL (ref 0.3–1.2)
Total Protein: 6.1 g/dL — ABNORMAL LOW (ref 6.5–8.1)

## 2019-01-28 LAB — CBC
HCT: 33.1 % — ABNORMAL LOW (ref 36.0–46.0)
Hemoglobin: 10.1 g/dL — ABNORMAL LOW (ref 12.0–15.0)
MCH: 29.4 pg (ref 26.0–34.0)
MCHC: 30.5 g/dL (ref 30.0–36.0)
MCV: 96.2 fL (ref 80.0–100.0)
NRBC: 0 % (ref 0.0–0.2)
Platelets: 153 10*3/uL (ref 150–400)
RBC: 3.44 MIL/uL — ABNORMAL LOW (ref 3.87–5.11)
RDW: 14.5 % (ref 11.5–15.5)
WBC: 12.8 10*3/uL — ABNORMAL HIGH (ref 4.0–10.5)

## 2019-01-28 MED ORDER — FUROSEMIDE 10 MG/ML IJ SOLN
60.0000 mg | Freq: Once | INTRAMUSCULAR | Status: AC
  Administered 2019-01-28: 60 mg via INTRAVENOUS
  Filled 2019-01-28: qty 6

## 2019-01-28 MED ORDER — IPRATROPIUM-ALBUTEROL 0.5-2.5 (3) MG/3ML IN SOLN
3.0000 mL | Freq: Once | RESPIRATORY_TRACT | Status: DC
  Filled 2019-01-28: qty 3

## 2019-01-28 MED ORDER — GUAIFENESIN-DM 100-10 MG/5ML PO SYRP
5.0000 mL | ORAL_SOLUTION | ORAL | Status: DC | PRN
  Administered 2019-01-28 – 2019-01-31 (×8): 5 mL via ORAL
  Filled 2019-01-28 (×8): qty 10

## 2019-01-28 MED ORDER — METOPROLOL TARTRATE 5 MG/5ML IV SOLN
5.0000 mg | Freq: Four times a day (QID) | INTRAVENOUS | Status: DC | PRN
  Administered 2019-01-28: 5 mg via INTRAVENOUS
  Filled 2019-01-28: qty 5

## 2019-01-28 MED ORDER — METOPROLOL TARTRATE 50 MG PO TABS
50.0000 mg | ORAL_TABLET | Freq: Two times a day (BID) | ORAL | Status: DC
  Administered 2019-01-28 – 2019-01-31 (×7): 50 mg via ORAL
  Filled 2019-01-28 (×7): qty 1

## 2019-01-28 MED ORDER — METOPROLOL TARTRATE 5 MG/5ML IV SOLN
5.0000 mg | Freq: Once | INTRAVENOUS | Status: AC
  Administered 2019-01-28: 5 mg via INTRAVENOUS
  Filled 2019-01-28: qty 5

## 2019-01-28 MED ORDER — POTASSIUM CHLORIDE CRYS ER 20 MEQ PO TBCR
20.0000 meq | EXTENDED_RELEASE_TABLET | Freq: Every day | ORAL | Status: DC
  Administered 2019-01-28 – 2019-01-29 (×2): 20 meq via ORAL
  Filled 2019-01-28 (×2): qty 1

## 2019-01-28 MED ORDER — DILTIAZEM HCL 25 MG/5ML IV SOLN
20.0000 mg | Freq: Once | INTRAVENOUS | Status: AC
  Administered 2019-01-28: 20 mg via INTRAVENOUS
  Filled 2019-01-28: qty 5

## 2019-01-28 NOTE — Progress Notes (Signed)
CMT notified writer of patient going back into Afib. Dr. [place] notified. New orders received and followed.

## 2019-01-28 NOTE — Progress Notes (Signed)
PROGRESS NOTE    Abigail Ryan  VQQ:[id number] DOB: 1946-07-09 DOA: 01/25/2019 PCP: Kaleen Mask, MD   Chief complaint: Fever   Brief Narrative:   Abigail Ryan is a 73 y.o. female with medical history significant of hypothyroid presents to ED with 1 day hx of mild productive cough, chest pains. Denies sick contacts. Reports sitting watching TV around 5pm on day prior to admit when pt suddenly felt flushed and feverish. Temp note to be as high as 105F. Fevers persisted overnight into day of admit. Initially denied cough, however later reported mild cough productive of thick sputum. Denies diarrhea or neck stiffness.  ED Course: In the ED, pt note to be febrile to 104F improved with tylenol. CXR found to be clear. UA and blood cultures were ordered, pending. Flu was found to be neg. Patient was started on empiric azithromycin and rocephin. Pt also noted to have trop of 0.4 with no ischemic changes on EKG. Cardiology consulted by EDP. Hospitalist consulted for consideration for admission. Of note, patient has since reported feeling much better after tylenol and one dose of toradol.   Assessment & Plan:   Principal Problem:   Sepsis (HCC) Active Problems:   Hypothyroid   Elevated troponin   Atrial fibrillation with RVR (HCC)   Liver abscess   Sepsis; POA Hepatic abscess Patient presents with fever up to 105 at home with right upper quadrant pain.  CT abdomen/pelvis notable for a 4.1 x 4.0 low density lesion peripheral right liver consistent with possible abscess.  Also noted nodular texture to the liver consistent with possible cirrhosis.  Total bilirubin elevated 1.5 with AST of 43 and ALT of 41.  Patient was febrile to 104.0 in the ED with a white blood cell count of 18.1.  Lactic acid 1.3. --MRI abdomen w/ right hepatic lobe 5.0 x 4.8 cm lesion consistent with a hepatic abscess --s/p IR drainage/aspiration on 2/19; cultures pending --WBC trending down  18.1-->14.4-->12.8 --Blood cultures x2 01/25/2019:  --Continue antibiotics with Unasyn 3 g IV q6h; await further culture identification and susceptibilities  Paroxysmal atrial fibrillation with RVR Patient was noted to have elevated heart rate on admission with EKG notable for A. fib with RVR.  No previous history.  Likely exacerbated by acute infectious process/pain. --Cardiology following, appreciate assistance --TTE 2/18: EF 55-60%, no evidence LV wall motion abnormalities --CHA2DS2/VASC = 2 (age and gender) --TSH 0.540; wnL --Started on a Cardizem drip; titrated off as of 01/27/2019 --Cardiology starting metoprolol tartrate 25 mg p.o. twice daily for rate control --Holding off anticoagulation as likely precipitating factor acute infectious process as above --Continue to monitor on telemetry  Elevated troponin Etiology likely demand ischemia in the setting of sepsis and A. fib with RVR as above. --Troponin flat/trending down; 0.72-->0.49-->0.47-->0.35 --Continue antibiotics for likely liver abscess and rate control as above --Continue to monitor on telemetry  Hypothyroidism TSH within normal range, 0.540.   --Continue Synthroid 150 mcg p.o. daily.  DVT prophylaxis: Lovenox Code Status: Full code Family Communication: None Disposition Plan: Continue inpatient level of care   Consultants:   Cardiology, Dr. Tenny Craw  Interventional radiology, Dr. Lowella Dandy  Procedures:   TTE 01/26/2019: 1. The left ventricle has normal systolic function, with an ejection fraction of 55-60%. The cavity size was normal. Mild asymmetric septal hypertrophy. Left ventricular diastolic parameters were normal No evidence of left ventricular regional wall  motion abnormalities.  2. The right ventricle has normal systolic function. The cavity was mildly enlarged. There is  no increase in right ventricular wall thickness. Right ventricular systolic pressure could not be assessed.  3. The mitral valve is normal in  structure. There is mild mitral annular calcification present.  4. The tricuspid valve is normal in structure.  5. The aortic valve is tricuspid.  6. The inferior vena cava was dilated in size with >50% respiratory variability.  7. No evidence of left ventricular regional wall motion abnormalities.  IR liver abscess aspiration on 01/27/2019  Antimicrobials:   Metronidazole 2/17 - 2/19  Unasyn 2/17>>   Subjective: Patient seen and examined at bedside, resting comfortably in bed; son and daughter present and updated.  Complains of a "rolling" sensation in her mid abdomen.  Reports bowel movement this morning and feels like she has another one pending.  Right sided upper quadrant tenderness now resolved.  Has slight fever overnight to 100.3, although improved. Continues on IV antibiotics.  Underwent IR aspiration of her liver abscess yesterday with cultures pending.  Complains of some shortness of breath this afternoon, although saturating 97% on room air.  No other complaints at this time.  Denies headache, no visual changes, no nausea/vomiting/diarrhea, no chest pain, no palpitations, no chills/night sweats, no cough/congestion, no issues with bowel/bladder function, no paresthesias.  No acute concerns overnight per nursing staff.  Objective: Vitals:   01/28/19 0327 01/28/19 0539 01/28/19 0852 01/28/19 1256  BP:  (!) 157/78 (!) 142/73 (!) 144/74  Pulse:  65 73 64  Resp:  18 (!) 22 (!) 26  Temp: 99.6 F (37.6 C) 98.8 F (37.1 C) 100.3 F (37.9 C) (!) 100.5 F (38.1 C)  TempSrc: Oral Oral Oral Oral  SpO2:  96% 96% 97%  Weight:      Height:        Intake/Output Summary (Last 24 hours) at 01/28/2019 1326 Last data filed at 01/28/2019 [id number] Gross per 24 hour  Intake 1415.01 ml  Output -  Net 1415.01 ml   Filed Weights   01/25/19 1316  Weight: 108 kg    Examination:  General exam: Appears calm and comfortable  Respiratory system: Clear to auscultation. Respiratory effort  normal. Cardiovascular system: S1 & S2 heard, RRR. No JVD, murmurs, rubs, gallops or clicks. No pedal edema. Gastrointestinal system: Abdomen is nondistended, soft, mild right upper quadrant tenderness. No organomegaly or masses felt. Normal bowel sounds heard. Central nervous system: Alert and oriented. No focal neurological deficits. Extremities: Symmetric 5 x 5 power. Skin: No rashes, lesions or ulcers Psychiatry: Judgement and insight appear normal. Mood & affect appropriate.     Data Reviewed: I have personally reviewed following labs and imaging studies  CBC: Recent Labs  Lab 01/25/19 1431 01/26/19 0523 01/27/19 0610 01/28/19 0602  WBC 19.4* 18.1* 14.4* 12.8*  NEUTROABS 16.6*  --   --   --   HGB 13.4 11.3* 10.6* 10.1*  HCT 41.5 36.2 34.2* 33.1*  MCV 92.4 94.5 93.7 96.2  PLT 184 162 150 153   Basic Metabolic Panel: Recent Labs  Lab 01/25/19 1431 01/26/19 0523 01/27/19 0610 01/28/19 0602  NA 134* 133* 137 137  K 3.8 3.9 4.1 3.8  CL 101 102 106 105  CO2 23 20* 24 24  GLUCOSE 152* 147* 139* 134*  BUN 13 21 18 19   CREATININE 0.90 1.01* 0.93 0.83  CALCIUM 8.8* 8.0* 8.0* 8.1*   GFR: Estimated Creatinine Clearance: 73.3 mL/min (by C-G formula based on SCr of 0.83 mg/dL). Liver Function Tests: Recent Labs  Lab 01/26/19 0523 01/27/19 0610 01/28/19  0602  AST 43* 62* 32  ALT 41 76* 56*  ALKPHOS 82 87 81  BILITOT 1.5* 1.3* 1.0  PROT 6.4* 5.9* 6.1*  ALBUMIN 3.1* 2.7* 2.8*   No results for input(s): LIPASE, AMYLASE in the last 168 hours. No results for input(s): AMMONIA in the last 168 hours. Coagulation Profile: Recent Labs  Lab 01/27/19 0610  INR 1.45   Cardiac Enzymes: Recent Labs  Lab 01/25/19 1756 01/25/19 2107 01/26/19 0000 01/26/19 0523  TROPONINI 0.72* 0.49* 0.47* 0.35*   BNP (last 3 results) No results for input(s): PROBNP in the last 8760 hours. HbA1C: No results for input(s): HGBA1C in the last 72 hours. CBG: No results for input(s):  GLUCAP in the last 168 hours. Lipid Profile: No results for input(s): CHOL, HDL, LDLCALC, TRIG, CHOLHDL, LDLDIRECT in the last 72 hours. Thyroid Function Tests: Recent Labs    01/25/19 2107  TSH 0.540   Anemia Panel: No results for input(s): VITAMINB12, FOLATE, FERRITIN, TIBC, IRON, RETICCTPCT in the last 72 hours. Sepsis Labs: Recent Labs  Lab 01/25/19 1431 01/26/19 1129 01/26/19 1359  LATICACIDVEN 1.2 1.1 1.3    Recent Results (from the past 240 hour(s))  Respiratory Panel by PCR     Status: None   Collection Time: 01/25/19  2:31 PM  Result Value Ref Range Status   Adenovirus NOT DETECTED NOT DETECTED Final   Coronavirus 229E NOT DETECTED NOT DETECTED Final    Comment: (NOTE) The Coronavirus on the Respiratory Panel, DOES NOT test for the novel  Coronavirus (2019 nCoV)    Coronavirus HKU1 NOT DETECTED NOT DETECTED Final   Coronavirus NL63 NOT DETECTED NOT DETECTED Final   Coronavirus OC43 NOT DETECTED NOT DETECTED Final   Metapneumovirus NOT DETECTED NOT DETECTED Final   Rhinovirus / Enterovirus NOT DETECTED NOT DETECTED Final   Influenza A NOT DETECTED NOT DETECTED Final   Influenza B NOT DETECTED NOT DETECTED Final   Parainfluenza Virus 1 NOT DETECTED NOT DETECTED Final   Parainfluenza Virus 2 NOT DETECTED NOT DETECTED Final   Parainfluenza Virus 3 NOT DETECTED NOT DETECTED Final   Parainfluenza Virus 4 NOT DETECTED NOT DETECTED Final   Respiratory Syncytial Virus NOT DETECTED NOT DETECTED Final   Bordetella pertussis NOT DETECTED NOT DETECTED Final   Chlamydophila pneumoniae NOT DETECTED NOT DETECTED Final   Mycoplasma pneumoniae NOT DETECTED NOT DETECTED Final    Comment: Performed at [facility], [address] [place], [place] [address]  Blood Culture (routine x 2)     Status: None (Preliminary result)   Collection Time: 01/25/19  5:50 PM  Result Value Ref Range Status   Specimen Description   Final    BLOOD LEFT FOREARM Performed at [facility], [address], [place], [place] [address]    Special Requests   Final    BOTTLES DRAWN AEROBIC AND ANAEROBIC Blood Culture adequate volume Performed at [facility], [address] [place], [place] [address]    Culture   Final    NO GROWTH 3 DAYS Performed at [facility], [address] [place], [place] [address]    Report Status PENDING  Incomplete  Blood Culture (routine x 2)     Status: None (Preliminary result)   Collection Time: 01/25/19  5:54 PM  Result Value Ref Range Status   Specimen Description   Final    BLOOD RIGHT ANTECUBITAL Performed at [facility], [address] [place], [place] [address]  Special Requests   Final    BOTTLES DRAWN AEROBIC AND ANAEROBIC Blood Culture adequate volume Performed at [facility], [address], [place], [place] [address]    Culture   Final    NO GROWTH 3 DAYS Performed at [facility], [address], [place], [place] [address]    Report Status PENDING  Incomplete  Aerobic/Anaerobic Culture (surgical/deep wound)     Status: None (Preliminary result)   Collection Time: 01/27/19  5:32 PM  Result Value Ref Range Status   Specimen Description   Final    ABSCESS Performed at [facility], [address] [place], [place] [address]    Special Requests   Final    NONE Performed at [facility], [address], [place], [place] [address]    Gram Stain   Final    ABUNDANT WBC PRESENT,BOTH PMN AND MONONUCLEAR NO ORGANISMS SEEN    Culture   Final    NO GROWTH < 24 HOURS Performed at [facility], [address], [place], [place] [address]    Report Status PENDING  Incomplete         Radiology Studies: US Guided Needle Placement  Result Date: 01/27/2019 INDICATION: 73 year old with right upper quadrant pain, fever and elevated white blood cell count. Indeterminate hepatic lesion and  concern for intrahepatic abscess. Plan for ultrasound-guided liver lesion aspiration. EXAM: ULTRASOUND-GUIDED LIVER LESION ASPIRATION MEDICATIONS: Moderate sedation medications ANESTHESIA/SEDATION: Fentanyl 50 mcg IV; Versed 1.0 mg IV Moderate Sedation Time:  14 minutes The patient was continuously monitored during the procedure by the interventional radiology nurse under my direct supervision. COMPLICATIONS: None immediate. PROCEDURE: Informed written consent was obtained from the patient after a thorough discussion of the procedural risks, benefits and alternatives. All questions were addressed. A timeout was performed prior to the initiation of the procedure. Liver was evaluated with ultrasound. Heterogeneous and slightly hyperechoic area in the anterior right hepatic lobe was identified. This area corresponds with the recent abnormalities on CT and MRI. Right upper abdomen was prepped with chlorhexidine and sterile field was created. Skin and soft tissues were anesthetized with 1% lidocaine. 18 gauge trocar needle was directed into this heterogeneous liver lesion. Approximately 4 mL of thick bloody fluid was aspirated. No additional fluid could be removed. Needle removed without complication. Bandage placed over the puncture site. FINDINGS: Heterogeneous and slightly hyperechoic lesion in the right hepatic lobe corresponding with the known abnormality. Approximately 4 mL of thick bloody fluid was removed. No additional fluid could be aspirated. IMPRESSION: Ultrasound-guided aspiration of the indeterminate right hepatic lesion. 4 mL bloody fluid was removed. Fluid was sent for cytology and culture. Electronically Signed   By: Richarda Overlie M.D.   On: 01/27/2019 17:45   Mr Liver W Wo Contrast  Result Date: 01/27/2019 CLINICAL DATA:  Evaluate possible liver abscess. Fever. Abdominal tenderness EXAM: MRI ABDOMEN WITHOUT AND WITH CONTRAST TECHNIQUE: Multiplanar multisequence MR imaging of the abdomen was performed  both before and after the administration of intravenous contrast. CONTRAST:  10 mL Gadavist COMPARISON:  CT 01/26/2019, 08/16/2015 FINDINGS: Lower chest:  Lung bases are clear. Hepatobiliary: Within the RIGHT hepatic lobe, there is a rounded lesion which is mixed high signal intensity on T2 weighted imaging measuring 5.0 x 4.8 cm (image 31/3). There is a rim of more diffuse high signal intensity around the lesion suggesting edema within the liver parenchyma. On precontrast T1 weighted imaging lesion is hypointense. On postcontrast imaging  there is thin peripheral enhancing rim and enhancing internal septations. The majority of the internal aspect of the lesion is nonenhancing (image series 1002 and 1003). The peripheral rim is mildly enhancing. On diffusion-weighted imaging, lesion is hyperintense and mildly hypointense on the ADC map. No biliary duct dilatation. Postcholecystectomy. Normal common bile duct. Pancreas: Normal pancreatic parenchymal intensity. No ductal dilatation or inflammation. Spleen: Normal spleen. Adrenals/urinary tract: Adrenal glands and kidneys are normal. Stomach/Bowel: Stomach and limited of the small bowel is unremarkable Vascular/Lymphatic: Abdominal aortic normal caliber. No retroperitoneal periportal lymphadenopathy. Musculoskeletal: No aggressive osseous lesion IMPRESSION: Round septated lesion in the RIGHT hepatic lobe with peripheral rim of enhancing edema is most consistent with a HEPATIC ABSCESS. Less favored differential would include a cystic neoplasm or necrotic lesion such is biliary cystadenoma, mucinous lesion, or necrotic metastasis. Electronically Signed   By: Genevive Bi M.D.   On: 01/27/2019 13:58        Scheduled Meds: . enoxaparin (LOVENOX) injection  50 mg Subcutaneous Q24H  . ipratropium-albuterol  3 mL Nebulization Once  . levothyroxine  150 mcg Oral QHS  . metoprolol tartrate  5 mg Intravenous Q6H  . metoprolol tartrate  25 mg Oral BID    Continuous Infusions: . ampicillin-sulbactam (UNASYN) IV 3 g (01/28/19 1156)  . diltiazem (CARDIZEM) infusion Stopped (01/27/19 1123)  . lactated ringers 125 mL/hr at 01/28/19 0812     LOS: 3 days    Time spent: 31 minutes    Alvira Philips [place], DO [facility] Pager [phone]  If 7PM-7AM, please contact night-coverage [email or web address] Password [email or web address] 01/28/2019, 1:26 PM

## 2019-01-28 NOTE — Progress Notes (Addendum)
Progress Note  Patient Name: Abigail Ryan Date of Encounter: 01/28/2019  Primary Cardiologist: Dietrich Pates, MD   Subjective   Patient is feeling better with less abdominal pain.  She is still having fevers.  She is still short of breath with minimal exertion.  No chest pain.  Her main complaint today is that her stomach is "rolling".  Inpatient Medications    Scheduled Meds: . enoxaparin (LOVENOX) injection  50 mg Subcutaneous Q24H  . levothyroxine  150 mcg Oral QHS  . metoprolol tartrate  5 mg Intravenous Q6H  . metoprolol tartrate  25 mg Oral BID   Continuous Infusions: . ampicillin-sulbactam (UNASYN) IV 3 g (01/28/19 0629)  . diltiazem (CARDIZEM) infusion Stopped (01/27/19 1123)  . lactated ringers 125 mL/hr at 01/28/19 0812   PRN Meds: acetaminophen **OR** acetaminophen, benzonatate, fentaNYL (SUBLIMAZE) injection, guaiFENesin-dextromethorphan, iohexol, traMADol   Vital Signs    Vitals:   01/28/19 0040 01/28/19 0327 01/28/19 0539 01/28/19 0852  BP:   (!) 157/78 (!) 142/73  Pulse:   65 73  Resp:   18 (!) 22  Temp:  99.6 F (37.6 C) 98.8 F (37.1 C) 100.3 F (37.9 C)  TempSrc:  Oral Oral Oral  SpO2: 98%  96% 96%  Weight:      Height:        Intake/Output Summary (Last 24 hours) at 01/28/2019 0914 Last data filed at 01/28/2019 5284 Gross per 24 hour  Intake 3592.6 ml  Output -  Net 3592.6 ml   Last 3 Weights 01/25/2019 08/24/2015 08/22/2015  Weight (lbs) 238 lb 229 lb 6 oz 227 lb  Weight (kg) 107.956 kg 104.044 kg 102.967 kg      Telemetry    Sinus rhythm with PACs in the 70s- Personally Reviewed  ECG    No new tracings- Personally Reviewed  Physical Exam   GEN:  Obese female, no acute distress.   Neck: No JVD Cardiac: RRR, no murmurs, rubs, or gallops.  Respiratory: Clear to auscultation bilaterally. GI: Soft, nontender, non-distended  MS: No edema; No deformity. Neuro:  Nonfocal  Psych: Normal affect   Labs    Chemistry Recent Labs  Lab  01/26/19 0523 01/27/19 0610 01/28/19 0602  NA 133* 137 137  K 3.9 4.1 3.8  CL 102 106 105  CO2 20* 24 24  GLUCOSE 147* 139* 134*  BUN 21 18 19   CREATININE 1.01* 0.93 0.83  CALCIUM 8.0* 8.0* 8.1*  PROT 6.4* 5.9* 6.1*  ALBUMIN 3.1* 2.7* 2.8*  AST 43* 62* 32  ALT 41 76* 56*  ALKPHOS 82 87 81  BILITOT 1.5* 1.3* 1.0  GFRNONAA 55* >60 >60  GFRAA >60 >60 >60  ANIONGAP 11 7 8      Hematology Recent Labs  Lab 01/26/19 0523 01/27/19 0610 01/28/19 0602  WBC 18.1* 14.4* 12.8*  RBC 3.83* 3.65* 3.44*  HGB 11.3* 10.6* 10.1*  HCT 36.2 34.2* 33.1*  MCV 94.5 93.7 96.2  MCH 29.5 29.0 29.4  MCHC 31.2 31.0 30.5  RDW 13.7 14.2 14.5  PLT 162 150 153    Cardiac Enzymes Recent Labs  Lab 01/25/19 1756 01/25/19 2107 01/26/19 0000 01/26/19 0523  TROPONINI 0.72* 0.49* 0.47* 0.35*    Recent Labs  Lab 01/25/19 1444  TROPIPOC 0.40*     BNPNo results for input(s): BNP, PROBNP in the last 168 hours.   DDimer No results for input(s): DDIMER in the last 168 hours.   Radiology    US Guided Needle Placement  Result  Date: 01/27/2019 INDICATION: 73 year old with right upper quadrant pain, fever and elevated white blood cell count. Indeterminate hepatic lesion and concern for intrahepatic abscess. Plan for ultrasound-guided liver lesion aspiration. EXAM: ULTRASOUND-GUIDED LIVER LESION ASPIRATION MEDICATIONS: Moderate sedation medications ANESTHESIA/SEDATION: Fentanyl 50 mcg IV; Versed 1.0 mg IV Moderate Sedation Time:  14 minutes The patient was continuously monitored during the procedure by the interventional radiology nurse under my direct supervision. COMPLICATIONS: None immediate. PROCEDURE: Informed written consent was obtained from the patient after a thorough discussion of the procedural risks, benefits and alternatives. All questions were addressed. A timeout was performed prior to the initiation of the procedure. Liver was evaluated with ultrasound. Heterogeneous and slightly  hyperechoic area in the anterior right hepatic lobe was identified. This area corresponds with the recent abnormalities on CT and MRI. Right upper abdomen was prepped with chlorhexidine and sterile field was created. Skin and soft tissues were anesthetized with 1% lidocaine. 18 gauge trocar needle was directed into this heterogeneous liver lesion. Approximately 4 mL of thick bloody fluid was aspirated. No additional fluid could be removed. Needle removed without complication. Bandage placed over the puncture site. FINDINGS: Heterogeneous and slightly hyperechoic lesion in the right hepatic lobe corresponding with the known abnormality. Approximately 4 mL of thick bloody fluid was removed. No additional fluid could be aspirated. IMPRESSION: Ultrasound-guided aspiration of the indeterminate right hepatic lesion. 4 mL bloody fluid was removed. Fluid was sent for cytology and culture. Electronically Signed   By: Richarda Overlie M.D.   On: 01/27/2019 17:45   Ct Abdomen Pelvis W Contrast  Result Date: 01/26/2019 CLINICAL DATA:  Abdominal tenderness. Right lateral rib pain with inspiration. Fever. EXAM: CT ABDOMEN AND PELVIS WITH CONTRAST TECHNIQUE: Multidetector CT imaging of the abdomen and pelvis was performed using the standard protocol following bolus administration of intravenous contrast. CONTRAST:  OMNIPAQUE IOHEXOL 300 MG/ML SOLN, 70mL OMNIPAQUE IOHEXOL 300 MG/ML SOLN COMPARISON:  08/16/2015 FINDINGS: Lower chest: Fluid density lesion adjacent to the right inferior pulmonary vein is stable since prior study and comparing back to an exam from 10/14/2014. This measures water attenuation and is compatible with a benign cystic lesion, potentially right pulmonary venous recess or pericardial cyst. Airspace opacity in the posterior right costophrenic sulcus may be related to atelectasis although infection can not be excluded. Hepatobiliary: 4.1 x 4.0 cm ill-defined low-density lesion is identified in the right  liver (26/2). 5 mm hypoattenuating subcapsular lesion in the lateral segment left liver is stable since prior study. Subtle nodularity of liver contour is associated with prominent lateral segment left liver, raising the question of cirrhosis. Gallbladder surgically absent. No intrahepatic or extrahepatic biliary dilation. Pancreas: No focal mass lesion. No dilatation of the main duct. No intraparenchymal cyst. No peripancreatic edema. Spleen: No splenomegaly. No focal mass lesion. Adrenals/Urinary Tract: No adrenal nodule or mass. Kidneys unremarkable. No evidence for hydroureter. The urinary bladder appears normal for the degree of distention. Stomach/Bowel: Tiny hiatal hernia. Stomach otherwise unremarkable. Duodenum is normally positioned as is the ligament of Treitz. No small bowel wall thickening. No small bowel dilatation. The terminal ileum is normal. The appendix is not visualized, but there is no edema or inflammation in the region of the cecum. No gross colonic mass. No colonic wall thickening. Diverticular changes are noted in the left colon without evidence of diverticulitis. Vascular/Lymphatic: There is abdominal aortic atherosclerosis without aneurysm. 10 mm short axis upper normal hepato duodenal ligament lymph node is not substantially changed in the interval. Other upper normal  lymph nodes in the hepato duodenal ligament are also stable. Tiny gastrohepatic ligament lymph nodes are unchanged. No retroperitoneal lymphadenopathy 7 mm pericaval lymph node in the upper pelvis is stable. No pelvic sidewall lymphadenopathy. Reproductive: Uterus surgically absent.  There is no adnexal mass. Other: No intraperitoneal free fluid. Musculoskeletal: No worrisome lytic or sclerotic osseous abnormality. IMPRESSION: 1. 4.1 x 4.0 cm ill-defined low-density lesion in the peripheral right liver. Given the history of high fever and right abdominal/rib pain, abscess would be a distinct consideration. No diverticulitis  or other etiology identified to account for potential abscess. Primary hepatic neoplasm and metastatic disease not excluded. Tissue sampling may be warranted. Liver MRI without and with contrast might provide additional useful information period. 2. Subtle nodularity of liver contour associated with asymmetric enlargement lateral segment left liver. Imaging features raise concern for underlying cirrhosis. Stable borderline lymphadenopathy in the hepato duodenal ligament likely reactive. 3. Airspace disease in the posterior right costophrenic sulcus may be atelectatic, but pneumonia not excluded. 4. Left colonic diverticulosis without diverticulitis. 5.  Aortic Atherosclerois (ICD10-170.0) 6. These results will be called to the ordering clinician or representative by the Radiologist Assistant, and communication documented in the PACS or zVision Dashboard. Electronically Signed   By: Kennith CenterEric  Mansell M.D.   On: 01/26/2019 15:01   Mr Liver W Wo Contrast  Result Date: 01/27/2019 CLINICAL DATA:  Evaluate possible liver abscess. Fever. Abdominal tenderness EXAM: MRI ABDOMEN WITHOUT AND WITH CONTRAST TECHNIQUE: Multiplanar multisequence MR imaging of the abdomen was performed both before and after the administration of intravenous contrast. CONTRAST:  10 mL Gadavist COMPARISON:  CT 01/26/2019, 08/16/2015 FINDINGS: Lower chest:  Lung bases are clear. Hepatobiliary: Within the RIGHT hepatic lobe, there is a rounded lesion which is mixed high signal intensity on T2 weighted imaging measuring 5.0 x 4.8 cm (image 31/3). There is a rim of more diffuse high signal intensity around the lesion suggesting edema within the liver parenchyma. On precontrast T1 weighted imaging lesion is hypointense. On postcontrast imaging there is thin peripheral enhancing rim and enhancing internal septations. The majority of the internal aspect of the lesion is nonenhancing (image series 1002 and 1003). The peripheral rim is mildly enhancing. On  diffusion-weighted imaging, lesion is hyperintense and mildly hypointense on the ADC map. No biliary duct dilatation. Postcholecystectomy. Normal common bile duct. Pancreas: Normal pancreatic parenchymal intensity. No ductal dilatation or inflammation. Spleen: Normal spleen. Adrenals/urinary tract: Adrenal glands and kidneys are normal. Stomach/Bowel: Stomach and limited of the small bowel is unremarkable Vascular/Lymphatic: Abdominal aortic normal caliber. No retroperitoneal periportal lymphadenopathy. Musculoskeletal: No aggressive osseous lesion IMPRESSION: Round septated lesion in the RIGHT hepatic lobe with peripheral rim of enhancing edema is most consistent with a HEPATIC ABSCESS. Less favored differential would include a cystic neoplasm or necrotic lesion such is biliary cystadenoma, mucinous lesion, or necrotic metastasis. Electronically Signed   By: Genevive BiStewart  Edmunds M.D.   On: 01/27/2019 13:58    Cardiac Studies   Echocardiogram 01/26/2019 IMPRESSIONS  1. The left ventricle has normal systolic function, with an ejection fraction of 55-60%. The cavity size was normal. Mild asymmetric septal hypertrophy. Left ventricular diastolic parameters were normal No evidence of left ventricular regional wall  motion abnormalities. 2. The right ventricle has normal systolic function. The cavity was mildly enlarged. There is no increase in right ventricular wall thickness. Right ventricular systolic pressure could not be assessed. 3. The mitral valve is normal in structure. There is mild mitral annular calcification present. 4. The tricuspid valve  is normal in structure. 5. The aortic valve is tricuspid. 6. The inferior vena cava was dilated in size with >50% respiratory variability. 7. No evidence of left ventricular regional wall motion abnormalities.  SUMMARY  Normal LV EF. No significant wall motion abnormalities noted, though technical limitations reduce sensitivity for mild  abnormalities. No significant valve disease. FINDINGS Left Ventricle: The left ventricle has normal systolic function, with an ejection fraction of 55-60%. The cavity size was normal. Mild asymmetric septal hypertrophy. Left ventricular diastolic parameters were normal No evidence of left ventricular  regional wall motion abnormalities.. Right Ventricle: The right ventricle has normal systolic function. The cavity was mildly enlarged. There is no increase in right ventricular wall thickness. Right ventricular systolic pressure could not be assessed.  Patient Profile     73 y.o. female with a history of hypothyroid and nephrolithiasis. No prior cardiac history. Admitted 01/25/19 with fever up to 105 and cough. She complained of R lateral rib pain only with deep breathing. We were consulted for elevated troponin and atrial fibrillation.  Assessment & Plan    Elevated troponin -Troponin was mildly elevated at 0.72 and trended down to 0.35.  -In setting of sepsis with fever up to 105 and tachycardia.  No substernal chest pain or prior known cardiac disease, She does have some chest pain with cough.  -Echocardiogram showed normal LV systolic function with no wall regional motion abnormalities,  Bilateral atria were normal in size. Troponin elevation most likely due to demand ischemia in setting of sepsis.  Atrial fibrillation with RVR -No prior known history of A. Fib. -In setting of acute illness and fever. -Converted to sinus rhythm on IV diltiazem and beta-blocker. -CHA2DS2/VAS Stroke Risk Score is 2 for age and sex.  Hold anticoag for now.  If this was an isolated episode due to acute illness and A. fib does not recur, would favor not starting anticoagulation. -We will continue to follow.  Sepsis -Finding of hepatic mass on CT and MRI was most consistent with hepatic abscess.  Leukocytosis and fever.  Underwent ultrasound-guided liver lesion aspiration yesterday.  Labs  pending. -Management per primary team.  On IV antibiotics. -Right upper quadrant pain has improved, WBC is coming down, still has fever 100.3 this morning.     For questions or updates, please contact [facility] Please consult [email or web address] for contact info under        Signed, Berton Bon, NP  01/28/2019, 9:14 AM    Pt seen and exmained    Complains of some fullness in chest    Also active bowel sounds   Still some R flank pain Lungs are clear  Neck  Full JVP is increased  Cardiac RRR  N oS3  No signif murmurs    Ext with Tr edema   Tele with no furhter atrial fibrillation   Nt sure of accuracy but pt is over 7 L positive Would give 1 dose of lasix and follow   Dietrich Pates

## 2019-01-28 NOTE — Care Management Important Message (Signed)
Important Message  Patient Details  Name: Abigail Ryan MRN: [id number] Date of Birth: 05/19/1946   Medicare Important Message Given:  Yes    Caren Macadam 01/28/2019, 1:06 PMImportant Message  Patient Details  Name: Abigail Ryan MRN: [id number] Date of Birth: 12-13-1945   Medicare Important Message Given:  Yes    Caren Macadam 01/28/2019, 1:06 PM

## 2019-01-29 DIAGNOSIS — A419 Sepsis, unspecified organism: Principal | ICD-10-CM

## 2019-01-29 LAB — BASIC METABOLIC PANEL
Anion gap: 12 (ref 5–15)
BUN: 22 mg/dL (ref 8–23)
CHLORIDE: 102 mmol/L (ref 98–111)
CO2: 24 mmol/L (ref 22–32)
Calcium: 8 mg/dL — ABNORMAL LOW (ref 8.9–10.3)
Creatinine, Ser: 0.92 mg/dL (ref 0.44–1.00)
GFR calc Af Amer: >60 mL/min (ref >60)
GFR calc non Af Amer: >60 mL/min (ref >60)
Glucose, Bld: 134 mg/dL — ABNORMAL HIGH (ref 70–99)
Potassium: 3.4 mmol/L — ABNORMAL LOW (ref 3.5–5.1)
Sodium: 138 mmol/L (ref 135–145)

## 2019-01-29 LAB — CBC
HCT: 38.5 % (ref 36.0–46.0)
Hemoglobin: 11.5 g/dL — ABNORMAL LOW (ref 12.0–15.0)
MCH: 29.1 pg (ref 26.0–34.0)
MCHC: 29.9 g/dL — ABNORMAL LOW (ref 30.0–36.0)
MCV: 97.5 fL (ref 80.0–100.0)
Platelets: 196 10*3/uL (ref 150–400)
RBC: 3.95 MIL/uL (ref 3.87–5.11)
RDW: 14.3 % (ref 11.5–15.5)
WBC: 11.4 10*3/uL — ABNORMAL HIGH (ref 4.0–10.5)
nRBC: 0 % (ref 0.0–0.2)

## 2019-01-29 MED ORDER — RIVAROXABAN 20 MG PO TABS: 20.0000 mg | ORAL_TABLET | Freq: Every day | ORAL | Status: DC

## 2019-01-29 MED ORDER — RIVAROXABAN 20 MG PO TABS
20.0000 mg | ORAL_TABLET | Freq: Every day | ORAL | Status: DC
  Administered 2019-01-29 – 2019-01-31 (×3): 20 mg via ORAL
  Filled 2019-01-29 (×3): qty 1

## 2019-01-29 MED ORDER — POTASSIUM CHLORIDE CRYS ER 20 MEQ PO TBCR
20.0000 meq | EXTENDED_RELEASE_TABLET | Freq: Every day | ORAL | Status: DC
  Administered 2019-01-29 – 2019-01-31 (×3): 20 meq via ORAL
  Filled 2019-01-29 (×3): qty 1

## 2019-01-29 MED ORDER — POTASSIUM CHLORIDE CRYS ER 10 MEQ PO TBCR
30.0000 meq | EXTENDED_RELEASE_TABLET | Freq: Once | ORAL | Status: AC
  Administered 2019-01-29: 30 meq via ORAL
  Filled 2019-01-29: qty 3

## 2019-01-29 MED ORDER — DILTIAZEM HCL 60 MG PO TABS
60.0000 mg | ORAL_TABLET | Freq: Three times a day (TID) | ORAL | Status: DC
  Administered 2019-01-29 – 2019-02-01 (×9): 60 mg via ORAL
  Filled 2019-01-29 (×9): qty 1

## 2019-01-29 MED ORDER — FUROSEMIDE 10 MG/ML IJ SOLN
80.0000 mg | Freq: Once | INTRAMUSCULAR | Status: AC
  Administered 2019-01-29: 80 mg via INTRAVENOUS
  Filled 2019-01-29: qty 8

## 2019-01-29 MED ORDER — ALBUTEROL SULFATE (2.5 MG/3ML) 0.083% IN NEBU
2.5000 mg | INHALATION_SOLUTION | Freq: Two times a day (BID) | RESPIRATORY_TRACT | Status: DC
  Administered 2019-01-30: 2.5 mg via RESPIRATORY_TRACT
  Filled 2019-01-29 (×2): qty 3

## 2019-01-29 MED ORDER — IPRATROPIUM-ALBUTEROL 0.5-2.5 (3) MG/3ML IN SOLN
3.0000 mL | Freq: Four times a day (QID) | RESPIRATORY_TRACT | Status: DC | PRN
  Administered 2019-01-29 – 2019-01-30 (×3): 3 mL via RESPIRATORY_TRACT
  Filled 2019-01-29 (×2): qty 3

## 2019-01-29 NOTE — Progress Notes (Signed)
PROGRESS NOTE    Abigail Ryan  UKG:[id number] DOB: 05/16/1946 DOA: 01/25/2019 PCP: Kaleen Mask, MD   Chief complaint: Fever   Brief Narrative:   Abigail Ryan is a 73 y.o. female with medical history significant of hypothyroid presents to ED with 1 day hx of mild productive cough, chest pains. Denies sick contacts. Reports sitting watching TV around 5pm on day prior to admit when pt suddenly felt flushed and feverish. Temp note to be as high as 105F. Fevers persisted overnight into day of admit. Initially denied cough, however later reported mild cough productive of thick sputum. Denies diarrhea or neck stiffness.  ED Course: In the ED, pt note to be febrile to 104F improved with tylenol. CXR found to be clear. UA and blood cultures were ordered, pending. Flu was found to be neg. Patient was started on empiric azithromycin and rocephin. Pt also noted to have trop of 0.4 with no ischemic changes on EKG. Cardiology consulted by EDP. Hospitalist consulted for consideration for admission. Of note, patient has since reported feeling much better after tylenol and one dose of toradol.   Assessment & Plan:   Principal Problem:   Sepsis (HCC) Active Problems:   Hypothyroid   Elevated troponin   Atrial fibrillation with RVR (HCC)   Liver abscess   Sepsis; POA Hepatic abscess Patient presents with fever up to 105 at home with right upper quadrant pain.  CT abdomen/pelvis notable for a 4.1 x 4.0 low density lesion peripheral right liver consistent with possible abscess.  Also noted nodular texture to the liver consistent with possible cirrhosis.  Total bilirubin elevated 1.5 with AST of 43 and ALT of 41.  Patient was febrile to 104.0 in the ED with a white blood cell count of 18.1.  Lactic acid 1.3. --MRI abdomen w/ right hepatic lobe 5.0 x 4.8 cm lesion consistent with a hepatic abscess --s/p IR drainage/aspiration on 2/19; cultures without growth x24 hours --WBC trending down  18.1-->14.4-->12.8-->11.4 --Blood cultures x2 01/25/2019: No growth x2 days --T-max 100.5 past 24 hours --Continue antibiotics with Unasyn 3 g IV q6h; await further culture identification and susceptibilities --Discussed with ID, Dr. Orvan Falconer; recommend continue IV antibiotics until afebrile and likely change to Augmentin to complete a 3-4-week course and will need follow-up CT in 3-4 weeks following discharge to ensure resolution.  Paroxysmal atrial fibrillation with RVR Patient was noted to have elevated heart rate on admission with EKG notable for A. fib with RVR.  No previous history.  Likely exacerbated by acute infectious process/pain. --Cardiology following, appreciate assistance --TTE 2/18: EF 55-60%, no evidence LV wall motion abnormalities --CHA2DS2/VASC = 2 (age and gender) --TSH 0.540; wnL --Started on a Cardizem drip; titrated off on 01/27/2019 --Metoprolol increased to 50 mg p.o. twice daily on 2/20 --Holding off anticoagulation as likely precipitating factor acute infectious process as above --Continue to monitor on telemetry  Elevated troponin Etiology likely demand ischemia in the setting of sepsis and A. fib with RVR as above. --Troponin flat/trending down; 0.72-->0.49-->0.47-->0.35 --Continue antibiotics for likely liver abscess and rate control as above --Continue to monitor on telemetry  Hypothyroidism TSH within normal range, 0.540.   --Continue Synthroid 150 mcg p.o. daily.  Chronic dyspnea She reports dyspnea, coughing, and wheezing for many years.  Reports no improvement with inhalers in the past and improves with Tessalon Perles.  No history of COPD/asthma. --Patient not hypoxic, oxygenating well on room air --Recommend pulmonary outpatient follow-up with PFTs and further work-up following discharge  Weakness/deconditioning --PT consulted for evaluation for discharge needs   DVT prophylaxis: Lovenox Code Status: Full code Family Communication:  None Disposition Plan: Continue inpatient level of care   Consultants:   Cardiology, Dr. Tenny Craw  Interventional radiology, Dr. Lowella Dandy  Procedures:   TTE 01/26/2019: 1. The left ventricle has normal systolic function, with an ejection fraction of 55-60%. The cavity size was normal. Mild asymmetric septal hypertrophy. Left ventricular diastolic parameters were normal No evidence of left ventricular regional wall  motion abnormalities.  2. The right ventricle has normal systolic function. The cavity was mildly enlarged. There is no increase in right ventricular wall thickness. Right ventricular systolic pressure could not be assessed.  3. The mitral valve is normal in structure. There is mild mitral annular calcification present.  4. The tricuspid valve is normal in structure.  5. The aortic valve is tricuspid.  6. The inferior vena cava was dilated in size with >50% respiratory variability.  7. No evidence of left ventricular regional wall motion abnormalities.  IR liver abscess aspiration on 01/27/2019  Antimicrobials:   Metronidazole 2/17 - 2/19  Unasyn 2/17>>   Subjective: Patient resting comfortably in bed.  Overnight patient went back into A. fib with RVR nonresponsive to 2 doses of IV metoprolol 5 mg.  Patient converted with Cardizem 20 mg IV x1.  Patient also noted to be slightly volume overload, net +5.5 L this admission.  Received IV Lasix overnight with good diuresis.  Continues with some mild abdominal discomfort but much improved.  Fever overnight max 100.5.  Continues on IV antibiotics for hepatic abscess, WBC count improving.  Hepatic abscess culture with no growth x24 hours.  Discussed with ID, recommend antibiotic course of 3-4 weeks with follow-up CT thereafter to assess resolution.  Continues with some mild shortness of breath/wheezing. No other complaints at this time.  Denies headache, no visual changes, no nausea/vomiting/diarrhea, no chest pain, no palpitations, no  chills/night sweats, no congestion, no issues with bowel/bladder function, no paresthesias.  No acute concerns overnight per nursing staff.  Objective: Vitals:   01/28/19 1816 01/28/19 2211 01/29/19 0019 01/29/19 0402  BP: (!) 153/96 123/76 109/65 101/66  Pulse: (!) 151 (!) 151 87 (!) 56  Resp: 20 (!) 22 (!) 24 18  Temp:  100.2 F (37.9 C) 98.6 F (37 C) 98.2 F (36.8 C)  TempSrc:  Oral Oral Oral  SpO2:  94% 98% 99%  Weight:      Height:        Intake/Output Summary (Last 24 hours) at 01/29/2019 1330 Last data filed at 01/29/2019 1100 Gross per 24 hour  Intake 940 ml  Output 2950 ml  Net -2010 ml   Filed Weights   01/25/19 1316  Weight: 108 kg    Examination:  General exam: Appears calm and comfortable  Respiratory system: Clear to auscultation. Respiratory effort normal. Cardiovascular system: S1 & S2 heard, RRR. No JVD, murmurs, rubs, gallops or clicks. No pedal edema. Gastrointestinal system: Abdomen is nondistended, soft, mild right upper quadrant tenderness. No organomegaly or masses felt. Normal bowel sounds heard. Central nervous system: Alert and oriented. No focal neurological deficits. Extremities: Symmetric 5 x 5 power. Skin: No rashes, lesions or ulcers Psychiatry: Judgement and insight appear normal. Mood & affect appropriate.     Data Reviewed: I have personally reviewed following labs and imaging studies  CBC: Recent Labs  Lab 01/25/19 1431 01/26/19 0523 01/27/19 0610 01/28/19 0602 01/29/19 0550  WBC 19.4* 18.1* 14.4* 12.8* 11.4*  NEUTROABS  16.6*  --   --   --   --   HGB 13.4 11.3* 10.6* 10.1* 11.5*  HCT 41.5 36.2 34.2* 33.1* 38.5  MCV 92.4 94.5 93.7 96.2 97.5  PLT 184 162 150 153 196   Basic Metabolic Panel: Recent Labs  Lab 01/25/19 1431 01/26/19 0523 01/27/19 0610 01/28/19 0602 01/29/19 0550  NA 134* 133* 137 137 138  K 3.8 3.9 4.1 3.8 3.4*  CL 101 102 106 105 102  CO2 23 20* 24 24 24   GLUCOSE 152* 147* 139* 134* 134*  BUN 13 21  18 19 22   CREATININE 0.90 1.01* 0.93 0.83 0.92  CALCIUM 8.8* 8.0* 8.0* 8.1* 8.0*   GFR: Estimated Creatinine Clearance: 66.1 mL/min (by C-G formula based on SCr of 0.92 mg/dL). Liver Function Tests: Recent Labs  Lab 01/26/19 0523 01/27/19 0610 01/28/19 0602  AST 43* 62* 32  ALT 41 76* 56*  ALKPHOS 82 87 81  BILITOT 1.5* 1.3* 1.0  PROT 6.4* 5.9* 6.1*  ALBUMIN 3.1* 2.7* 2.8*   No results for input(s): LIPASE, AMYLASE in the last 168 hours. No results for input(s): AMMONIA in the last 168 hours. Coagulation Profile: Recent Labs  Lab 01/27/19 0610  INR 1.45   Cardiac Enzymes: Recent Labs  Lab 01/25/19 1756 01/25/19 2107 01/26/19 0000 01/26/19 0523  TROPONINI 0.72* 0.49* 0.47* 0.35*   BNP (last 3 results) No results for input(s): PROBNP in the last 8760 hours. HbA1C: No results for input(s): HGBA1C in the last 72 hours. CBG: No results for input(s): GLUCAP in the last 168 hours. Lipid Profile: No results for input(s): CHOL, HDL, LDLCALC, TRIG, CHOLHDL, LDLDIRECT in the last 72 hours. Thyroid Function Tests: No results for input(s): TSH, T4TOTAL, FREET4, T3FREE, THYROIDAB in the last 72 hours. Anemia Panel: No results for input(s): VITAMINB12, FOLATE, FERRITIN, TIBC, IRON, RETICCTPCT in the last 72 hours. Sepsis Labs: Recent Labs  Lab 01/25/19 1431 01/26/19 1129 01/26/19 1359  LATICACIDVEN 1.2 1.1 1.3    Recent Results (from the past 240 hour(s))  Respiratory Panel by PCR     Status: None   Collection Time: 01/25/19  2:31 PM  Result Value Ref Range Status   Adenovirus NOT DETECTED NOT DETECTED Final   Coronavirus 229E NOT DETECTED NOT DETECTED Final    Comment: (NOTE) The Coronavirus on the Respiratory Panel, DOES NOT test for the novel  Coronavirus (2019 nCoV)    Coronavirus HKU1 NOT DETECTED NOT DETECTED Final   Coronavirus NL63 NOT DETECTED NOT DETECTED Final   Coronavirus OC43 NOT DETECTED NOT DETECTED Final   Metapneumovirus NOT DETECTED NOT  DETECTED Final   Rhinovirus / Enterovirus NOT DETECTED NOT DETECTED Final   Influenza A NOT DETECTED NOT DETECTED Final   Influenza B NOT DETECTED NOT DETECTED Final   Parainfluenza Virus 1 NOT DETECTED NOT DETECTED Final   Parainfluenza Virus 2 NOT DETECTED NOT DETECTED Final   Parainfluenza Virus 3 NOT DETECTED NOT DETECTED Final   Parainfluenza Virus 4 NOT DETECTED NOT DETECTED Final   Respiratory Syncytial Virus NOT DETECTED NOT DETECTED Final   Bordetella pertussis NOT DETECTED NOT DETECTED Final   Chlamydophila pneumoniae NOT DETECTED NOT DETECTED Final   Mycoplasma pneumoniae NOT DETECTED NOT DETECTED Final    Comment: Performed at [facility], [address], [place], [place] [address]  Blood Culture (routine x 2)     Status: None (Preliminary result)   Collection Time: 01/25/19  5:50 PM  Result Value Ref Range Status  Specimen Description BLOOD LEFT FOREARM  Final   Special Requests   Final    BOTTLES DRAWN AEROBIC AND ANAEROBIC Blood Culture adequate volume Performed at [facility], [address] [place], [place] [address]    Culture NO GROWTH 4 DAYS  Final   Report Status PENDING  Incomplete  Blood Culture (routine x 2)     Status: None (Preliminary result)   Collection Time: 01/25/19  5:54 PM  Result Value Ref Range Status   Specimen Description BLOOD RIGHT ANTECUBITAL  Final   Special Requests   Final    BOTTLES DRAWN AEROBIC AND ANAEROBIC Blood Culture adequate volume Performed at [facility], [address] [place], [place] [address]    Culture NO GROWTH 4 DAYS  Final   Report Status PENDING  Incomplete  Aerobic/Anaerobic Culture (surgical/deep wound)     Status: None (Preliminary result)   Collection Time: 01/27/19  5:32 PM  Result Value Ref Range Status   Specimen Description   Final    ABSCESS Performed at [facility], [address] [place], [place] [address]    Special Requests   Final      NONE Performed at [facility], [address] [place], [place] [address]    Gram Stain   Final    ABUNDANT WBC PRESENT,BOTH PMN AND MONONUCLEAR NO ORGANISMS SEEN Performed at [facility], [address] [place], [place] [address]    Culture NO GROWTH 2 DAYS  Final   Report Status PENDING  Incomplete         Radiology Studies: [place] Guided Needle Placement  Result Date: 01/27/2019 INDICATION: 73 year old with right upper quadrant pain, fever and elevated white blood cell count. Indeterminate hepatic lesion and concern for intrahepatic abscess. Plan for ultrasound-guided liver lesion aspiration. EXAM: ULTRASOUND-GUIDED LIVER LESION ASPIRATION MEDICATIONS: Moderate sedation medications ANESTHESIA/SEDATION: Fentanyl 50 mcg IV; Versed 1.0 mg IV Moderate Sedation Time:  14 minutes The patient was continuously monitored during the procedure by the interventional radiology nurse under my direct supervision. COMPLICATIONS: None immediate. PROCEDURE: Informed written consent was obtained from the patient after a thorough discussion of the procedural risks, benefits and alternatives. All questions were addressed. A timeout was performed prior to the initiation of the procedure. Liver was evaluated with ultrasound. Heterogeneous and slightly hyperechoic area in the anterior right hepatic lobe was identified. This area corresponds with the recent abnormalities on CT and MRI. Right upper abdomen was prepped with chlorhexidine and sterile field was created. Skin and soft tissues were anesthetized with 1% lidocaine. 18 gauge trocar needle was directed into this heterogeneous liver lesion. Approximately 4 mL of thick bloody fluid was aspirated. No additional fluid could be removed. Needle removed without complication. Bandage placed over the puncture site. FINDINGS: Heterogeneous and slightly hyperechoic lesion in the right hepatic lobe corresponding with the known abnormality.  Approximately 4 mL of thick bloody fluid was removed. No additional fluid could be aspirated. IMPRESSION: Ultrasound-guided aspiration of the indeterminate right hepatic lesion. 4 mL bloody fluid was removed. Fluid was sent for cytology and culture. Electronically Signed   By: Richarda OverlieAdam  Henn M.D.   On: 01/27/2019 17:45        Scheduled Meds: . enoxaparin (LOVENOX) injection  50 mg Subcutaneous Q24H  . ipratropium-albuterol  3 mL Nebulization Once  . levothyroxine  150 mcg Oral QHS  . metoprolol tartrate  50 mg Oral BID  . potassium chloride  20 mEq Oral  Daily   Continuous Infusions: . ampicillin-sulbactam (UNASYN) IV 3 g (01/29/19 1211)  . diltiazem (CARDIZEM) infusion Stopped (01/27/19 1123)     LOS: 4 days    Time spent: 32 minutes    Alvira Philips [place], DO [facility] Pager [phone]  If 7PM-7AM, please contact night-coverage [email or web address] Password [email or web address] 01/29/2019, 1:30 PM

## 2019-01-29 NOTE — Evaluation (Signed)
Physical Therapy Evaluation Patient Details Name: Abigail Ryan MRN: [id number] DOB: [birth year] Today's Date: 01/29/2019   History of Present Illness  73 yo female admitted to ED 2/17 with fevers, diagnosed with sepsis due to unknown cause. Pt with hepatic abscess, drained 2/19. Pt with runs of afib with RVR since hospitalized, elevated troponin (downtrending) suspect to be due to demand ischemia due to illness. PMH includes renal stones, hypothyroid.   Clinical Impression   Pt presents with LE deconditioning, difficulty performing mobility tasks, decreased cardiovascular endurance, and dyspnea on exertion. Pt to benefit from acute PT to address deficits. Pt ambulated 45 ft with RW, limited by fatigue. PT recommending HHPT to address deficits upon d/c from hospital. PT to progress mobility as tolerated, and will continue to follow acutely.   Pt desatted to 83% on RA sitting at EOB, pt states dyspnea is "normal" for her at home, and has been for a long time. Pt placed back on 2LO2 for ambulation, sats >90%.      Follow Up Recommendations Home health PT;Supervision for mobility/OOB    Equipment Recommendations  None recommended by PT    Recommendations for Other Services       Precautions / Restrictions Precautions Precautions: Fall Restrictions Weight Bearing Restrictions: No      Mobility  Bed Mobility Overal bed mobility: Needs Assistance             General bed mobility comments: Pt up in chair upon arrival to room, and wanted to be left in chair upon return to room. Pt on 2LO2 via [place]. Trialed RA sitting EOB, but within 2 minutes pt satting at 83%. Pt placed back on 2LO2 via [place] for ambulation.   Transfers Overall transfer level: Needs assistance Equipment used: Rolling walker (2 wheeled) Transfers: Sit to/from Stand Sit to Stand: Min assist         General transfer comment: Min assist for power up, pt with multiple rocks to build momentum to stand. Pt with  self-steadying upon standing.   Ambulation/Gait Ambulation/Gait assistance: Min guard Gait Distance (Feet): 45 Feet Assistive device: Rolling walker (2 wheeled) Gait Pattern/deviations: Step-through pattern;Decreased stride length;Trunk flexed Gait velocity: very decr    General Gait Details: Min guard for safety. Verbal cuing for placement in RW. Pt sats maintained >90% on 2LO2 during ambulation, HR up to 76 bpm in SR/afib per monitor. Dyspnea 2/4 with exertion.   Stairs            Wheelchair Mobility    Modified Rankin (Stroke Patients Only)       Balance Overall balance assessment: Needs assistance Sitting-balance support: No upper extremity supported Sitting balance-Leahy Scale: Good     Standing balance support: Bilateral upper extremity supported Standing balance-Leahy Scale: Fair Standing balance comment: able to stand without UE support, cannot accept challenge                              Pertinent Vitals/Pain Pain Assessment: No/denies pain    Home Living Family/patient expects to be discharged to:: Private residence Living Arrangements: Spouse/significant other Available Help at Discharge: Available PRN/intermittently;Family(pt's children assist as needed ) Type of Home: House Home Access: Stairs to enter Entrance Stairs-Rails: Right Entrance Stairs-Number of Steps: 2 Home Layout: One level Home Equipment: Cane - single point;Walker - 2 wheels;Bedside commode      Prior Function Level of Independence: Independent         Comments: 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 Fatigues with repeated AROM )     Cervical / Trunk Assessment Cervical / Trunk Assessment: Normal  Communication   Communication: No difficulties  Cognition Arousal/Alertness: Awake/alert Behavior During Therapy: WFL for tasks assessed/performed Overall Cognitive Status: Within Functional Limits for tasks assessed                                        General Comments      Exercises General Exercises - Lower Extremity Ankle Circles/Pumps: AROM;Both;10 reps;Seated Long Arc Quad: AROM;Both;10 reps;Seated Hip Flexion/Marching: AROM;Both;5 reps;Seated   Assessment/Plan    PT Assessment Patient needs continued PT services  PT Problem List Decreased strength;Pain;Decreased activity tolerance;Decreased knowledge of use of DME;Decreased balance;Decreased mobility       PT Treatment Interventions Gait training;Therapeutic exercise;DME instruction;Patient/family education;Stair training;Functional mobility training;Balance training;Therapeutic activities    PT Goals (Current goals can be found in the Care Plan section)  Acute Rehab PT Goals Patient Stated Goal: go home with family  PT Goal Formulation: With patient Time For Goal Achievement: 02/12/19 Potential to Achieve Goals: Good    Frequency     Barriers to discharge        Co-evaluation               AM-PAC PT "6 Clicks" Mobility  Outcome Measure Help needed turning from your back to your side while in a flat bed without using bedrails?: A Little Help needed moving from lying on your back to sitting on the side of a flat bed without using bedrails?: A Little Help needed moving to and from a bed to a chair (including a wheelchair)?: A Little Help needed standing up from a chair using your arms (e.g., wheelchair or bedside chair)?: A Little Help needed to walk in hospital room?: A Little Help needed climbing 3-5 steps with a railing? : A Little 6 Click Score: 18    End of Session Equipment Utilized During Treatment: Gait  belt;Oxygen Activity Tolerance: Patient limited by fatigue Patient left: in chair;with call bell/phone within reach Nurse Communication: Mobility status PT Visit Diagnosis: Other abnormalities of gait and mobility (R26.89);Difficulty in walking, not elsewhere classified (R26.2)    Time: 6948-5462 PT Time Calculation (min) (ACUTE ONLY): 25 min   Charges:   PT Evaluation $PT Eval Low Complexity: 1 Low PT Treatments $Gait Training: 8-22 mins       Nicola Police, PT Acute Rehabilitation Services Pager [phone]  Office [phone]   Tyrone Apple D Despina Hidden 01/29/2019, 6:31 PM

## 2019-01-29 NOTE — Progress Notes (Signed)
ANTICOAGULATION CONSULT NOTE - Initial Consult  Pharmacy Consult for rivaroxaban Indication: atrial fibrillation  Allergies  Allergen Reactions  . Codeine Nausea Only and Other (See Comments)    Extreme headaches also  . Morphine And Related Other (See Comments)    Extreme headaches    Patient Measurements: Height: 5' 5.5" (166.4 cm) Weight: 238 lb (108 kg) IBW/kg (Calculated) : 58.15  Vital Signs: Temp: 98.2 F (36.8 C) (02/21 0402) Temp Source: Oral (02/21 0402) BP: 101/66 (02/21 0402) Pulse Rate: 56 (02/21 0402)  Labs: Recent Labs    01/27/19 0610 01/28/19 0602 01/29/19 0550  HGB 10.6* 10.1* 11.5*  HCT 34.2* 33.1* 38.5  PLT 150 153 196  LABPROT 17.5*  --   --   INR 1.45  --   --   CREATININE 0.93 0.83 0.92    Estimated Creatinine Clearance: 66.1 mL/min (by C-G formula based on SCr of 0.92 mg/dL).   Medical History: Past Medical History:  Diagnosis Date  . Renal stones   . Thyroid disease    Assessment: 73 year old female admitted with cough, fever being treated for hepatic abscess and diagnosed with atrial fibrillation. Pharmacy consulted by cardiology to dose rivaroxaban for atrial fibrillation.   TBW = 108 kg CrCl using TBW ~ 75 mL/min  Assessment:   Hgb 11.5 - low but stable. Plt 196 - WNL.  Pt currently one enoxaparin 50 mg subQ daily for DVT ppx. Last dose 2/20 ~2200.   SCr 0.92, CrCl ~75 mL/min  Per MD notes, possible cirrhosis per CT imaging. Discussed with MD - no ascites or encephalopathy. Tbili and and LFT WNL.   Goal of Therapy:  Monitor platelets by anticoagulation protocol: Yes   Plan:   Discontinue enoxaparin  Start Rivaroxaban 20 mg PO daily with supper  Pt provided with manufacturer coupon and counseled on medication  Closely monitor for signs/symptoms of bleeding or thrombosis  Patient's renal function has been stable, do not anticipate dose change. Pharmacy to sign off. Will follow peripherally.   Cindi Carbon,  PharmD 01/29/2019,3:46 PM

## 2019-01-29 NOTE — Progress Notes (Addendum)
Progress Note  Patient Name: Abigail Ryan Date of Encounter: 01/29/2019  Primary Cardiologist: Dietrich Pates, MD   Subjective   Patient feels stronger today. Still has RUQ tenderness especially with coughing. She is having wheezing and coughing that she says she has been struggling with for years. She says inhalers make it worse but tessalon pearls help. She denies feeling any awareness of being in afib.   Inpatient Medications    Scheduled Meds: . enoxaparin (LOVENOX) injection  50 mg Subcutaneous Q24H  . ipratropium-albuterol  3 mL Nebulization Once  . levothyroxine  150 mcg Oral QHS  . metoprolol tartrate  50 mg Oral BID  . potassium chloride  20 mEq Oral Daily   Continuous Infusions: . ampicillin-sulbactam (UNASYN) IV 3 g (01/29/19 0542)  . diltiazem (CARDIZEM) infusion Stopped (01/27/19 1123)   PRN Meds: acetaminophen **OR** acetaminophen, benzonatate, fentaNYL (SUBLIMAZE) injection, guaiFENesin-dextromethorphan, iohexol, metoprolol tartrate, traMADol   Vital Signs    Vitals:   01/28/19 1816 01/28/19 2211 01/29/19 0019 01/29/19 0402  BP: (!) 153/96 123/76 109/65 101/66  Pulse: (!) 151 (!) 151 87 (!) 56  Resp: 20 (!) 22 (!) 24 18  Temp:  100.2 F (37.9 C) 98.6 F (37 C) 98.2 F (36.8 C)  TempSrc:  Oral Oral Oral  SpO2:  94% 98% 99%  Weight:      Height:        Intake/Output Summary (Last 24 hours) at 01/29/2019 0846 Last data filed at 01/29/2019 0700 Gross per 24 hour  Intake 940 ml  Output 2650 ml  Net -1710 ml   + 5. 3 L    + %Risk Evaluation and Mitigation Strategies (REMS)  The medication you are ordering is associated with Risk Evaluation and Mitigation Strategies (REMS) and has Elements to [employer] (ETASU).   Please use the link below to the FDA website to assure all ETASU are addressed for this medication.  [email or web address]-mooney.[email or web address] L    Last 3 Weights 01/25/2019 08/24/2015 08/22/2015  Weight (lbs) 238 lb  229 lb 6 oz 227 lb  Weight (kg) 107.956 kg 104.044 kg 102.967 kg      Telemetry    Patient was in sinus rhythm yesterday then developed A. fib with RVR that lasted from 4:30 PM to 4 AM then was in sinus rhythm in the 60s.  Patient had another episode of A. fib with RVR rates 120s to 140s from 8:40 AM to 11:00 this morning.  Now in sinus rhythm in the 60s-70s.- Personally Reviewed  ECG    No new tracings - Personally Reviewed  Physical Exam   GEN: Obese female, No acute distress.   Neck: Neck full  JVP is increased  Cardiac: RRR, no murmurs, rubs, or gallops.  Respiratory: Expiratory wheezes bilaterally GI: Soft, nontender, non-distended  MS: No edema; No deformity. Neuro:  Nonfocal  Psych: Normal affect   Labs    Chemistry Recent Labs  Lab 01/26/19 0523 01/27/19 0610 01/28/19 0602 01/29/19 0550  NA 133* 137 137 138  K 3.9 4.1 3.8 3.4*  CL 102 106 105 102  CO2 20* 24 24 24   GLUCOSE 147* 139* 134* 134*  BUN 21 18 19 22   CREATININE 1.01* 0.93 0.83 0.92  CALCIUM 8.0* 8.0* 8.1* 8.0*  PROT 6.4* 5.9* 6.1*  --   ALBUMIN 3.1* 2.7* 2.8*  --   AST 43* 62* 32  --   ALT 41 76* 56*  --   ALKPHOS 82 87 81  --  BILITOT 1.5* 1.3* 1.0  --   GFRNONAA 55* >60 >60 >60  GFRAA >60 >60 >60 >60  ANIONGAP 11 7 8 12      Hematology Recent Labs  Lab 01/27/19 0610 01/28/19 0602 01/29/19 0550  WBC 14.4* 12.8* 11.4*  RBC 3.65* 3.44* 3.95  HGB 10.6* 10.1* 11.5*  HCT 34.2* 33.1* 38.5  MCV 93.7 96.2 97.5  MCH 29.0 29.4 29.1  MCHC 31.0 30.5 29.9*  RDW 14.2 14.5 14.3  PLT 150 153 196    Cardiac Enzymes Recent Labs  Lab 01/25/19 1756 01/25/19 2107 01/26/19 0000 01/26/19 0523  TROPONINI 0.72* 0.49* 0.47* 0.35*    Recent Labs  Lab 01/25/19 1444  TROPIPOC 0.40*     BNPNo results for input(s): BNP, PROBNP in the last 168 hours.   DDimer No results for input(s): DDIMER in the last 168 hours.   Radiology    US Guided Needle Placement  Result Date:  01/27/2019 INDICATION: 73 year old with right upper quadrant pain, fever and elevated white blood cell count. Indeterminate hepatic lesion and concern for intrahepatic abscess. Plan for ultrasound-guided liver lesion aspiration. EXAM: ULTRASOUND-GUIDED LIVER LESION ASPIRATION MEDICATIONS: Moderate sedation medications ANESTHESIA/SEDATION: Fentanyl 50 mcg IV; Versed 1.0 mg IV Moderate Sedation Time:  14 minutes The patient was continuously monitored during the procedure by the interventional radiology nurse under my direct supervision. COMPLICATIONS: None immediate. PROCEDURE: Informed written consent was obtained from the patient after a thorough discussion of the procedural risks, benefits and alternatives. All questions were addressed. A timeout was performed prior to the initiation of the procedure. Liver was evaluated with ultrasound. Heterogeneous and slightly hyperechoic area in the anterior right hepatic lobe was identified. This area corresponds with the recent abnormalities on CT and MRI. Right upper abdomen was prepped with chlorhexidine and sterile field was created. Skin and soft tissues were anesthetized with 1% lidocaine. 18 gauge trocar needle was directed into this heterogeneous liver lesion. Approximately 4 mL of thick bloody fluid was aspirated. No additional fluid could be removed. Needle removed without complication. Bandage placed over the puncture site. FINDINGS: Heterogeneous and slightly hyperechoic lesion in the right hepatic lobe corresponding with the known abnormality. Approximately 4 mL of thick bloody fluid was removed. No additional fluid could be aspirated. IMPRESSION: Ultrasound-guided aspiration of the indeterminate right hepatic lesion. 4 mL bloody fluid was removed. Fluid was sent for cytology and culture. Electronically Signed   By: Richarda Overlie M.D.   On: 01/27/2019 17:45   Mr Liver W Wo Contrast  Result Date: 01/27/2019 CLINICAL DATA:  Evaluate possible liver abscess. Fever.  Abdominal tenderness EXAM: MRI ABDOMEN WITHOUT AND WITH CONTRAST TECHNIQUE: Multiplanar multisequence MR imaging of the abdomen was performed both before and after the administration of intravenous contrast. CONTRAST:  10 mL Gadavist COMPARISON:  CT 01/26/2019, 08/16/2015 FINDINGS: Lower chest:  Lung bases are clear. Hepatobiliary: Within the RIGHT hepatic lobe, there is a rounded lesion which is mixed high signal intensity on T2 weighted imaging measuring 5.0 x 4.8 cm (image 31/3). There is a rim of more diffuse high signal intensity around the lesion suggesting edema within the liver parenchyma. On precontrast T1 weighted imaging lesion is hypointense. On postcontrast imaging there is thin peripheral enhancing rim and enhancing internal septations. The majority of the internal aspect of the lesion is nonenhancing (image series 1002 and 1003). The peripheral rim is mildly enhancing. On diffusion-weighted imaging, lesion is hyperintense and mildly hypointense on the ADC map. No biliary duct dilatation. Postcholecystectomy. Normal common bile  duct. Pancreas: Normal pancreatic parenchymal intensity. No ductal dilatation or inflammation. Spleen: Normal spleen. Adrenals/urinary tract: Adrenal glands and kidneys are normal. Stomach/Bowel: Stomach and limited of the small bowel is unremarkable Vascular/Lymphatic: Abdominal aortic normal caliber. No retroperitoneal periportal lymphadenopathy. Musculoskeletal: No aggressive osseous lesion IMPRESSION: Round septated lesion in the RIGHT hepatic lobe with peripheral rim of enhancing edema is most consistent with a HEPATIC ABSCESS. Less favored differential would include a cystic neoplasm or necrotic lesion such is biliary cystadenoma, mucinous lesion, or necrotic metastasis. Electronically Signed   By: Genevive Bi M.D.   On: 01/27/2019 13:58    Cardiac Studies   Echocardiogram 01/26/2019 IMPRESSIONS 1. The left ventricle has normal systolic function, with an  ejection fraction of 55-60%. The cavity size was normal. Mild asymmetric septal hypertrophy. Left ventricular diastolic parameters were normal No evidence of left ventricular regional wall  motion abnormalities. 2. The right ventricle has normal systolic function. The cavity was mildly enlarged. There is no increase in right ventricular wall thickness. Right ventricular systolic pressure could not be assessed. 3. The mitral valve is normal in structure. There is mild mitral annular calcification present. 4. The tricuspid valve is normal in structure. 5. The aortic valve is tricuspid. 6. The inferior vena cava was dilated in size with >50% respiratory variability. 7. No evidence of left ventricular regional wall motion abnormalities.  SUMMARY Normal LV EF. No significant wall motion abnormalities noted, though technical limitations reduce sensitivity for mild abnormalities. No significant valve disease. FINDINGS Left Ventricle: The left ventricle has normal systolic function, with an ejection fraction of 55-60%. The cavity size was normal. Mild asymmetric septal hypertrophy. Left ventricular diastolic parameters were normal No evidence of left ventricular  regional wall motion abnormalities.. Right Ventricle: The right ventricle has normal systolic function. The cavity was mildly enlarged. There is no increase in right ventricular wall thickness. Right ventricular systolic pressure could not be assessed.  Patient Profile     73 y.o. female with a history of hypothyroid and nephrolithiasis. No prior cardiac history. Admitted 01/25/19 with fever up to 105 and cough. 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  We were consulted for elevated troponin and atrial fibrillation.  Assessment & Plan    Atrial fibrillation with RVR -No prior known history of A. Fib.  -Converted to sinus rhythm on IV diltiazem and beta-blocker, dilt weaned off on 2/19. Pt went back into afib yesterday afternoon.  And had  some more A. fib with RVR this morning. (120s to 150s)   Extra doses of cardizem 20 mg IV given. Metoprolol increased to 50 mg BID.  Now in sinus rhythm in the 60s-70s. Pt had no awareness of being in afib.  -BP is soft but stable.  -CHA2DS2/VAS Stroke RiskScore is 2 for age and sex. She should be on long term anticoagulation   Would recomm Xarelto  Mild volume overload -Pt with elevated neck veins, trace edema and +5.5L overall fluid balance since admission.  Pt got 1 dose of IV lasix yesterday with good urine output, 2.6L.  -Breathing is a little better today. She is wheezing and coughing.  Would give additional lasix today   Check labs in AM    Elevated troponin -Troponinwas mildly elevated at 0.72 and trended down to 0.35.  -In setting of sepsis with fever up to 105 and tachycardia. No substernal chest pain or prior known cardiac disease,She does have somechest pain with cough. -Echocardiogram showed normal LV systolic function with no wallregionalmotion abnormalities, Bilateral atria were  normal in size. -Troponin elevation most likely due to demand ischemia in setting of sepsis.   Sepsis -Finding of hepatic mass on CT and MRI was most consistent with hepatic abscess.  Leukocytosis and fever.  Underwent ultrasound-guided liver lesion aspiration yesterday. Small amount of fluid taken   Labs pending. -Management per primary team.  On IV antibiotics. -Right upper quadrant pain has improved, WBC is coming down, still having low grade fevers.   For questions or updates, please contact [facility] Please consult [email or web address] for contact info under        Signed, Berton BonJanine Hammond, NP  01/29/2019, 8:46 AM     Pt seen and examined  I have amended note above by  Cleotis NipperJ Hammond to reflect my findings Pt had 2 periods of atrial fibrillation over the past 24 hours   Rate 120s to 150s   I cannot blame afib on acute illness as she is clinically improving   Has low grade fever only and  WBC minimally increased  She does no sense palpitations    Indeed she may have more than she knows (prior to admit)  ON exam Neck;   JVP is increased   Lungs  Mild wheezing  Cardiac RRR   No S3 Ext with triv edema  RECOMM:  Will need long term anticoagulation  (CHADSVASC is 3) Would try using b blocker and diltiazem for now If recurs at fast rate may end up on antiarrhythmic  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 Will need to have long term f/u in cardiology for this    Would give IV lasix x 1 again with KCL   Volume is still up   Some wheezing  (prob reflects some of diastolic dysfunction in setting of afib with RVR)  Dietrich PatesPaula Ross MD

## 2019-01-30 LAB — CULTURE, BLOOD (ROUTINE X 2)
Culture: NO GROWTH
Culture: NO GROWTH
Special Requests: ADEQUATE
Special Requests: ADEQUATE

## 2019-01-30 LAB — CBC
HCT: 33.7 % — ABNORMAL LOW (ref 36.0–46.0)
Hemoglobin: 10.2 g/dL — ABNORMAL LOW (ref 12.0–15.0)
MCH: 28.5 pg (ref 26.0–34.0)
MCHC: 30.3 g/dL (ref 30.0–36.0)
MCV: 94.1 fL (ref 80.0–100.0)
PLATELETS: 217 10*3/uL (ref 150–400)
RBC: 3.58 MIL/uL — ABNORMAL LOW (ref 3.87–5.11)
RDW: 14.4 % (ref 11.5–15.5)
WBC: 8.7 10*3/uL (ref 4.0–10.5)
nRBC: 0 % (ref 0.0–0.2)

## 2019-01-30 LAB — BASIC METABOLIC PANEL
Anion gap: 8 (ref 5–15)
BUN: 21 mg/dL (ref 8–23)
CO2: 31 mmol/L (ref 22–32)
Calcium: 8.4 mg/dL — ABNORMAL LOW (ref 8.9–10.3)
Chloride: 100 mmol/L (ref 98–111)
Creatinine, Ser: 1.01 mg/dL — ABNORMAL HIGH (ref 0.44–1.00)
GFR calc Af Amer: >60 mL/min (ref >60)
GFR, EST NON AFRICAN AMERICAN: 55 mL/min — AB (ref >60)
GLUCOSE: 126 mg/dL — AB (ref 70–99)
Potassium: 3.6 mmol/L (ref 3.5–5.1)
Sodium: 139 mmol/L (ref 135–145)

## 2019-01-30 LAB — BRAIN NATRIURETIC PEPTIDE: B Natriuretic Peptide: 295.3 pg/mL — ABNORMAL HIGH (ref 0.0–100.0)

## 2019-01-30 LAB — MAGNESIUM: Magnesium: 2 mg/dL (ref 1.7–2.4)

## 2019-01-30 MED ORDER — FUROSEMIDE 10 MG/ML IJ SOLN
60.0000 mg | Freq: Two times a day (BID) | INTRAMUSCULAR | Status: DC
  Administered 2019-01-30 – 2019-01-31 (×2): 60 mg via INTRAVENOUS
  Filled 2019-01-30 (×2): qty 6

## 2019-01-30 MED ORDER — FUROSEMIDE 10 MG/ML IJ SOLN
40.0000 mg | Freq: Two times a day (BID) | INTRAMUSCULAR | Status: DC
  Administered 2019-01-30: 40 mg via INTRAVENOUS
  Filled 2019-01-30: qty 4

## 2019-01-30 NOTE — Progress Notes (Signed)
Patient did well overnight. Max temp was 100.1 and was treated with prn Tylenol. Patient continues to complain of cough, shortness of breath, and Rt abd pain, but slept well overnight after prns given. Will continue to monitor patient.

## 2019-01-30 NOTE — Progress Notes (Signed)
PROGRESS NOTE    Abigail Ryan  ZOX:[id number] DOB: 07/03/1946 DOA: 01/25/2019 PCP: Kaleen Mask, MD   Chief complaint: Fever   Brief Narrative:   Abigail Ryan is a 73 y.o. female with medical history significant of hypothyroid presents to ED with 1 day hx of mild productive cough, chest pains. Denies sick contacts. Reports sitting watching TV around 5pm on day prior to admit when pt suddenly felt flushed and feverish. Temp note to be as high as 105F. Fevers persisted overnight into day of admit. Initially denied cough, however later reported mild cough productive of thick sputum. Denies diarrhea or neck stiffness.  ED Course: In the ED, pt note to be febrile to 104F improved with tylenol. CXR found to be clear. UA and blood cultures were ordered, pending. Flu was found to be neg. Patient was started on empiric azithromycin and rocephin. Pt also noted to have trop of 0.4 with no ischemic changes on EKG. Cardiology consulted by EDP. Hospitalist consulted for consideration for admission. Of note, patient has since reported feeling much better after tylenol and one dose of toradol.   Assessment & Plan:   Principal Problem:   Sepsis (HCC) Active Problems:   Hypothyroid   Elevated troponin   Atrial fibrillation with RVR (HCC)   Liver abscess   Sepsis; POA Hepatic abscess Patient presents with fever up to 105 at home with right upper quadrant pain.  CT abdomen/pelvis notable for a 4.1 x 4.0 low density lesion peripheral right liver consistent with possible abscess.  Also noted nodular texture to the liver consistent with possible cirrhosis.  Total bilirubin elevated 1.5 with AST of 43 and ALT of 41.  Patient was febrile to 104.0 in the ED with a white blood cell count of 18.1.  Lactic acid 1.3. --MRI abdomen w/ right hepatic lobe 5.0 x 4.8 cm lesion consistent with a hepatic abscess --s/p IR drainage/aspiration on 2/19; cultures without growth x24 hours --WBC trending down  18.1-->14.4-->12.8-->11.4 --Blood cultures x2 01/25/2019: No growth x2 days --T-max 100.1 past 24 hours --Continue antibiotics with Unasyn 3 g IV q6h; await further culture identification and susceptibilities --Discussed with ID, Dr. Orvan Falconer; recommend continue IV antibiotics until afebrile and likely change to Augmentin to complete a 3-4 week course and will need follow-up CT in 3-4 weeks following discharge to ensure resolution.  Paroxysmal atrial fibrillation with RVR Patient was noted to have elevated heart rate on admission with EKG notable for A. fib with RVR.  No previous history.  Likely exacerbated by acute infectious process/pain. --Cardiology following, appreciate assistance --TTE 2/18: EF 55-60%, no evidence LV wall motion abnormalities --CHA2DS2/VASC = 2 (age and gender) --TSH 0.540; wnL --Started on a Cardizem drip; titrated off on 01/27/2019 --Metoprolol increased to 50 mg p.o. twice daily on 2/20 --started on Xarelto for anticoagulation --Continue to monitor on telemetry  Elevated troponin Etiology likely demand ischemia in the setting of sepsis and A. fib with RVR as above. --Troponin flat/trending down; 0.72-->0.49-->0.47-->0.35 --Continue antibiotics for likely liver abscess and rate control as above --Continue to monitor on telemetry  Hypothyroidism TSH within normal range, 0.540.   --Continue Synthroid 150 mcg p.o. daily.  Chronic dyspnea Volume Overload She reports dyspnea, coughing, and wheezing for many years.  Reports no improvement with inhalers in the past and improves with Tessalon Perles.  No history of COPD/asthma. Continues on supplemental oxygen, 3L [place]; de-sat to 83% yesterday with PT. --net positive 4L since admission --s/p IV lasix  by cardiology  yesterday with net negative 1.3L fluid balance past 24h --will continue diuresis with IV lasix  BID for next two days --daily weights; strict I/O's --Will likely pulmonary outpatient follow-up with  PFTs and further work-up following discharge  Weakness/deconditioning --PT following   DVT prophylaxis: Lovenox Code Status: Full code Family Communication: None Disposition Plan: Continue inpatient level of care   Consultants:   Cardiology, Dr. Tenny Craw  Interventional radiology, Dr. Lowella Dandy  Procedures:   TTE 01/26/2019: 1. The left ventricle has normal systolic function, with an ejection fraction of 55-60%. The cavity size was normal. Mild asymmetric septal hypertrophy. Left ventricular diastolic parameters were normal No evidence of left ventricular regional wall  motion abnormalities.  2. The right ventricle has normal systolic function. The cavity was mildly enlarged. There is no increase in right ventricular wall thickness. Right ventricular systolic pressure could not be assessed.  3. The mitral valve is normal in structure. There is mild mitral annular calcification present.  4. The tricuspid valve is normal in structure.  5. The aortic valve is tricuspid.  6. The inferior vena cava was dilated in size with >50% respiratory variability.  7. No evidence of left ventricular regional wall motion abnormalities.  IR liver abscess aspiration on 01/27/2019  Antimicrobials:   Metronidazole 2/17 - 2/19  Unasyn 2/17>>   Subjective: Patient resting comfortably in bed.  SOB and RUQ improving but persists with dyspnea, now on O2. Good diuresis yesterday with IV lasix. TMAX 100.1 past 24h, and wbc count now normalized. No other complaints at this time.  Denies headache, no visual changes, no nausea/vomiting/diarrhea, no chest pain, no palpitations, no chills/night sweats, no congestion, no issues with bowel/bladder function, no paresthesias.  No acute concerns overnight per nursing staff.  Objective: Vitals:   01/29/19 2341 01/30/19 0504 01/30/19 0614 01/30/19 0845  BP:  (!) 127/58  126/66  Pulse:  (!) 59  70  Resp:  16    Temp: 99.1 F (37.3 C) 98.7 F (37.1 C)    TempSrc: Oral  Oral    SpO2:  99% 98%   Weight:      Height:        Intake/Output Summary (Last 24 hours) at 01/30/2019 0908 Last data filed at 01/30/2019 0700 Gross per 24 hour  Intake 880 ml  Output 2450 ml  Net -1570 ml   Filed Weights   01/25/19 1316  Weight: 108 kg    Examination:  General exam: Appears calm and comfortable  Respiratory system: Clear to auscultation. Respiratory effort normal. Cardiovascular system: S1 & S2 heard, RRR. No JVD, murmurs, rubs, gallops or clicks. No pedal edema. Gastrointestinal system: Abdomen is nondistended, soft, mild right upper quadrant tenderness. No organomegaly or masses felt. Normal bowel sounds heard. Central nervous system: Alert and oriented. No focal neurological deficits. Extremities: Symmetric 5 x 5 power. Skin: No rashes, lesions or ulcers Psychiatry: Judgement and insight appear normal. Mood & affect appropriate.     Data Reviewed: I have personally reviewed following labs and imaging studies  CBC: Recent Labs  Lab 01/25/19 1431 01/26/19 0523 01/27/19 0610 01/28/19 0602 01/29/19 0550 01/30/19 0515  WBC 19.4* 18.1* 14.4* 12.8* 11.4* 8.7  NEUTROABS 16.6*  --   --   --   --   --   HGB 13.4 11.3* 10.6* 10.1* 11.5* 10.2*  HCT 41.5 36.2 34.2* 33.1* 38.5 33.7*  MCV 92.4 94.5 93.7 96.2 97.5 94.1  PLT 184 162 150 153 196 217   Basic Metabolic Panel: Recent  Labs  Lab 01/26/19 0523 01/27/19 0610 01/28/19 0602 01/29/19 0550 01/30/19 0515  NA 133* 137 137 138 139  K 3.9 4.1 3.8 3.4* 3.6  CL 102 106 105 102 100  CO2 20* 24 24 24 31   GLUCOSE 147* 139* 134* 134* 126*  BUN 21 18 19 22 21   CREATININE 1.01* 0.93 0.83 0.92 1.01*  CALCIUM 8.0* 8.0* 8.1* 8.0* 8.4*  MG  --   --   --   --  2.0   GFR: Estimated Creatinine Clearance: 60.3 mL/min (A) (by C-G formula based on SCr of 1.01 mg/dL (H)). Liver Function Tests: Recent Labs  Lab 01/26/19 0523 01/27/19 0610 01/28/19 0602  AST 43* 62* 32  ALT 41 76* 56*  ALKPHOS 82 87 81    BILITOT 1.5* 1.3* 1.0  PROT 6.4* 5.9* 6.1*  ALBUMIN 3.1* 2.7* 2.8*   No results for input(s): LIPASE, AMYLASE in the last 168 hours. No results for input(s): AMMONIA in the last 168 hours. Coagulation Profile: Recent Labs  Lab 01/27/19 0610  INR 1.45   Cardiac Enzymes: Recent Labs  Lab 01/25/19 1756 01/25/19 2107 01/26/19 0000 01/26/19 0523  TROPONINI 0.72* 0.49* 0.47* 0.35*   BNP (last 3 results) No results for input(s): PROBNP in the last 8760 hours. HbA1C: No results for input(s): HGBA1C in the last 72 hours. CBG: No results for input(s): GLUCAP in the last 168 hours. Lipid Profile: No results for input(s): CHOL, HDL, LDLCALC, TRIG, CHOLHDL, LDLDIRECT in the last 72 hours. Thyroid Function Tests: No results for input(s): TSH, T4TOTAL, FREET4, T3FREE, THYROIDAB in the last 72 hours. Anemia Panel: No results for input(s): VITAMINB12, FOLATE, FERRITIN, TIBC, IRON, RETICCTPCT in the last 72 hours. Sepsis Labs: Recent Labs  Lab 01/25/19 1431 01/26/19 1129 01/26/19 1359  LATICACIDVEN 1.2 1.1 1.3    Recent Results (from the past 240 hour(s))  Respiratory Panel by PCR     Status: None   Collection Time: 01/25/19  2:31 PM  Result Value Ref Range Status   Adenovirus NOT DETECTED NOT DETECTED Final   Coronavirus 229E NOT DETECTED NOT DETECTED Final    Comment: (NOTE) The Coronavirus on the Respiratory Panel, DOES NOT test for the novel  Coronavirus (2019 nCoV)    Coronavirus HKU1 NOT DETECTED NOT DETECTED Final   Coronavirus NL63 NOT DETECTED NOT DETECTED Final   Coronavirus OC43 NOT DETECTED NOT DETECTED Final   Metapneumovirus NOT DETECTED NOT DETECTED Final   Rhinovirus / Enterovirus NOT DETECTED NOT DETECTED Final   Influenza A NOT DETECTED NOT DETECTED Final   Influenza B NOT DETECTED NOT DETECTED Final   Parainfluenza Virus 1 NOT DETECTED NOT DETECTED Final   Parainfluenza Virus 2 NOT DETECTED NOT DETECTED Final   Parainfluenza Virus 3 NOT DETECTED NOT  DETECTED Final   Parainfluenza Virus 4 NOT DETECTED NOT DETECTED Final   Respiratory Syncytial Virus NOT DETECTED NOT DETECTED Final   Bordetella pertussis NOT DETECTED NOT DETECTED Final   Chlamydophila pneumoniae NOT DETECTED NOT DETECTED Final   Mycoplasma pneumoniae NOT DETECTED NOT DETECTED Final    Comment: Performed at [facility], [address], [place], [place] [address]  Blood Culture (routine x 2)     Status: None (Preliminary result)   Collection Time: 01/25/19  5:50 PM  Result Value Ref Range Status   Specimen Description BLOOD LEFT FOREARM  Final   Special Requests   Final    BOTTLES DRAWN AEROBIC AND ANAEROBIC Blood Culture adequate volume Performed at  [facility], [address], [place], [place] [address]    Culture NO GROWTH 4 DAYS  Final   Report Status PENDING  Incomplete  Blood Culture (routine x 2)     Status: None (Preliminary result)   Collection Time: 01/25/19  5:54 PM  Result Value Ref Range Status   Specimen Description BLOOD RIGHT ANTECUBITAL  Final   Special Requests   Final    BOTTLES DRAWN AEROBIC AND ANAEROBIC Blood Culture adequate volume Performed at [facility], [address], [place], [place] [address]    Culture NO GROWTH 4 DAYS  Final   Report Status PENDING  Incomplete  Aerobic/Anaerobic Culture (surgical/deep wound)     Status: None (Preliminary result)   Collection Time: 01/27/19  5:32 PM  Result Value Ref Range Status   Specimen Description   Final    ABSCESS Performed at [facility], [address] [place], [place] [address]    Special Requests   Final    NONE Performed at [facility] [address] [place], [place] [address]    Gram Stain   Final    ABUNDANT WBC PRESENT,BOTH PMN AND MONONUCLEAR NO ORGANISMS SEEN Performed at [facility], [address], [place], [place] [address]    Culture   Final    NO GROWTH 2 DAYS NO ANAEROBES  ISOLATED; CULTURE IN PROGRESS FOR 5 DAYS   Report Status PENDING  Incomplete         Radiology Studies: No results found.      Scheduled Meds: . albuterol  2.5 mg Nebulization BID  . diltiazem  60 mg Oral Q8H  . furosemide  40 mg Intravenous BID  . ipratropium-albuterol  3 mL Nebulization Once  . levothyroxine  150 mcg Oral QHS  . metoprolol tartrate  50 mg Oral BID  . potassium chloride  20 mEq Oral Daily  . rivaroxaban  20 mg Oral Q supper   Continuous Infusions: . ampicillin-sulbactam (UNASYN) IV 3 g (01/30/19 0605)  . diltiazem (CARDIZEM) infusion Stopped (01/27/19 1123)     LOS: 5 days    Time spent: 32 minutes    Alvira Philips [place], DO [facility] Pager [phone]  If 7PM-7AM, please contact night-coverage [email or web address] Password [email or web address] 01/30/2019, 9:08 AM

## 2019-01-30 NOTE — Progress Notes (Signed)
Patient c/o chest pain in left chest/epigastric area. Pain described as mild pressure rated at 4/10. She reports slight relief after coughing. VSS, EKG unchanged from previous. Pain resolved within a few minutes.

## 2019-01-30 NOTE — Progress Notes (Addendum)
Progress Note  Patient Name: Abigail Ryan Date of Encounter: 01/30/2019  Primary Cardiologist: Dietrich Pates, MD   Subjective   Pt not feeling as good as yesterday  Coughing more   Had some chest prssure earlier   Gone   Still with R sided pain   Breathing is rel unchanged     Inpatient Medications    Scheduled Meds: . albuterol  2.5 mg Nebulization BID  . diltiazem  60 mg Oral Q8H  . furosemide  40 mg Intravenous BID  . ipratropium-albuterol  3 mL Nebulization Once  . levothyroxine  150 mcg Oral QHS  . metoprolol tartrate  50 mg Oral BID  . potassium chloride  20 mEq Oral Daily  . rivaroxaban  20 mg Oral Q supper   Continuous Infusions: . ampicillin-sulbactam (UNASYN) IV 3 g (01/30/19 0605)  . diltiazem (CARDIZEM) infusion Stopped (01/27/19 1123)   PRN Meds: acetaminophen **OR** acetaminophen, benzonatate, fentaNYL (SUBLIMAZE) injection, guaiFENesin-dextromethorphan, iohexol, ipratropium-albuterol, traMADol   Vital Signs    Vitals:   01/29/19 2341 01/30/19 0504 01/30/19 0614 01/30/19 0845  BP:  (!) 127/58  126/66  Pulse:  (!) 59  70  Resp:  16    Temp: 99.1 F (37.3 C) 98.7 F (37.1 C)    TempSrc: Oral Oral    SpO2:  99% 98%   Weight:      Height:        Intake/Output Summary (Last 24 hours) at 01/30/2019 1155 Last data filed at 01/30/2019 0700 Gross per 24 hour  Intake 880 ml  Output 2150 ml  Net -1270 ml   + 4.2 L    Last 3 Weights 01/25/2019 08/24/2015 08/22/2015  Weight (lbs) 238 lb 229 lb 6 oz 227 lb  Weight (kg) 107.956 kg 104.044 kg 102.967 kg      Telemetry    SR    ECG    No new tracings - Personally Reviewed  Physical Exam   GEN: Obese female, No acute distress.   Neck: JVP is increased  Cardiac: RRR, no murmurs, rubs, or gallops.  Respiratory: No exp wheezes  GI: Soft, nontender, non-distended  MS: No edema; No deformity. Neuro:  Nonfocal  Psych: Normal affect   Labs    Chemistry Recent Labs  Lab 01/26/19 0523  01/27/19 0610 01/28/19 0602 01/29/19 0550 01/30/19 0515  NA 133* 137 137 138 139  K 3.9 4.1 3.8 3.4* 3.6  CL 102 106 105 102 100  CO2 20* 24 24 24 31   GLUCOSE 147* 139* 134* 134* 126*  BUN 21 18 19 22 21   CREATININE 1.01* 0.93 0.83 0.92 1.01*  CALCIUM 8.0* 8.0* 8.1* 8.0* 8.4*  PROT 6.4* 5.9* 6.1*  --   --   ALBUMIN 3.1* 2.7* 2.8*  --   --   AST 43* 62* 32  --   --   ALT 41 76* 56*  --   --   ALKPHOS 82 87 81  --   --   BILITOT 1.5* 1.3* 1.0  --   --   GFRNONAA 55* >60 >60 >60 55*  GFRAA >60 >60 >60 >60 >60  ANIONGAP 11 7 8 12 8      Hematology Recent Labs  Lab 01/28/19 0602 01/29/19 0550 01/30/19 0515  WBC 12.8* 11.4* 8.7  RBC 3.44* 3.95 3.58*  HGB 10.1* 11.5* 10.2*  HCT 33.1* 38.5 33.7*  MCV 96.2 97.5 94.1  MCH 29.4 29.1 28.5  MCHC 30.5 29.9* 30.3  RDW 14.5 14.3  14.4  PLT 153 196 217    Cardiac Enzymes Recent Labs  Lab 01/25/19 1756 01/25/19 2107 01/26/19 0000 01/26/19 0523  TROPONINI 0.72* 0.49* 0.47* 0.35*    Recent Labs  Lab 01/25/19 1444  TROPIPOC 0.40*     BNPNo results for input(s): BNP, PROBNP in the last 168 hours.   DDimer No results for input(s): DDIMER in the last 168 hours.   Radiology    No results found.  Cardiac Studies   Echocardiogram 01/26/2019 IMPRESSIONS 1. The left ventricle has normal systolic function, with an ejection fraction of 55-60%. The cavity size was normal. Mild asymmetric septal hypertrophy. Left ventricular diastolic parameters were normal No evidence of left ventricular regional wall  motion abnormalities. 2. The right ventricle has normal systolic function. The cavity was mildly enlarged. There is no increase in right ventricular wall thickness. Right ventricular systolic pressure could not be assessed. 3. The mitral valve is normal in structure. There is mild mitral annular calcification present. 4. The tricuspid valve is normal in structure. 5. The aortic valve is tricuspid. 6. The inferior vena cava  was dilated in size with >50% respiratory variability. 7. No evidence of left ventricular regional wall motion abnormalities.  SUMMARY Normal LV EF. No significant wall motion abnormalities noted, though technical limitations reduce sensitivity for mild abnormalities. No significant valve disease. FINDINGS Left Ventricle: The left ventricle has normal systolic function, with an ejection fraction of 55-60%. The cavity size was normal. Mild asymmetric septal hypertrophy. Left ventricular diastolic parameters were normal No evidence of left ventricular  regional wall motion abnormalities.. Right Ventricle: The right ventricle has normal systolic function. The cavity was mildly enlarged. There is no increase in right ventricular wall thickness. Right ventricular systolic pressure could not be assessed.  Patient Profile     73 y.o. female with a history of hypothyroid and nephrolithiasis. No prior cardiac history. Admitted 01/25/19 with fever up to 105 and cough. 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  We were consulted for elevated troponin and atrial fibrillation.  Assessment & Plan    Atrial fibrillation with RVR  CHADVASc of 3  No furhter afib after yesterday    Xarelto started   Tolerating lopressor and diltiazem Continue tlelemetry  Acute diastolic CHF Pt had rapid afib plus received fluid resuscitation earlier in admt   Diuresing   REnal function is stable   Still with increase on exam   Elevated troponin -Troponinwas mildly elevated at 0.72 and trended down to 0.35.  -In setting of sepsis with fever up to 105 and tachycardia. No substernal chest pain or prior known cardiac disease,She does have somechest pain with cough. -Echocardiogram showed normal LV systolic function with no wallregionalmotion abnormalities, Bilateral atria were normal in size. -Troponin elevation most likely due to demand ischemia in setting of sepsis.   Sepsis -Finding of hepatic mass on CT and MRI was  most consistent with hepatic abscess.  Leukocytosis and fever.  Underwent ultrasound-guided liver lesion aspiration yesterday. Small amount of fluid taken   Labs pending. -Management per primary team.  On IV antibiotics.   For questions or updates, please contact [facility] Please consult [email or web address] for contact info under        Signed, Dietrich Pates, MD  01/30/2019, 11:55 AM

## 2019-01-31 LAB — CBC
HEMATOCRIT: 31 % — AB (ref 36.0–46.0)
Hemoglobin: 9.7 g/dL — ABNORMAL LOW (ref 12.0–15.0)
MCH: 29.6 pg (ref 26.0–34.0)
MCHC: 31.3 g/dL (ref 30.0–36.0)
MCV: 94.5 fL (ref 80.0–100.0)
PLATELETS: 240 10*3/uL (ref 150–400)
RBC: 3.28 MIL/uL — ABNORMAL LOW (ref 3.87–5.11)
RDW: 14.6 % (ref 11.5–15.5)
WBC: 8.8 10*3/uL (ref 4.0–10.5)
nRBC: 0 % (ref 0.0–0.2)

## 2019-01-31 LAB — BASIC METABOLIC PANEL
Anion gap: 9 (ref 5–15)
BUN: 20 mg/dL (ref 8–23)
CO2: 30 mmol/L (ref 22–32)
Calcium: 8.3 mg/dL — ABNORMAL LOW (ref 8.9–10.3)
Chloride: 100 mmol/L (ref 98–111)
Creatinine, Ser: 0.84 mg/dL (ref 0.44–1.00)
GFR calc Af Amer: >60 mL/min (ref >60)
GFR calc non Af Amer: >60 mL/min (ref >60)
GLUCOSE: 130 mg/dL — AB (ref 70–99)
Potassium: 3.7 mmol/L (ref 3.5–5.1)
Sodium: 139 mmol/L (ref 135–145)

## 2019-01-31 MED ORDER — FUROSEMIDE 10 MG/ML IJ SOLN
80.0000 mg | Freq: Two times a day (BID) | INTRAMUSCULAR | Status: AC
  Administered 2019-01-31: 80 mg via INTRAVENOUS
  Filled 2019-01-31: qty 8

## 2019-01-31 NOTE — Progress Notes (Signed)
Patient weaned to RA with O2 saturation 96%. Unable to ambulate in hall to check O2 d/t patient having multiple visitors and then refusing to ambulate after lasix administration. States she will walk tonight after lasix has "worn off". Will report to oncoming RN.  Earnest Conroy. Clelia Croft, RN

## 2019-01-31 NOTE — Progress Notes (Signed)
PROGRESS NOTE    Abigail Ryan  NGE:[id number] DOB: Nov 08, 1946 DOA: 01/25/2019 PCP: Kaleen Mask, MD   Chief complaint: Fever   Brief Narrative:   Abigail Ryan is a 73 y.o. female with medical history significant of hypothyroid presents to ED with 1 day hx of mild productive cough, chest pains. Denies sick contacts. Reports sitting watching TV around 5pm on day prior to admit when pt suddenly felt flushed and feverish. Temp note to be as high as 105F. Fevers persisted overnight into day of admit. Initially denied cough, however later reported mild cough productive of thick sputum. Denies diarrhea or neck stiffness.  ED Course: In the ED, pt note to be febrile to 104F improved with tylenol. CXR found to be clear. UA and blood cultures were ordered, pending. Flu was found to be neg. Patient was started on empiric azithromycin and rocephin. Pt also noted to have trop of 0.4 with no ischemic changes on EKG. Cardiology consulted by EDP. Hospitalist consulted for consideration for admission. Of note, patient has since reported feeling much better after tylenol and one dose of toradol.   Assessment & Plan:   Principal Problem:   Sepsis (HCC) Active Problems:   Hypothyroid   Elevated troponin   Atrial fibrillation with RVR (HCC)   Liver abscess   Sepsis; POA Hepatic abscess Patient presents with fever up to 105 at home with right upper quadrant pain.  CT abdomen/pelvis notable for a 4.1 x 4.0 low density lesion peripheral right liver consistent with possible abscess.  Also noted nodular texture to the liver consistent with possible cirrhosis.  Total bilirubin elevated 1.5 with AST of 43 and ALT of 41.  Patient was febrile to 104.0 in the ED with a white blood cell count of 18.1.  Lactic acid 1.3. --MRI abdomen w/ right hepatic lobe 5.0 x 4.8 cm lesion consistent with a hepatic abscess --s/p IR drainage/aspiration on 2/19; cultures without growth x 3 days --WBC trending down  18.1-->14.4-->12.8-->11.4-->8.8 --Blood cultures x2 01/25/2019: No growth x5 days --T-max 98.9 past 24 hours --Continue antibiotics with Unasyn 3 g IV q6h; await further culture identification and susceptibilities --Discussed with ID, Dr. Orvan Falconer; recommend continue IV antibiotics until afebrile and likely change to Levaquin/Metronidazole (allergy to augmentin-->severe nausea/vomiting) to complete a 3-4 week course and will need follow-up CT in 3-4 weeks following discharge to ensure resolution.  Paroxysmal atrial fibrillation with RVR Patient was noted to have elevated heart rate on admission with EKG notable for A. fib with RVR.  No previous history.  Likely exacerbated by acute infectious process/pain. --Cardiology following, appreciate assistance --TTE 2/18: EF 55-60%, no evidence LV wall motion abnormalities --CHA2DS2/VASC = 2 (age and gender) --TSH 0.540; wnL --Started on a Cardizem drip; titrated off on 01/27/2019 --Metoprolol increased to 50 mg p.o. twice daily on 2/20 --started on Xarelto for anticoagulation --Continue to monitor on telemetry  Elevated troponin Etiology likely demand ischemia in the setting of sepsis and A. fib with RVR as above. --Troponin flat/trending down; 0.72-->0.49-->0.47-->0.35 --Continue antibiotics for likely liver abscess and rate control as above --Continue to monitor on telemetry  Hypothyroidism TSH within normal range, 0.540.   --Continue Synthroid 150 mcg p.o. daily.  Chronic dyspnea Volume Overload She reports dyspnea, coughing, and wheezing for many years.  Reports no improvement with inhalers in the past and improves with Tessalon Perles.  No history of COPD/asthma. Continues on supplemental oxygen, 3L [place]; de-sat to 83% yesterday with PT. --net positive 2.9L since admission --s/p  IV lasix 80mg  by cardiology yesterday with net negative 1.3L fluid balance past 24h --will continue diuresis with IV lasix 60mg  BID  --daily weights; strict  I/O's --Will likely pulmonary outpatient follow-up with PFTs and further work-up following discharge  Weakness/deconditioning --PT following   DVT prophylaxis: Lovenox Code Status: Full code Family Communication: None Disposition Plan: Continue inpatient level of care   Consultants:   Cardiology, Dr. Tenny Craw  Interventional radiology, Dr. Lowella Dandy  Procedures:   TTE 01/26/2019: 1. The left ventricle has normal systolic function, with an ejection fraction of 55-60%. The cavity size was normal. Mild asymmetric septal hypertrophy. Left ventricular diastolic parameters were normal No evidence of left ventricular regional wall  motion abnormalities.  2. The right ventricle has normal systolic function. The cavity was mildly enlarged. There is no increase in right ventricular wall thickness. Right ventricular systolic pressure could not be assessed.  3. The mitral valve is normal in structure. There is mild mitral annular calcification present.  4. The tricuspid valve is normal in structure.  5. The aortic valve is tricuspid.  6. The inferior vena cava was dilated in size with >50% respiratory variability.  7. No evidence of left ventricular regional wall motion abnormalities.  IR liver abscess aspiration on 01/27/2019  Antimicrobials:   Metronidazole 2/17 - 2/19  Unasyn 2/17>>   Subjective: Patient resting comfortably in bed.  Feeling much better and in good spirits today.  Able to wash her hair yesterday which makes her feel much better.  Continues on supplemental oxygen 3 L/min.  Shortness of breath improved.  Abdominal pain relatively resolved.  Afebrile past 24 hours.  No other specific complaints this morning. Denies headache, no visual changes, no nausea/vomiting/diarrhea, no chest pain, no palpitations, no chills/night sweats, no congestion, no issues with bowel/bladder function, no paresthesias.  No acute concerns overnight per nursing staff.  Objective: Vitals:   01/30/19 2105  01/31/19 0002 01/31/19 0548 01/31/19 1100  BP: 106/64 122/62 122/60 (!) 121/56  Pulse:   60 67  Resp:   16   Temp:   98.7 F (37.1 C)   TempSrc:   Oral   SpO2:   99%   Weight:      Height:        Intake/Output Summary (Last 24 hours) at 01/31/2019 1102 Last data filed at 01/31/2019 1012 Gross per 24 hour  Intake 840 ml  Output 2750 ml  Net -1910 ml   Filed Weights   01/25/19 1316  Weight: 108 kg    Examination:  General exam: Appears calm and comfortable  Respiratory system: Clear to auscultation. Respiratory effort normal.  On 3 L nasal cannula Cardiovascular system: S1 & S2 heard, RRR. No JVD, murmurs, rubs, gallops or clicks. No pedal edema. Gastrointestinal system: Abdomen is nondistended, soft, mild right upper quadrant tenderness. No organomegaly or masses felt. Normal bowel sounds heard. Central nervous system: Alert and oriented. No focal neurological deficits. Extremities: Symmetric 5 x 5 power. Skin: No rashes, lesions or ulcers Psychiatry: Judgement and insight appear normal. Mood & affect appropriate.     Data Reviewed: I have personally reviewed following labs and imaging studies  CBC: Recent Labs  Lab 01/25/19 1431  01/27/19 0610 01/28/19 0602 01/29/19 0550 01/30/19 0515 01/31/19 0528  WBC 19.4*   < > 14.4* 12.8* 11.4* 8.7 8.8  NEUTROABS 16.6*  --   --   --   --   --   --   HGB 13.4   < > 10.6* 10.1*  11.5* 10.2* 9.7*  HCT 41.5   < > 34.2* 33.1* 38.5 33.7* 31.0*  MCV 92.4   < > 93.7 96.2 97.5 94.1 94.5  PLT 184   < > 150 153 196 217 240   < > = values in this interval not displayed.   Basic Metabolic Panel: Recent Labs  Lab 01/27/19 0610 01/28/19 0602 01/29/19 0550 01/30/19 0515 01/31/19 0528  NA 137 137 138 139 139  K 4.1 3.8 3.4* 3.6 3.7  CL 106 105 102 100 100  CO2 GLUCOSE 139* 134* 134* 126* 130*  BUN CREATININE 0.93 0.83 0.92 1.01* 0.84  CALCIUM 8.0* 8.1* 8.0* 8.4* 8.3*  MG  --   --   --  2.0  --      GFR: Estimated Creatinine Clearance: 72.4 mL/min (by C-G formula based on SCr of 0.84 mg/dL). Liver Function Tests: Recent Labs  Lab 01/26/19 0523 01/27/19 0610 01/28/19 0602  AST 43* 62* 32  ALT 41 76* 56*  ALKPHOS 82 87 81  BILITOT 1.5* 1.3* 1.0  PROT 6.4* 5.9* 6.1*  ALBUMIN 3.1* 2.7* 2.8*   No results for input(s): LIPASE, AMYLASE in the last 168 hours. No results for input(s): AMMONIA in the last 168 hours. Coagulation Profile: Recent Labs  Lab 01/27/19 0610  INR 1.45   Cardiac Enzymes: Recent Labs  Lab 01/25/19 1756 01/25/19 2107 01/26/19 0000 01/26/19 0523  TROPONINI 0.72* 0.49* 0.47* 0.35*   BNP (last 3 results) No results for input(s): PROBNP in the last 8760 hours. HbA1C: No results for input(s): HGBA1C in the last 72 hours. CBG: No results for input(s): GLUCAP in the last 168 hours. Lipid Profile: No results for input(s): CHOL, HDL, LDLCALC, TRIG, CHOLHDL, LDLDIRECT in the last 72 hours. Thyroid Function Tests: No results for input(s): TSH, T4TOTAL, FREET4, T3FREE, THYROIDAB in the last 72 hours. Anemia Panel: No results for input(s): VITAMINB12, FOLATE, FERRITIN, TIBC, IRON, RETICCTPCT in the last 72 hours. Sepsis Labs: Recent Labs  Lab 01/25/19 1431 01/26/19 1129 01/26/19 1359  LATICACIDVEN 1.2 1.1 1.3    Recent Results (from the past 240 hour(s))  Respiratory Panel by PCR     Status: None   Collection Time: 01/25/19  2:31 PM  Result Value Ref Range Status   Adenovirus NOT DETECTED NOT DETECTED Final   Coronavirus 229E NOT DETECTED NOT DETECTED Final    Comment: (NOTE) The Coronavirus on the Respiratory Panel, DOES NOT test for the novel  Coronavirus (2019 nCoV)    Coronavirus HKU1 NOT DETECTED NOT DETECTED Final   Coronavirus NL63 NOT DETECTED NOT DETECTED Final   Coronavirus OC43 NOT DETECTED NOT DETECTED Final   Metapneumovirus NOT DETECTED NOT DETECTED Final   Rhinovirus / Enterovirus NOT DETECTED NOT DETECTED Final   Influenza A  NOT DETECTED NOT DETECTED Final   Influenza B NOT DETECTED NOT DETECTED Final   Parainfluenza Virus 1 NOT DETECTED NOT DETECTED Final   Parainfluenza Virus 2 NOT DETECTED NOT DETECTED Final   Parainfluenza Virus 3 NOT DETECTED NOT DETECTED Final   Parainfluenza Virus 4 NOT DETECTED NOT DETECTED Final   Respiratory Syncytial Virus NOT DETECTED NOT DETECTED Final   Bordetella pertussis NOT DETECTED NOT DETECTED Final   Chlamydophila pneumoniae NOT DETECTED NOT DETECTED Final   Mycoplasma pneumoniae NOT DETECTED NOT DETECTED Final    Comment: Performed at [facility], [address], [place], [place] [address]  Blood Culture (routine  x 2)     Status: None   Collection Time: 01/25/19  5:50 PM  Result Value Ref Range Status   Specimen Description BLOOD LEFT FOREARM  Final   Special Requests   Final    BOTTLES DRAWN AEROBIC AND ANAEROBIC Blood Culture adequate volume Performed at [facility], [address], [place], [place] [address]    Culture NO GROWTH 5 DAYS  Final   Report Status 01/30/2019 FINAL  Final  Blood Culture (routine x 2)     Status: None   Collection Time: 01/25/19  5:54 PM  Result Value Ref Range Status   Specimen Description BLOOD RIGHT ANTECUBITAL  Final   Special Requests   Final    BOTTLES DRAWN AEROBIC AND ANAEROBIC Blood Culture adequate volume Performed at [facility], [address] [place], [place] [address]    Culture NO GROWTH 5 DAYS  Final   Report Status 01/30/2019 FINAL  Final  Aerobic/Anaerobic Culture (surgical/deep wound)     Status: None (Preliminary result)   Collection Time: 01/27/19  5:32 PM  Result Value Ref Range Status   Specimen Description   Final    ABSCESS Performed at [facility], [address], [place], [place] [address]    Special Requests   Final    NONE Performed at [facility], [address], [place], [place] [address]    Gram Stain   Final      ABUNDANT WBC PRESENT,BOTH PMN AND MONONUCLEAR NO ORGANISMS SEEN Performed at [facility], [address] [place], [place] [address]    Culture   Final    NO GROWTH 3 DAYS NO ANAEROBES ISOLATED; CULTURE IN PROGRESS FOR 5 DAYS   Report Status PENDING  Incomplete         Radiology Studies: No results found.      Scheduled Meds: . diltiazem  60 mg Oral Q8H  . furosemide  60 mg Intravenous BID  . ipratropium-albuterol  3 mL Nebulization Once  . levothyroxine  150 mcg Oral QHS  . metoprolol tartrate  50 mg Oral BID  . potassium chloride  20 mEq Oral Daily  . rivaroxaban  20 mg Oral Q supper   Continuous Infusions: . ampicillin-sulbactam (UNASYN) IV 3 g (01/31/19 0544)  . diltiazem (CARDIZEM) infusion Stopped (01/27/19 1123)     LOS: 6 days    Time spent: 29 minutes    Alvira Philips [place], DO [facility] Pager [phone]  If 7PM-7AM, please contact night-coverage [email or web address] Password [email or web address] 01/31/2019, 11:02 AM

## 2019-01-31 NOTE — Progress Notes (Signed)
Progress Note  Patient Name: Abigail Ryan Date of Encounter: 01/31/2019  Primary Cardiologist: Dietrich Pates, MD   Subjective   Patient feels stronger today. Still has RUQ tenderness especially with coughing. She is having wheezing and coughing that she says she has been struggling with for years. She says inhalers make it worse but tessalon pearls help. She denies feeling any awareness of being in afib.   Inpatient Medications    Scheduled Meds: . diltiazem  60 mg Oral Q8H  . furosemide  60 mg Intravenous BID  . ipratropium-albuterol  3 mL Nebulization Once  . levothyroxine  150 mcg Oral QHS  . metoprolol tartrate  50 mg Oral BID  . potassium chloride  20 mEq Oral Daily  . rivaroxaban  20 mg Oral Q supper   Continuous Infusions: . ampicillin-sulbactam (UNASYN) IV 3 g (01/31/19 0544)  . diltiazem (CARDIZEM) infusion Stopped (01/27/19 1123)   PRN Meds: acetaminophen **OR** acetaminophen, benzonatate, fentaNYL (SUBLIMAZE) injection, guaiFENesin-dextromethorphan, iohexol, ipratropium-albuterol, traMADol   Vital Signs    Vitals:   01/30/19 2102 01/30/19 2105 01/31/19 0002 01/31/19 0548  BP: (!) 121/51 106/64 122/62 122/60  Pulse: 73   60  Resp: 18   16  Temp: 98.9 F (37.2 C)   98.7 F (37.1 C)  TempSrc: Oral   Oral  SpO2: 96%   99%  Weight:      Height:        Intake/Output Summary (Last 24 hours) at 01/31/2019 1023 Last data filed at 01/31/2019 1012 Gross per 24 hour  Intake 840 ml  Output 2750 ml  Net -1910 ml   + 5. 3 L    + %Risk Evaluation and Mitigation Strategies (REMS)  The medication you are ordering is associated with Risk Evaluation and Mitigation Strategies (REMS) and has Elements to [employer] (ETASU).   Please use the link below to the FDA website to assure all ETASU are addressed for this medication.  [email or web address]-mooney.[email or web address] L    Last 3 Weights 01/25/2019 08/24/2015 08/22/2015  Weight (lbs) 238 lb 229 lb 6  oz 227 lb  Weight (kg) 107.956 kg 104.044 kg 102.967 kg      Telemetry    Patient was in sinus rhythm yesterday then developed A. fib with RVR that lasted from 4:30 PM to 4 AM then was in sinus rhythm in the 60s.  Patient had another episode of A. fib with RVR rates 120s to 140s from 8:40 AM to 11:00 this morning.  Now in sinus rhythm in the 60s-70s.- Personally Reviewed  ECG    No new tracings - Personally Reviewed  Physical Exam   GEN: Obese female, No acute distress.   Neck: Neck full  JVP is increased  Cardiac: RRR, no murmurs, rubs, or gallops.  Respiratory:  Mild wheeze GI: Soft, nontender, non-distended  MS: No edema; No deformity. Neuro:  Nonfocal  Psych: Normal affect   Labs    Chemistry Recent Labs  Lab 01/26/19 0523 01/27/19 0610 01/28/19 0602 01/29/19 0550 01/30/19 0515 01/31/19 0528  NA 133* 137 137 138 139 139  K 3.9 4.1 3.8 3.4* 3.6 3.7  CL 102 106 105 102 100 100  CO2 20* 24 24 24 31 30   GLUCOSE 147* 139* 134* 134* 126* 130*  BUN 21 18 19 22 21 20   CREATININE 1.01* 0.93 0.83 0.92 1.01* 0.84  CALCIUM 8.0* 8.0* 8.1* 8.0* 8.4* 8.3*  PROT 6.4* 5.9* 6.1*  --   --   --  ALBUMIN 3.1* 2.7* 2.8*  --   --   --   AST 43* 62* 32  --   --   --   ALT 41 76* 56*  --   --   --   ALKPHOS 82 87 81  --   --   --   BILITOT 1.5* 1.3* 1.0  --   --   --   GFRNONAA 55* >60 >60 >60 55* >60  GFRAA >60 >60 >60 >60 >60 >60  ANIONGAP 11 7 8 12 8 9      Hematology Recent Labs  Lab 01/29/19 0550 01/30/19 0515 01/31/19 0528  WBC 11.4* 8.7 8.8  RBC 3.95 3.58* 3.28*  HGB 11.5* 10.2* 9.7*  HCT 38.5 33.7* 31.0*  MCV 97.5 94.1 94.5  MCH 29.1 28.5 29.6  MCHC 29.9* 30.3 31.3  RDW 14.3 14.4 14.6  PLT 196 217 240    Cardiac Enzymes Recent Labs  Lab 01/25/19 1756 01/25/19 2107 01/26/19 0000 01/26/19 0523  TROPONINI 0.72* 0.49* 0.47* 0.35*    Recent Labs  Lab 01/25/19 1444  TROPIPOC 0.40*     BNP Recent Labs  Lab 01/30/19 1439  BNP 295.3*     DDimer No  results for input(s): DDIMER in the last 168 hours.   Radiology    No results found.  Cardiac Studies   Echocardiogram 01/26/2019 IMPRESSIONS 1. The left ventricle has normal systolic function, with an ejection fraction of 55-60%. The cavity size was normal. Mild asymmetric septal hypertrophy. Left ventricular diastolic parameters were normal No evidence of left ventricular regional wall  motion abnormalities. 2. The right ventricle has normal systolic function. The cavity was mildly enlarged. There is no increase in right ventricular wall thickness. Right ventricular systolic pressure could not be assessed. 3. The mitral valve is normal in structure. There is mild mitral annular calcification present. 4. The tricuspid valve is normal in structure. 5. The aortic valve is tricuspid. 6. The inferior vena cava was dilated in size with >50% respiratory variability. 7. No evidence of left ventricular regional wall motion abnormalities.  SUMMARY Normal LV EF. No significant wall motion abnormalities noted, though technical limitations reduce sensitivity for mild abnormalities. No significant valve disease. FINDINGS Left Ventricle: The left ventricle has normal systolic function, with an ejection fraction of 55-60%. The cavity size was normal. Mild asymmetric septal hypertrophy. Left ventricular diastolic parameters were normal No evidence of left ventricular  regional wall motion abnormalities.. Right Ventricle: The right ventricle has normal systolic function. The cavity was mildly enlarged. There is no increase in right ventricular wall thickness. Right ventricular systolic pressure could not be assessed.  Patient Profile     73 y.o. female with a history of hypothyroid and nephrolithiasis. No prior cardiac history. Admitted 01/25/19 with fever up to 105 and cough. 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  We were consulted for elevated troponin and atrial fibrillation.  Assessment & Plan      Atrial fibrillation with RVR -No prior known history of A. Fib. Pt had afib with RVR on admit when T 105   COnverted on own   Rx dilt and metoprolol    On Thus (2/19-2/20) had recurrence when otherwise imporved    Recomm, when CHADSVASc of 3that she be on Xarelto.    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  -In setting of sepsis with fever up to 105 and tachycardia. No substernal chest pain or prior known cardiac disease,-Echocardiogram showed normal LV systolic function with no wallregionalmotion abnormalities, Bilateral atria were normal in size. -Troponin elevation most likely due to demand ischemia in setting of sepsis.   Sepsis -Finding of hepatic mass on CT and MRI was most consistent with hepatic abscess.  Leukocytosis and fever.  Underwent ultrasound-guided liver lesion aspiration yesterday. Small amount of fluid taken   Labs pending. -Management per primary team.  On IV antibiotics. For questions or updates, please contact [facility] Please consult [email or web address] for contact info under        Signed, Dietrich PatesPaula Bessye Stith, MD  01/31/2019, 10:23 AM     Pt seen and examined  I have amended note above by  Abigail Ryan to reflect my findings Pt had 2 periods of atrial fibrillation over the past 24 hours   Rate 120s to 150s   I cannot blame afib on acute illness as she is clinically improving   Has low grade fever only and WBC minimally increased  She does no sense palpitations    Indeed she may have more than she knows (prior to admit)  ON exam Neck;   JVP is increased   Lungs  Mild wheezing  Cardiac RRR   No S3 Ext with triv edema  RECOMM:  Will need long term anticoagulation  (CHADSVASC is 3) Would try using b blocker and diltiazem for now If recurs at fast rate may end up on  antiarrhythmic  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 Will need to have long term f/u in cardiology for this    Would give IV lasix x 1 again with KCL   Volume is still up   Some wheezing  (prob reflects some of diastolic dysfunction in setting of afib with RVR)  Dietrich PatesPaula Caulin Begley MD

## 2019-02-01 ENCOUNTER — Telehealth: Payer: Self-pay | Admitting: Internal Medicine

## 2019-02-01 DIAGNOSIS — I5033 Acute on chronic diastolic (congestive) heart failure: Secondary | ICD-10-CM

## 2019-02-01 LAB — CBC
HCT: 35.5 % — ABNORMAL LOW (ref 36.0–46.0)
Hemoglobin: 11 g/dL — ABNORMAL LOW (ref 12.0–15.0)
MCH: 29.4 pg (ref 26.0–34.0)
MCHC: 31 g/dL (ref 30.0–36.0)
MCV: 94.9 fL (ref 80.0–100.0)
Platelets: 301 10*3/uL (ref 150–400)
RBC: 3.74 MIL/uL — ABNORMAL LOW (ref 3.87–5.11)
RDW: 14.3 % (ref 11.5–15.5)
WBC: 9 10*3/uL (ref 4.0–10.5)
nRBC: 0 % (ref 0.0–0.2)

## 2019-02-01 LAB — BASIC METABOLIC PANEL
Anion gap: 10 (ref 5–15)
BUN: 22 mg/dL (ref 8–23)
CHLORIDE: 97 mmol/L — AB (ref 98–111)
CO2: 30 mmol/L (ref 22–32)
Calcium: 8.3 mg/dL — ABNORMAL LOW (ref 8.9–10.3)
Creatinine, Ser: 0.85 mg/dL (ref 0.44–1.00)
GFR calc Af Amer: >60 mL/min (ref >60)
GFR calc non Af Amer: >60 mL/min (ref >60)
GLUCOSE: 111 mg/dL — AB (ref 70–99)
Potassium: 3.7 mmol/L (ref 3.5–5.1)
Sodium: 137 mmol/L (ref 135–145)

## 2019-02-01 LAB — AEROBIC/ANAEROBIC CULTURE W GRAM STAIN (SURGICAL/DEEP WOUND)

## 2019-02-01 LAB — AEROBIC/ANAEROBIC CULTURE (SURGICAL/DEEP WOUND): CULTURE: NO GROWTH

## 2019-02-01 MED ORDER — DILTIAZEM HCL ER COATED BEADS 180 MG PO CP24: 180.0000 mg | ORAL_CAPSULE | Freq: Every day | ORAL | Status: DC

## 2019-02-01 MED ORDER — LEVOFLOXACIN 750 MG PO TABS: 750.0000 mg | ORAL_TABLET | Freq: Every day | ORAL | 0 refills | Status: AC

## 2019-02-01 MED ORDER — DILTIAZEM HCL ER COATED BEADS 240 MG PO TB24: 240.0000 mg | ORAL_TABLET | Freq: Every day | ORAL | 0 refills | Status: DC

## 2019-02-01 MED ORDER — RIVAROXABAN 20 MG PO TABS: 20.0000 mg | ORAL_TABLET | Freq: Every day | ORAL | 0 refills | Status: DC

## 2019-02-01 MED ORDER — METRONIDAZOLE 500 MG PO TABS: 500.0000 mg | ORAL_TABLET | Freq: Three times a day (TID) | ORAL | 0 refills | Status: AC

## 2019-02-01 MED ORDER — METOPROLOL TARTRATE 50 MG PO TABS: 50.0000 mg | ORAL_TABLET | Freq: Two times a day (BID) | ORAL | 0 refills | Status: DC

## 2019-02-01 MED ORDER — DILTIAZEM HCL ER COATED BEADS 180 MG PO CP24: 180.0000 mg | ORAL_CAPSULE | Freq: Every day | ORAL | 0 refills | Status: DC

## 2019-02-01 NOTE — Progress Notes (Signed)
Progress Note  Patient Name: Abigail Ryan Date of Encounter: 02/01/2019  Primary Cardiologist: Dietrich Pates, MD   Subjective   No chest pain or SOB Pt without drug coverage with medicare, will need to see cost of xarelto Inpatient Medications    Scheduled Meds: . diltiazem  180 mg Oral Daily  . ipratropium-albuterol  3 mL Nebulization Once  . levothyroxine  150 mcg Oral QHS  . metoprolol tartrate  50 mg Oral BID  . potassium chloride  20 mEq Oral Daily  . rivaroxaban  20 mg Oral Q supper   Continuous Infusions: . ampicillin-sulbactam (UNASYN) IV 3 g (02/01/19 0537)   PRN Meds: acetaminophen **OR** acetaminophen, benzonatate, fentaNYL (SUBLIMAZE) injection, guaiFENesin-dextromethorphan, iohexol, ipratropium-albuterol, traMADol   Vital Signs    Vitals:   01/31/19 1255 01/31/19 1806 01/31/19 2135 02/01/19 0452  BP: 108/65 123/73 137/70 120/65  Pulse: 60 66 68 60  Resp: 16  18 17   Temp: 98.9 F (37.2 C) 99.4 F (37.4 C) 98.3 F (36.8 C) 98.5 F (36.9 C)  TempSrc: Oral Oral Oral Oral  SpO2: 94% 90% 92% 94%  Weight:      Height:        Intake/Output Summary (Last 24 hours) at 02/01/2019 1005 Last data filed at 02/01/2019 0600 Gross per 24 hour  Intake 240 ml  Output 3100 ml  Net -2860 ml   Last 3 Weights 01/25/2019 08/24/2015 08/22/2015  Weight (lbs) 238 lb 229 lb 6 oz 227 lb  Weight (kg) 107.956 kg 104.044 kg 102.967 kg      Telemetry    SR for 48 hours  - Personally Reviewed  ECG    No new - Personally Reviewed  Physical Exam   GEN: No acute distress.   Neck: No JVD Cardiac: RRR, no murmurs, rubs, or gallops.  Respiratory: Clear to auscultation bilaterally. GI: Soft, nontender, non-distended  MS: No edema; No deformity. Neuro:  Nonfocal  Psych: Normal affect   Labs    Chemistry Recent Labs  Lab 01/26/19 0523 01/27/19 0610 01/28/19 0602  01/30/19 0515 01/31/19 0528 02/01/19 0617  NA 133* 137 137   < > 139 139 137  K 3.9 4.1 3.8   < > 3.6  3.7 3.7  CL 102 106 105   < > 100 100 97*  CO2 20* 24 24   < > 31 30 30   GLUCOSE 147* 139* 134*   < > 126* 130* 111*  BUN 21 18 19    < > 21 20 22   CREATININE 1.01* 0.93 0.83   < > 1.01* 0.84 0.85  CALCIUM 8.0* 8.0* 8.1*   < > 8.4* 8.3* 8.3*  PROT 6.4* 5.9* 6.1*  --   --   --   --   ALBUMIN 3.1* 2.7* 2.8*  --   --   --   --   AST 43* 62* 32  --   --   --   --   ALT 41 76* 56*  --   --   --   --   ALKPHOS 82 87 81  --   --   --   --   BILITOT 1.5* 1.3* 1.0  --   --   --   --   GFRNONAA 55* >60 >60   < > 55* >60 >60  GFRAA >60 >60 >60   < > >60 >60 >60  ANIONGAP 11 7 8    < > 8 9 10    < > =  values in this interval not displayed.     Hematology Recent Labs  Lab 01/30/19 0515 01/31/19 0528 02/01/19 0617  WBC 8.7 8.8 9.0  RBC 3.58* 3.28* 3.74*  HGB 10.2* 9.7* 11.0*  HCT 33.7* 31.0* 35.5*  MCV 94.1 94.5 94.9  MCH 28.5 29.6 29.4  MCHC 30.3 31.3 31.0  RDW 14.4 14.6 14.3  PLT 217 240 301    Cardiac Enzymes Recent Labs  Lab 01/25/19 1756 01/25/19 2107 01/26/19 0000 01/26/19 0523  TROPONINI 0.72* 0.49* 0.47* 0.35*    Recent Labs  Lab 01/25/19 1444  TROPIPOC 0.40*     BNP Recent Labs  Lab 01/30/19 1439  BNP 295.3*     DDimer No results for input(s): DDIMER in the last 168 hours.   Radiology    No results found.  Cardiac Studies   Echocardiogram 01/26/2019 IMPRESSIONS 1. The left ventricle has normal systolic function, with an ejection fraction of 55-60%. The cavity size was normal. Mild asymmetric septal hypertrophy. Left ventricular diastolic parameters were normal No evidence of left ventricular regional wall  motion abnormalities. 2. The right ventricle has normal systolic function. The cavity was mildly enlarged. There is no increase in right ventricular wall thickness. Right ventricular systolic pressure could not be assessed. 3. The mitral valve is normal in structure. There is mild mitral annular calcification present. 4. The tricuspid valve is  normal in structure. 5. The aortic valve is tricuspid. 6. The inferior vena cava was dilated in size with >50% respiratory variability. 7. No evidence of left ventricular regional wall motion abnormalities.  SUMMARY Normal LV EF. No significant wall motion abnormalities noted, though technical limitations reduce sensitivity for mild abnormalities. No significant valve disease. FINDINGS Left Ventricle: The left ventricle has normal systolic function, with an ejection fraction of 55-60%. The cavity size was normal. Mild asymmetric septal hypertrophy. Left ventricular diastolic parameters were normal No evidence of left ventricular  regional wall motion abnormalities.. Right Ventricle: The right ventricle has normal systolic function. The cavity was mildly enlarged. There is no increase in right ventricular wall thickness. Right ventricular systolic pressure could not be assessed.   Patient Profile     73 y.o. female witha history of hypothyroid and nephrolithiasis. No priorcardiac history. Admitted 01/25/19 with fever up to 105 and cough. 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  We were consulted for elevated troponin and atrial fibrillation.  Assessment & Plan    A fib with RVR and this is new a fib.   --initially a fib with fever of 105  Converted to SR.  On dilt  60 mg every 8 hours with plan for discharge today will change to CD 180 mg daily can begin with 2 pm dose and then start in AM    and metoprolol 50 BID.   --recurrent a fib 01/27/19  And 2 episodes over the weekend.  Pt is not aware of a fib.  Dr. Tenny Craw believes we can no longer blame acute illness.   --CHA2DS2VASc of 3 Xarelto recommended will have care manager check with cost.  May need coumadin or another NOAC but she needs the anticoagulation.    Acute on chronic diastolic HF. IV lasix given yesterday and day before.  --overall neg 287 since admit no weights. At one point she was +7012 ml.   (last 24 hours she was -3060)     Elevated Troponin with pk of 0.72 and trended down to 0.35.  With normal LV function on Echo and no RWMA.  Felt  to be due to demand ischemia in acute sepsis.    Hypothyroid per IM       For questions or updates, please contact [facility] Please consult [email or web address] for contact info under        Signed, Nada Boozer, NP  02/01/2019, 10:05 AM

## 2019-02-01 NOTE — Telephone Encounter (Signed)
TOC appt per Nada Boozer with Tereso Newcomer on 02/12/19 @ 8:45

## 2019-02-01 NOTE — Care Management Note (Signed)
Case Management Note  Patient Details  Name: HLEE DAWES MRN: [id number] Date of Birth: January 17, 1946  Subjective/Objective:Ordered for [facility]. Contacted several HHC agencies-[facility]-unable to accept. [facility] rep Cory-able to accept with pcp that patient will follow-Dr. Aida Puffer. HHRN/PT ordered. Patient has no script coverage-they will use the xarelto 30 day free discount coupon, & the $10 co pay there after. No further CM needs.                     Action/Plan:dc home w/HHC.   Expected Discharge Date:  02/01/19               Expected Discharge Plan:  Home w Home Health Services  In-House Referral:     Discharge planning Services  CM Consult  Post Acute Care Choice:    Choice offered to:  Patient  DME Arranged:    DME Agency:     HH Arranged:  RN, PT HH Agency:  [facility]  Status of Service:  Completed, signed off  If discussed at Long Length of Stay Meetings, dates discussed:    Additional Comments:  Lanier Clam, RN 02/01/2019, 11:08 AM

## 2019-02-01 NOTE — Discharge Summary (Signed)
Physician Discharge Summary  Abigail Ryan Ryan:[id number] DOB: Apr 22, 1946 DOA: 01/25/2019  PCP: Kaleen Mask, MD  Admit date: 01/25/2019 Discharge date: 02/01/2019  Admitted From: Home Disposition: Home  Recommendations for Outpatient Follow-up:  1. Follow up with PCP in 1 week 2. Please obtain BMP/CBC in one week 3. Will need repeat CT versus MRI with contrast of abdomen to ensure resolution of her hepatic abscess following antibiotic therapy completion 4. Will need follow-up with cardiology for new onset A. fib with RVR, newly started on Cardizem, metoprolol, and Xarelto 5. Recommends referral to pulmonology for work-up regarding chronic dyspnea with formal PFTs.  Home Health: Yes Equipment/Devices: None  Discharge Condition: Stable CODE STATUS: Full code Diet recommendation: Heart healthy  History of present illness:  Abigail B Jonesis a 73 y.o.femalewith medical history significant ofhypothyroid presents to ED with 1 day hx ofmildproductive cough, chest pains. Denies sick contacts.Reports sitting watching TV around 5pm on day prior to admit when pt suddenly felt flushed and feverish. Temp note to be as high as 105F. Fevers persisted overnight into day of admit. Initially denied cough, however later reported mild cough productive of thick sputum. Denies diarrhea or neck stiffness.  ED Course:In the ED, pt note to be febrile to 104F improved with tylenol. CXR found to be clear. UA and blood cultures were ordered, pending. Flu was found to be neg. Patient was started on empiric azithromycin and rocephin. Pt also noted to have trop of 0.4 with no ischemic changes on EKG. Cardiology consulted by EDP. Hospitalist consulted for consideration for admission.Of note, patient has since reported feeling much better after tylenol and one dose of toradol.  Hospital course:  Sepsis; POA Hepatic abscess Patient presents with fever up to 105 at home with right upper quadrant pain.   CT abdomen/pelvis notable for a 4.1 x 4.0 low density lesion peripheral right liver consistent with possible abscess.  Also noted nodular texture to the liver consistent with possible cirrhosis.  Total bilirubin elevated 1.5 with AST of 43 and ALT of 41.  Patient was febrile to 104.0 in the ED with a white blood cell count of 18.1.  Lactic acid 1.3. MRI abdomen w/ right hepatic lobe 5.0 x 4.8 cm lesion consistent with a hepatic abscess.  Patient underwent IR aspiration on 01/27/2019 with cultures finalized without growth.  She continued on Unasyn with good response in regards to her leukocytosis with normalization.  She has been afebrile over the past 48 hours and she will continue antibiotics with Levaquin and metronidazole (reported allergy to Augmentin severe nausea/vomiting) and will continue for an additional 21 days for a total course of 4 weeks.  Case was discussed with on-call infectious disease, Dr. Orvan Falconer who agreed with this antibiotic course.  Patient will need follow-up CT versus MRI with contrast following antibiotic therapy to ensure resolution of her hepatic abscess.  Paroxysmal atrial fibrillation with RVR Patient was noted to have elevated heart rate on admission with EKG notable for A. fib with RVR.  No previous history.  Likely exacerbated by acute infectious process/pain.  Cardiology followed during the hospital course. TTE 2/18: EF 55-60%, no evidence LV wall motion abnormalities. CHA2DS2/VASC calculated at 3.  TSH was within normal limits.  Initially requiring Cardizem drip which was titrated off.  Patient was started on metoprolol and Cardizem p.o.  Given her persistent atrial fibrillation during hospitalization, patient was started on chronic anticoagulation with Xarelto by cardiology.  Patient to follow-up with cardiology outpatient following hospitalization.  Elevated  troponin Etiology likely demand ischemia in the setting of sepsis and A. fib with RVR as above. Troponin  flat/trending down; 0.72-->0.49-->0.47-->0.35.  Transthoracic echocardiogram unrevealing.  May need further ischemic work-up outpatient by cardiology.  Hypothyroidism TSH within normal range, 0.540. Continue Synthroid 150 mcg p.o. daily.  Chronic dyspnea Volume Overload She reports dyspnea, coughing, and wheezing for many years.  Reports no improvement with inhalers in the past and improves with Tessalon Perles.  No history of COPD/asthma.  Was noted to be significantly volume overloaded during hospitalization due to fluid resuscitation and IV antibiotics.  Patient was requiring supplemental oxygen.  She was diuresed with furosemide aggressively and able to titrate off of supplemental oxygen.  Patient would likely benefit from pulmonary outpatient referral with formal PFTs following resolution of her acute illness to ensure no chronic lung disease process given her previous history.  Weakness/deconditioning Patient was followed by physical therapy while hospitalized.  Recommended home health PT on discharge.  Discharge Diagnoses:  Principal Problem:   Sepsis (HCC) Active Problems:   Hypothyroid   Elevated troponin   Atrial fibrillation with RVR (HCC)   Liver abscess    Discharge Instructions  Discharge Instructions    (HEART FAILURE PATIENTS) Call MD:  Anytime you have any of the following symptoms: 1) 3 pound weight gain in 24 hours or 5 pounds in 1 week 2) shortness of breath, with or without a dry hacking cough 3) swelling in the hands, feet or stomach 4) if you have to sleep on extra pillows at night in order to breathe.   Complete by:  As directed    Call MD for:  difficulty breathing, headache or visual disturbances   Complete by:  As directed    Call MD for:  persistant dizziness or light-headedness   Complete by:  As directed    Call MD for:  persistant nausea and vomiting   Complete by:  As directed    Call MD for:  severe uncontrolled pain   Complete by:  As directed     Call MD for:  temperature >100.4   Complete by:  As directed    Diet - low sodium heart healthy   Complete by:  As directed    Increase activity slowly   Complete by:  As directed      Allergies as of 02/01/2019      Reactions   Codeine Nausea Only, Other (See Comments)   Extreme headaches also   Morphine And Related Other (See Comments)   Extreme headaches      Medication List    STOP taking these medications   tamsulosin 0.4 MG Caps capsule Commonly known as:  FLOMAX     TAKE these medications   acetaminophen 500 MG tablet Commonly known as:  TYLENOL Take 1,000 mg by mouth every 6 (six) hours as needed for moderate pain or fever.   BC HEADACHE POWDER PO Take 1-2 packets by mouth 2 (two) times daily as needed (pain).   cholecalciferol 1000 units tablet Commonly known as:  VITAMIN D Take 1,500 Units by mouth daily.   diltiazem 180 MG 24 hr capsule Commonly known as:  CARDIZEM CD Take 1 capsule (180 mg total) by mouth daily.   ibuprofen 200 MG tablet Commonly known as:  ADVIL,MOTRIN Take 200 mg by mouth every 6 (six) hours as needed for mild pain.   levofloxacin 750 MG tablet Commonly known as:  LEVAQUIN Take 1 tablet (750 mg total) by mouth daily for 21  days.   levothyroxine 150 MCG tablet Commonly known as:  SYNTHROID, LEVOTHROID Take 150 mcg by mouth at bedtime.   metoprolol tartrate 50 MG tablet Commonly known as:  LOPRESSOR Take 1 tablet (50 mg total) by mouth 2 (two) times daily.   metroNIDAZOLE 500 MG tablet Commonly known as:  FLAGYL Take 1 tablet (500 mg total) by mouth 3 (three) times daily for 21 days.   OMEGA-3 KRILL OIL PO Take 1 capsule by mouth daily.   rivaroxaban 20 MG Tabs tablet Commonly known as:  XARELTO Take 1 tablet (20 mg total) by mouth daily with supper.   traMADol 50 MG tablet Commonly known as:  ULTRAM Take 1 tablet (50 mg total) by mouth every 12 (twelve) hours as needed for severe pain.      Follow-up Information     Kaleen Mask, MD. Schedule an appointment as soon as possible for a visit in 1 week(s).   Specialty:  Family Medicine Contact information: [address] [place] [address] [phone]        Pricilla Riffle, MD Follow up on 02/12/2019.   Specialty:  Cardiology Why:  at 8:45 AM with her PA [facility] information: [address] [place] [address] [phone]        Care, [facility] Follow up.   Specialty:  Home Health Services Why:  [facility] nursing/physical therapy Contact information: [address] [place] [address] [phone]          Allergies  Allergen Reactions  . Codeine Nausea Only and Other (See Comments)    Extreme headaches also  . Morphine And Related Other (See Comments)    Extreme headaches    Consultations:  Cardiology, Dr. Tenny Craw  Interventional radiology, Dr. Lowella Dandy   Procedures/Studies: Dg Chest 2 View  Result Date: 01/25/2019 CLINICAL DATA:  Cough and headache EXAM: CHEST - 2 VIEW COMPARISON:  02/23/2018 FINDINGS: The heart size and mediastinal contours are within normal limits. Both lungs are clear. The visualized skeletal structures are unremarkable. IMPRESSION: No active cardiopulmonary disease. Electronically Signed   By: Deatra Robinson M.D.   On: 01/25/2019 15:15   US Guided Needle Placement  Result Date: 01/27/2019 INDICATION: 73 year old with right upper quadrant pain, fever and elevated white blood cell count. Indeterminate hepatic lesion and concern for intrahepatic abscess. Plan for ultrasound-guided liver lesion aspiration. EXAM: ULTRASOUND-GUIDED LIVER LESION ASPIRATION MEDICATIONS: Moderate sedation medications ANESTHESIA/SEDATION: Fentanyl 50 mcg IV; Versed 1.0 mg IV Moderate Sedation Time:  14 minutes The patient was continuously monitored during the procedure by the interventional radiology nurse under my direct supervision. COMPLICATIONS: None immediate. PROCEDURE:  Informed written consent was obtained from the patient after a thorough discussion of the procedural risks, benefits and alternatives. All questions were addressed. A timeout was performed prior to the initiation of the procedure. Liver was evaluated with ultrasound. Heterogeneous and slightly hyperechoic area in the anterior right hepatic lobe was identified. This area corresponds with the recent abnormalities on CT and MRI. Right upper abdomen was prepped with chlorhexidine and sterile field was created. Skin and soft tissues were anesthetized with 1% lidocaine. 18 gauge trocar needle was directed into this heterogeneous liver lesion. Approximately 4 mL of thick bloody fluid was aspirated. No additional fluid could be removed. Needle removed without complication. Bandage placed over the puncture site. FINDINGS: Heterogeneous and slightly hyperechoic lesion in the right hepatic lobe corresponding with the known abnormality. Approximately 4 mL of thick  bloody fluid was removed. No additional fluid could be aspirated. IMPRESSION: Ultrasound-guided aspiration of the indeterminate right hepatic lesion. 4 mL bloody fluid was removed. Fluid was sent for cytology and culture. Electronically Signed   By: Richarda Overlie M.D.   On: 01/27/2019 17:45   Ct Abdomen Pelvis W Contrast  Result Date: 01/26/2019 CLINICAL DATA:  Abdominal tenderness. Right lateral rib pain with inspiration. Fever. EXAM: CT ABDOMEN AND PELVIS WITH CONTRAST TECHNIQUE: Multidetector CT imaging of the abdomen and pelvis was performed using the standard protocol following bolus administration of intravenous contrast. CONTRAST:  OMNIPAQUE IOHEXOL 300 MG/ML SOLN, 33mL OMNIPAQUE IOHEXOL 300 MG/ML SOLN COMPARISON:  08/16/2015 FINDINGS: Lower chest: Fluid density lesion adjacent to the right inferior pulmonary vein is stable since prior study and comparing back to an exam from 10/14/2014. This measures water attenuation and is compatible with a benign  cystic lesion, potentially right pulmonary venous recess or pericardial cyst. Airspace opacity in the posterior right costophrenic sulcus may be related to atelectasis although infection can not be excluded. Hepatobiliary: 4.1 x 4.0 cm ill-defined low-density lesion is identified in the right liver (26/2). 5 mm hypoattenuating subcapsular lesion in the lateral segment left liver is stable since prior study. Subtle nodularity of liver contour is associated with prominent lateral segment left liver, raising the question of cirrhosis. Gallbladder surgically absent. No intrahepatic or extrahepatic biliary dilation. Pancreas: No focal mass lesion. No dilatation of the main duct. No intraparenchymal cyst. No peripancreatic edema. Spleen: No splenomegaly. No focal mass lesion. Adrenals/Urinary Tract: No adrenal nodule or mass. Kidneys unremarkable. No evidence for hydroureter. The urinary bladder appears normal for the degree of distention. Stomach/Bowel: Tiny hiatal hernia. Stomach otherwise unremarkable. Duodenum is normally positioned as is the ligament of Treitz. No small bowel wall thickening. No small bowel dilatation. The terminal ileum is normal. The appendix is not visualized, but there is no edema or inflammation in the region of the cecum. No gross colonic mass. No colonic wall thickening. Diverticular changes are noted in the left colon without evidence of diverticulitis. Vascular/Lymphatic: There is abdominal aortic atherosclerosis without aneurysm. 10 mm short axis upper normal hepato duodenal ligament lymph node is not substantially changed in the interval. Other upper normal lymph nodes in the hepato duodenal ligament are also stable. Tiny gastrohepatic ligament lymph nodes are unchanged. No retroperitoneal lymphadenopathy 7 mm pericaval lymph node in the upper pelvis is stable. No pelvic sidewall lymphadenopathy. Reproductive: Uterus surgically absent.  There is no adnexal mass. Other: No intraperitoneal  free fluid. Musculoskeletal: No worrisome lytic or sclerotic osseous abnormality. IMPRESSION: 1. 4.1 x 4.0 cm ill-defined low-density lesion in the peripheral right liver. Given the history of high fever and right abdominal/rib pain, abscess would be a distinct consideration. No diverticulitis or other etiology identified to account for potential abscess. Primary hepatic neoplasm and metastatic disease not excluded. Tissue sampling may be warranted. Liver MRI without and with contrast might provide additional useful information period. 2. Subtle nodularity of liver contour associated with asymmetric enlargement lateral segment left liver. Imaging features raise concern for underlying cirrhosis. Stable borderline lymphadenopathy in the hepato duodenal ligament likely reactive. 3. Airspace disease in the posterior right costophrenic sulcus may be atelectatic, but pneumonia not excluded. 4. Left colonic diverticulosis without diverticulitis. 5.  Aortic Atherosclerois (ICD10-170.0) 6. These results will be called to the ordering clinician or representative by the Radiologist Assistant, and communication documented in the PACS or zVision Dashboard. Electronically Signed   By: Jamison Oka.D.  On: 01/26/2019 15:01   Mr Liver W Wo Contrast  Result Date: 01/27/2019 CLINICAL DATA:  Evaluate possible liver abscess. Fever. Abdominal tenderness EXAM: MRI ABDOMEN WITHOUT AND WITH CONTRAST TECHNIQUE: Multiplanar multisequence MR imaging of the abdomen was performed both before and after the administration of intravenous contrast. CONTRAST:  10 mL Gadavist COMPARISON:  CT 01/26/2019, 08/16/2015 FINDINGS: Lower chest:  Lung bases are clear. Hepatobiliary: Within the RIGHT hepatic lobe, there is a rounded lesion which is mixed high signal intensity on T2 weighted imaging measuring 5.0 x 4.8 cm (image 31/3). There is a rim of more diffuse high signal intensity around the lesion suggesting edema within the liver parenchyma. On  precontrast T1 weighted imaging lesion is hypointense. On postcontrast imaging there is thin peripheral enhancing rim and enhancing internal septations. The majority of the internal aspect of the lesion is nonenhancing (image series 1002 and 1003). The peripheral rim is mildly enhancing. On diffusion-weighted imaging, lesion is hyperintense and mildly hypointense on the ADC map. No biliary duct dilatation. Postcholecystectomy. Normal common bile duct. Pancreas: Normal pancreatic parenchymal intensity. No ductal dilatation or inflammation. Spleen: Normal spleen. Adrenals/urinary tract: Adrenal glands and kidneys are normal. Stomach/Bowel: Stomach and limited of the small bowel is unremarkable Vascular/Lymphatic: Abdominal aortic normal caliber. No retroperitoneal periportal lymphadenopathy. Musculoskeletal: No aggressive osseous lesion IMPRESSION: Round septated lesion in the RIGHT hepatic lobe with peripheral rim of enhancing edema is most consistent with a HEPATIC ABSCESS. Less favored differential would include a cystic neoplasm or necrotic lesion such is biliary cystadenoma, mucinous lesion, or necrotic metastasis. Electronically Signed   By: Genevive Bi M.D.   On: 01/27/2019 13:58   Dg Chest Port 1 View  Result Date: 01/26/2019 CLINICAL DATA:  Fever and chest pain EXAM: PORTABLE CHEST 1 VIEW COMPARISON:  01/25/2019 FINDINGS: Normal heart size. Mild aortic tortuosity. Stable appearance of the hila. There is no edema, consolidation, effusion, or pneumothorax. IMPRESSION: No evidence of pneumonia or other acute finding. Electronically Signed   By: Marnee Spring M.D.   On: 01/26/2019 05:54   TTE 01/26/2019: 1. The left ventricle has normal systolic function, with an ejection fraction of 55-60%. The cavity size was normal. Mild asymmetric septal hypertrophy. Left ventricular diastolic parameters were normal No evidence of left ventricular regional wall  motion abnormalities. 2. The right ventricle has  normal systolic function. The cavity was mildly enlarged. There is no increase in right ventricular wall thickness. Right ventricular systolic pressure could not be assessed. 3. The mitral valve is normal in structure. There is mild mitral annular calcification present. 4. The tricuspid valve is normal in structure. 5. The aortic valve is tricuspid. 6. The inferior vena cava was dilated in size with >50% respiratory variability. 7. No evidence of left ventricular regional wall motion abnormalities.  IR liver abscess aspiration on 01/27/2019  Subjective: Patient resting comfortably in bed.  Daughter present.  States ready for discharge home.  Has been afebrile over the past 48 hours with normalization of her leukocytosis.  Heart rate controlled.  No other complaints at this time.  Denies chest pain, no shortness of breath, no palpitations, no abdominal pain.  No acute events overnight per nursing staff.   Discharge Exam: Vitals:   01/31/19 2135 02/01/19 0452  BP: 137/70 120/65  Pulse: 68 60  Resp: 18 17  Temp: 98.3 F (36.8 C) 98.5 F (36.9 C)  SpO2: 92% 94%   Vitals:   01/31/19 1255 01/31/19 1806 01/31/19 2135 02/01/19 0452  BP: 108/65 123/73  137/70 120/65  Pulse: 60 66 68 60  Resp: 16  18 17   Temp: 98.9 F (37.2 C) 99.4 F (37.4 C) 98.3 F (36.8 C) 98.5 F (36.9 C)  TempSrc: Oral Oral Oral Oral  SpO2: 94% 90% 92% 94%  Weight:      Height:        General: Pt is alert, awake, not in acute distress Cardiovascular: IRR, normal rate, S1/S2 +, no rubs, no gallops Respiratory: CTA bilaterally, no wheezing, no rhonchi Abdominal: Soft, NT, ND, bowel sounds + Extremities: no edema, no cyanosis    The results of significant diagnostics from this hospitalization (including imaging, microbiology, ancillary and laboratory) are listed below for reference.     Microbiology: Recent Results (from the past 240 hour(s))  Respiratory Panel by PCR     Status: None   Collection  Time: 01/25/19  2:31 PM  Result Value Ref Range Status   Adenovirus NOT DETECTED NOT DETECTED Final   Coronavirus 229E NOT DETECTED NOT DETECTED Final    Comment: (NOTE) The Coronavirus on the Respiratory Panel, DOES NOT test for the novel  Coronavirus (2019 nCoV)    Coronavirus HKU1 NOT DETECTED NOT DETECTED Final   Coronavirus NL63 NOT DETECTED NOT DETECTED Final   Coronavirus OC43 NOT DETECTED NOT DETECTED Final   Metapneumovirus NOT DETECTED NOT DETECTED Final   Rhinovirus / Enterovirus NOT DETECTED NOT DETECTED Final   Influenza A NOT DETECTED NOT DETECTED Final   Influenza B NOT DETECTED NOT DETECTED Final   Parainfluenza Virus 1 NOT DETECTED NOT DETECTED Final   Parainfluenza Virus 2 NOT DETECTED NOT DETECTED Final   Parainfluenza Virus 3 NOT DETECTED NOT DETECTED Final   Parainfluenza Virus 4 NOT DETECTED NOT DETECTED Final   Respiratory Syncytial Virus NOT DETECTED NOT DETECTED Final   Bordetella pertussis NOT DETECTED NOT DETECTED Final   Chlamydophila pneumoniae NOT DETECTED NOT DETECTED Final   Mycoplasma pneumoniae NOT DETECTED NOT DETECTED Final    Comment: Performed at [facility], [address], [place], [place] [address]  Blood Culture (routine x 2)     Status: None   Collection Time: 01/25/19  5:50 PM  Result Value Ref Range Status   Specimen Description BLOOD LEFT FOREARM  Final   Special Requests   Final    BOTTLES DRAWN AEROBIC AND ANAEROBIC Blood Culture adequate volume Performed at [facility], [address], [place], [place] [address]    Culture NO GROWTH 5 DAYS  Final   Report Status 01/30/2019 FINAL  Final  Blood Culture (routine x 2)     Status: None   Collection Time: 01/25/19  5:54 PM  Result Value Ref Range Status   Specimen Description BLOOD RIGHT ANTECUBITAL  Final   Special Requests   Final    BOTTLES DRAWN AEROBIC AND ANAEROBIC Blood Culture adequate volume Performed at [facility], [address] [place], [place] [address]    Culture NO GROWTH 5 DAYS  Final   Report Status 01/30/2019 FINAL  Final  Aerobic/Anaerobic Culture (surgical/deep wound)     Status: None   Collection Time: 01/27/19  5:32 PM  Result Value Ref Range Status   Specimen Description   Final    ABSCESS Performed at [facility], [address] [place], [place] [address]    Special Requests   Final    NONE Performed at [facility], [address], [place], [place] [address]  Gram Stain   Final    ABUNDANT WBC PRESENT,BOTH PMN AND MONONUCLEAR NO ORGANISMS SEEN    Culture   Final    No growth aerobically or anaerobically. Performed at [facility], [address], [place], [place] [address]    Report Status 02/01/2019 FINAL  Final     Labs: BNP (last 3 results) Recent Labs    01/30/19 1439  BNP 295.3*   Basic Metabolic Panel: Recent Labs  Lab 01/28/19 0602 01/29/19 0550 01/30/19 0515 01/31/19 0528 02/01/19 0617  NA 137 138 139 139 137  K 3.8 3.4* 3.6 3.7 3.7  CL 105 102 100 100 97*  CO2 24 24 31 30 30   GLUCOSE 134* 134* 126* 130* 111*  BUN 19 22 21 20 22   CREATININE 0.83 0.92 1.01* 0.84 0.85  CALCIUM 8.1* 8.0* 8.4* 8.3* 8.3*  MG  --   --  2.0  --   --    Liver Function Tests: Recent Labs  Lab 01/26/19 0523 01/27/19 0610 01/28/19 0602  AST 43* 62* 32  ALT 41 76* 56*  ALKPHOS 82 87 81  BILITOT 1.5* 1.3* 1.0  PROT 6.4* 5.9* 6.1*  ALBUMIN 3.1* 2.7* 2.8*   No results for input(s): LIPASE, AMYLASE in the last 168 hours. No results for input(s): AMMONIA in the last 168 hours. CBC: Recent Labs  Lab 01/25/19 1431  01/28/19 0602 01/29/19 0550 01/30/19 0515 01/31/19 0528 02/01/19 0617  WBC 19.4*   < > 12.8* 11.4* 8.7 8.8 9.0  NEUTROABS 16.6*  --   --   --   --   --   --   HGB 13.4   < > 10.1* 11.5* 10.2* 9.7* 11.0*  HCT 41.5   < > 33.1* 38.5 33.7* 31.0* 35.5*  MCV 92.4   < > 96.2 97.5 94.1 94.5 94.9  PLT 184   < > 153  196 217 240 301   < > = values in this interval not displayed.   Cardiac Enzymes: Recent Labs  Lab 01/25/19 1756 01/25/19 2107 01/26/19 0000 01/26/19 0523  TROPONINI 0.72* 0.49* 0.47* 0.35*   BNP: Invalid input(s): POCBNP CBG: No results for input(s): GLUCAP in the last 168 hours. D-Dimer No results for input(s): DDIMER in the last 72 hours. Hgb A1c No results for input(s): HGBA1C in the last 72 hours. Lipid Profile No results for input(s): CHOL, HDL, LDLCALC, TRIG, CHOLHDL, LDLDIRECT in the last 72 hours. Thyroid function studies No results for input(s): TSH, T4TOTAL, T3FREE, THYROIDAB in the last 72 hours.  Invalid input(s): FREET3 Anemia work up No results for input(s): VITAMINB12, FOLATE, FERRITIN, TIBC, IRON, RETICCTPCT in the last 72 hours. Urinalysis    Component Value Date/Time   COLORURINE YELLOW 01/25/2019 1751   APPEARANCEUR CLEAR 01/25/2019 1751   LABSPEC 1.017 01/25/2019 1751   PHURINE 7.0 01/25/2019 1751   GLUCOSEU NEGATIVE 01/25/2019 1751   HGBUR NEGATIVE 01/25/2019 1751   BILIRUBINUR NEGATIVE 01/25/2019 1751   KETONESUR 20 (A) 01/25/2019 1751   PROTEINUR 100 (A) 01/25/2019 1751   UROBILINOGEN 0.2 08/15/2015 2223   NITRITE NEGATIVE 01/25/2019 1751   LEUKOCYTESUR NEGATIVE 01/25/2019 1751   Sepsis Labs Invalid input(s): PROCALCITONIN,  WBC,  LACTICIDVEN Microbiology Recent Results (from the past 240 hour(s))  Respiratory Panel by PCR     Status: None   Collection Time: 01/25/19  2:31 PM  Result Value Ref Range Status   Adenovirus NOT DETECTED NOT DETECTED Final   Coronavirus 229E NOT DETECTED NOT DETECTED  Final    Comment: (NOTE) The Coronavirus on the Respiratory Panel, DOES NOT test for the novel  Coronavirus (2019 nCoV)    Coronavirus HKU1 NOT DETECTED NOT DETECTED Final   Coronavirus NL63 NOT DETECTED NOT DETECTED Final   Coronavirus OC43 NOT DETECTED NOT DETECTED Final   Metapneumovirus NOT DETECTED NOT DETECTED Final   Rhinovirus /  Enterovirus NOT DETECTED NOT DETECTED Final   Influenza A NOT DETECTED NOT DETECTED Final   Influenza B NOT DETECTED NOT DETECTED Final   Parainfluenza Virus 1 NOT DETECTED NOT DETECTED Final   Parainfluenza Virus 2 NOT DETECTED NOT DETECTED Final   Parainfluenza Virus 3 NOT DETECTED NOT DETECTED Final   Parainfluenza Virus 4 NOT DETECTED NOT DETECTED Final   Respiratory Syncytial Virus NOT DETECTED NOT DETECTED Final   Bordetella pertussis NOT DETECTED NOT DETECTED Final   Chlamydophila pneumoniae NOT DETECTED NOT DETECTED Final   Mycoplasma pneumoniae NOT DETECTED NOT DETECTED Final    Comment: Performed at [facility], [address] [place], [place] [address]  Blood Culture (routine x 2)     Status: None   Collection Time: 01/25/19  5:50 PM  Result Value Ref Range Status   Specimen Description BLOOD LEFT FOREARM  Final   Special Requests   Final    BOTTLES DRAWN AEROBIC AND ANAEROBIC Blood Culture adequate volume Performed at [facility], [address] [place], [place] [address]    Culture NO GROWTH 5 DAYS  Final   Report Status 01/30/2019 FINAL  Final  Blood Culture (routine x 2)     Status: None   Collection Time: 01/25/19  5:54 PM  Result Value Ref Range Status   Specimen Description BLOOD RIGHT ANTECUBITAL  Final   Special Requests   Final    BOTTLES DRAWN AEROBIC AND ANAEROBIC Blood Culture adequate volume Performed at [facility], [address] [place], [place] [address]    Culture NO GROWTH 5 DAYS  Final   Report Status 01/30/2019 FINAL  Final  Aerobic/Anaerobic Culture (surgical/deep wound)     Status: None   Collection Time: 01/27/19  5:32 PM  Result Value Ref Range Status   Specimen Description   Final    ABSCESS Performed at [facility], [address] [place], [place] [address]    Special Requests   Final    NONE Performed at [facility], [address],  [place], [place] [address]    Gram Stain   Final    ABUNDANT WBC PRESENT,BOTH PMN AND MONONUCLEAR NO ORGANISMS SEEN    Culture   Final    No growth aerobically or anaerobically. Performed at [facility], [address] [place], [place] [address]    Report Status 02/01/2019 FINAL  Final     Time coordinating discharge: Over 30 minutes  SIGNED:   Alvira Philips [place], DO  [facility] 02/01/2019, 1:22 PM

## 2019-02-01 NOTE — Telephone Encounter (Signed)
The pt is being discharged from the hospital today. We will call her tomorrow. 

## 2019-02-01 NOTE — Discharge Instructions (Signed)
Liver Abscess  A liver abscess is an infected area inside the liver that contains a collection of pus. The liver is a large organ in the upper right side of the abdomen. It has many important functions, including storing energy, producing fluids that the body needs, and removing harmful substances from the bloodstream. A liver abscess can be dangerous if not treated. What are the causes? This condition may be caused by:  A bacterial infection.  An infection with a parasite called an amoeba (Entamoeba histolytica).  An infection with a fungus called Candida. This is a rare cause. A liver abscess can occur:  When infections spread to the liver from other parts of the abdomen, such as the appendix (appendicitis), the large intestine (diverticulitis), or the gallbladder (cholecystitis).  When infections spread to the liver from other parts of the body through the bloodstream.  After abdominal surgery or a penetrating injury to the abdomen. What increases the risk? You are more likely to develop this condition if you:  Have diabetes.  Have a liver disease (cirrhosis) in which healthy liver tissue is replaced by scar tissue.  Have a weakened disease-fighting system (immune system).  Take a specific type of medicine to reduce stomach acid (proton pump inhibitor).  Are older.  Are female. What are the signs or symptoms? Symptoms of this condition often come on slowly over many days or a few weeks. The most common symptoms are:  Fever.  Pain in the upper right side of the abdomen. Other symptoms include:  A general ill feeling (malaise).  Chills.  Nausea and vomiting.  Loss of appetite.  Diarrhea.  Weight loss. How is this diagnosed? This condition may be diagnosed based on:  Your symptoms, your medical history, and a physical exam.  Blood tests to check for infections.  Imaging tests, such as a CT scan or ultrasound. Doing imaging tests is often the best way to find  an abscess.  Needle aspiration. In this procedure, a long needle is put through the skin and into the liver. A sample of pus is drained out through the needle. The sample is studied under a microscope, and a culture test is done. How is this treated? Treatment for this condition depends on the cause of the infection.  If the abscess was caused by bacteria, treatment usually involves having the pus drained and taking antibiotic medicines. ? At first, you may need to receive antibiotics that are given directly into a vein through an IV. For a severe infection, you may need to be hospitalized. ? The pus may be drained from the abscess through needle aspiration or by making an incision in the abscess.  If the abscess was caused by an amoeba or a fungus, drainage of the abscess is usually not needed. Antimicrobial medicines are normally used to treat those infections. Follow these instructions at home: Medicines  Take over-the-counter and prescription medicines only as told by your health care provider.  If you were prescribed an antibiotic medicine, take it as told by your health care provider. Do not stop taking the antibiotic even if you start to feel better. General instructions  Rest as much as possible and get plenty of sleep.  Avoid activities that take a lot of effort.  Return to your normal activities as told by your health care provider. Ask your health care provider what activities are safe for you.  Do not lift anything that is heavier than 10 lb (4.5 kg), or the limit that you  are told, until your health care provider says that it is safe.  Do not drink alcohol until your health care provider approves.  Keep all follow-up visits as told by your health care provider. This is important. Contact a health care provider if:  Your symptoms get worse, or you develop any of the following symptoms: ? Pain in your abdomen. ? Loss of  appetite. ? Nausea. ? Vomiting. ? Diarrhea. ? Fever. ? Chills or sweats. Get help right away if:  Your abdominal pain suddenly gets worse.  Your skin or your eyes look yellow.  You develop confusion. Summary  A liver abscess is an infected area inside the liver that contains a collection of pus.  Liver abscesses often cause abdominal pain, fever, and other symptoms. A liver abscess can be dangerous if not treated.  If the abscess was caused by bacteria, treatment usually involves having the pus drained and taking antibiotic medicines.  If the abscess was caused by an amoeba or a fungus, drainage of the abscess is usually not needed. Antimicrobial medicines are normally used to treat those infections.  Get help right away if your abdominal pain suddenly gets worse, your skin or your eyes look yellow, or you develop confusion. This information is not intended to replace advice given to you by your health care provider. Make sure you discuss any questions you have with your health care provider. Document Released: 03/12/2011 Document Revised: 11/13/2017 Document Reviewed: 11/13/2017 Elsevier Interactive Patient Education  2019 [employer] Atrial Fibrillation  Atrial fibrillation is a type of heartbeat that is irregular or fast (rapid). If you have this condition, your heart beats without any order. This makes it hard for your heart to pump blood in a normal way. Having this condition gives you more risk for stroke, heart failure, and other heart problems. Atrial fibrillation may start all of a sudden and then stop on its own, or it may become a long-lasting problem. What are the causes? This condition may be caused by heart conditions, such as:  High blood pressure.  Heart failure.  Heart valve disease.  Heart surgery. Other causes include:  Pneumonia.  Obstructive sleep apnea.  Lung cancer.  Thyroid disease.  Drinking too much alcohol. Sometimes the cause is not  known. What increases the risk? You are more likely to develop this condition if:  You smoke.  You are older.  You have diabetes.  You are overweight.  You have a family history of this condition.  You exercise often and hard. What are the signs or symptoms? Common symptoms of this condition include:  A feeling like your heart is beating very fast.  Chest pain.  Feeling short of breath.  Feeling light-headed or weak.  Getting tired easily. Follow these instructions at home: Medicines  Take over-the-counter and prescription medicines only as told by your doctor.  If your doctor gives you a blood-thinning medicine, take it exactly as told. Taking too much of it can cause bleeding. Taking too little of it does not protect you against clots. Clots can cause a stroke. Lifestyle      Do not use any tobacco products. These include cigarettes, chewing tobacco, and e-cigarettes. If you need help quitting, ask your doctor.  Do not drink alcohol.  Do not drink beverages that have caffeine. These include coffee, soda, and tea.  Follow diet instructions as told by your doctor.  Exercise regularly as told by your doctor. General instructions  If you have a condition that  causes breathing to stop for a short period of time (apnea), treat it as told by your doctor.  Keep a healthy weight. Do not use diet pills unless your doctor says they are safe for you. Diet pills may make heart problems worse.  Keep all follow-up visits as told by your doctor. This is important. Contact a doctor if:  You notice a change in the speed, rhythm, or strength of your heartbeat.  You are taking a blood-thinning medicine and you see more bruising.  You get tired more easily when you move or exercise.  You have a sudden change in weight. Get help right away if:   You have pain in your chest or your belly (abdomen).  You have trouble breathing.  You have blood in your vomit, poop, or  pee (urine).  You have any signs of a stroke. "BE FAST" is an easy way to remember the main warning signs: ? B - Balance. Signs are dizziness, sudden trouble walking, or loss of balance. ? E - Eyes. Signs are trouble seeing or a change in how you see. ? F - Face. Signs are sudden weakness or loss of feeling in the face, or the face or eyelid drooping on one side. ? A - Arms. Signs are weakness or loss of feeling in an arm. This happens suddenly and usually on one side of the body. ? S - Speech. Signs are sudden trouble speaking, slurred speech, or trouble understanding what people say. ? T - Time. Time to call emergency services. Write down what time symptoms started.  You have other signs of a stroke, such as: ? A sudden, very bad headache with no known cause. ? Feeling sick to your stomach (nausea). ? Throwing up (vomiting). ? Jerky movements you cannot control (seizure). These symptoms may be an emergency. Do not wait to see if the symptoms will go away. Get medical help right away. Call your local emergency services (911 in the [place] Do not drive yourself to the hospital. Summary  Atrial fibrillation is a type of heartbeat that is irregular or fast (rapid).  You are at higher risk of this condition if you smoke, are older, have diabetes, or are overweight.  Follow your doctor's instructions about medicines, diet, exercise, and follow-up visits.  Get help right away if you think that you have signs of a stroke. This information is not intended to replace advice given to you by your health care provider. Make sure you discuss any questions you have with your health care provider. Document Released: 09/03/2008 Document Revised: 01/16/2018 Document Reviewed: 01/16/2018 Elsevier Interactive Patient Education  2019 [employer]. Information on my medicine - XARELTO (Rivaroxaban)  This medication education was reviewed with me or my healthcare representative as part of my discharge  preparation.  The pharmacist that spoke with me during my hospital stay was:  Mary  Why was Xarelto prescribed for you? Xarelto was prescribed for you to reduce the risk of a blood clot forming that can cause a stroke if you have a medical condition called atrial fibrillation (a type of irregular heartbeat).  What do you need to know about xarelto ? Take your Xarelto ONCE DAILY at the same time every day with your evening meal. If you have difficulty swallowing the tablet whole, you may crush it and mix in applesauce just prior to taking your dose.  Take Xarelto exactly as prescribed by your doctor and DO NOT stop taking Xarelto without talking to the doctor  who prescribed the medication.  Stopping without other stroke prevention medication to take the place of Xarelto may increase your risk of developing a clot that causes a stroke.  Refill your prescription before you run out.  After discharge, you should have regular check-up appointments with your healthcare provider that is prescribing your Xarelto.  In the future your dose may need to be changed if your kidney function or weight changes by a significant amount.  What do you do if you miss a dose? If you are taking Xarelto ONCE DAILY and you miss a dose, take it as soon as you remember on the same day then continue your regularly scheduled once daily regimen the next day. Do not take two doses of Xarelto at the same time or on the same day.   Important Safety Information A possible side effect of Xarelto is bleeding. You should call your healthcare provider right away if you experience any of the following: ? Bleeding from an injury or your nose that does not stop. ? Unusual colored urine (red or dark brown) or unusual colored stools (red or black). ? Unusual bruising for unknown reasons. ? A serious fall or if you hit your head (even if there is no bleeding).  Some medicines may interact with Xarelto and might increase your  risk of bleeding while on Xarelto. To help avoid this, consult your healthcare provider or pharmacist prior to using any new prescription or non-prescription medications, including herbals, vitamins, non-steroidal anti-inflammatory drugs (NSAIDs) and supplements.  This website has more information on Xarelto: VisitDestination.com.br.

## 2019-02-02 NOTE — Telephone Encounter (Signed)
TCM Call: 1st attempt I left a message on the pts VM asking her to call us back. 

## 2019-02-02 NOTE — Telephone Encounter (Signed)
Patient contacted regarding discharge from [facility] on 02/01/2019.  Patient understands to follow up with provider Tereso Newcomer, PA-c on 02/12/2019 at 8:45 at [address] [facility] [address] in [place]. Patient understands discharge instructions? Yes Patient understands medications and regiment? Yes Patient understands to bring all medications to this visit? Yes

## 2019-02-10 ENCOUNTER — Other Ambulatory Visit: Payer: Self-pay

## 2019-02-10 ENCOUNTER — Emergency Department (HOSPITAL_COMMUNITY): Payer: Medicare Other

## 2019-02-10 ENCOUNTER — Encounter (HOSPITAL_COMMUNITY): Payer: Self-pay | Admitting: *Deleted

## 2019-02-10 ENCOUNTER — Emergency Department (HOSPITAL_COMMUNITY)
Admission: EM | Admit: 2019-02-10 | Discharge: 2019-02-10 | Disposition: A | Discharge status: Home / Self Care | Payer: Medicare Other | Attending: Emergency Medicine | Admitting: Emergency Medicine

## 2019-02-10 DIAGNOSIS — Z9049 Acquired absence of other specified parts of digestive tract: Secondary | ICD-10-CM | POA: Insufficient documentation

## 2019-02-10 DIAGNOSIS — R1012 Left upper quadrant pain: Secondary | ICD-10-CM | POA: Insufficient documentation

## 2019-02-10 DIAGNOSIS — R109 Unspecified abdominal pain: Secondary | ICD-10-CM

## 2019-02-10 DIAGNOSIS — E039 Hypothyroidism, unspecified: Secondary | ICD-10-CM | POA: Insufficient documentation

## 2019-02-10 DIAGNOSIS — Z79899 Other long term (current) drug therapy: Secondary | ICD-10-CM | POA: Diagnosis not present

## 2019-02-10 DIAGNOSIS — Z7901 Long term (current) use of anticoagulants: Secondary | ICD-10-CM | POA: Insufficient documentation

## 2019-02-10 LAB — COMPREHENSIVE METABOLIC PANEL
ALT: 20 U/L (ref 0–44)
AST: 21 U/L (ref 15–41)
Albumin: 3.7 g/dL (ref 3.5–5.0)
Alkaline Phosphatase: 79 U/L (ref 38–126)
Anion gap: 10 (ref 5–15)
BUN: 16 mg/dL (ref 8–23)
CALCIUM: 9.3 mg/dL (ref 8.9–10.3)
CO2: 25 mmol/L (ref 22–32)
Chloride: 101 mmol/L (ref 98–111)
Creatinine, Ser: 0.85 mg/dL (ref 0.44–1.00)
GFR calc Af Amer: >60 mL/min (ref >60)
GFR calc non Af Amer: >60 mL/min (ref >60)
Glucose, Bld: 108 mg/dL — ABNORMAL HIGH (ref 70–99)
Potassium: 4.4 mmol/L (ref 3.5–5.1)
Sodium: 136 mmol/L (ref 135–145)
TOTAL PROTEIN: 8.1 g/dL (ref 6.5–8.1)
Total Bilirubin: 0.5 mg/dL (ref 0.3–1.2)

## 2019-02-10 LAB — POC OCCULT BLOOD, ED
Fecal Occult Bld: NEGATIVE
Fecal Occult Bld: NEGATIVE

## 2019-02-10 LAB — CBC
HCT: 44.1 % (ref 36.0–46.0)
Hemoglobin: 13.7 g/dL (ref 12.0–15.0)
MCH: 29.1 pg (ref 26.0–34.0)
MCHC: 31.1 g/dL (ref 30.0–36.0)
MCV: 93.8 fL (ref 80.0–100.0)
Platelets: 374 10*3/uL (ref 150–400)
RBC: 4.7 MIL/uL (ref 3.87–5.11)
RDW: 14.7 % (ref 11.5–15.5)
WBC: 7.7 10*3/uL (ref 4.0–10.5)
nRBC: 0 % (ref 0.0–0.2)

## 2019-02-10 LAB — LIPASE, BLOOD: Lipase: 49 U/L (ref 11–51)

## 2019-02-10 MED ORDER — POLYETHYLENE GLYCOL 3350 17 G PO PACK: 17.0000 g | PACK | Freq: Every day | ORAL | 0 refills | Status: AC

## 2019-02-10 MED ORDER — IOPAMIDOL (ISOVUE-300) INJECTION 61%
INTRAVENOUS | Status: AC
  Filled 2019-02-10: qty 100

## 2019-02-10 MED ORDER — SODIUM CHLORIDE 0.9% FLUSH: 3.0000 mL | Freq: Once | INTRAVENOUS | Status: DC

## 2019-02-10 MED ORDER — SODIUM CHLORIDE (PF) 0.9 % IJ SOLN
INTRAMUSCULAR | Status: AC
  Filled 2019-02-10: qty 50

## 2019-02-10 MED ORDER — IOPAMIDOL (ISOVUE-300) INJECTION 61%
100.0000 mL | Freq: Once | INTRAVENOUS | Status: AC | PRN
  Administered 2019-02-10: 100 mL via INTRAVENOUS

## 2019-02-10 NOTE — ED Triage Notes (Signed)
Pt reports constipation, she has not had a regular BM since the 16th of Feb.  She states she has had small BMs.  She has taken laxatives without relief.  She also noticed rectal bleeding.  She is on Xarelto.  She endorses RUQ pain and slight rectal bleeding.

## 2019-02-10 NOTE — ED Provider Notes (Addendum)
[facility]-EMERGENCY DEPT Provider Note   CSN: [id number] Arrival date & time: 02/10/19  1154    History   Chief Complaint Chief Complaint  Patient presents with  . Constipation    HPI Abigail Ryan is a 73 y.o. female.     HPI Pt is concerned she might have an impacted colon.  Pt has not had a normal bowel movement since being in the hospital last month.  She was treated for sepsis and hepatic abscess.   SHe has been having pain in the left side of her abdomen.   No vomiting but she has been nauseated occasionally.  She has noticed blood in her stool recently and her doctor told her to stop her xarelto.   NO fevers. She called her doctor and was told to come to the ED.  Past Medical History:  Diagnosis Date  . Renal stones   . Thyroid disease     Patient Active Problem List   Diagnosis Date Noted  . Atrial fibrillation with RVR (HCC) 01/27/2019  . Liver abscess 01/27/2019  . Hypothyroid 01/25/2019  . Sepsis (HCC) 01/25/2019  . Elevated troponin 01/25/2019    Past Surgical History:  Procedure Laterality Date  . ABDOMINAL HYSTERECTOMY    . APPENDECTOMY    . CHOLECYSTECTOMY       OB History   No obstetric history on file.      Home Medications    Prior to Admission medications   Medication Sig Start Date End Date Taking? Authorizing Provider  acetaminophen (TYLENOL) 500 MG tablet Take 1,000 mg by mouth every 6 (six) hours as needed for moderate pain or fever.    [provider]  Aspirin-Salicylamide-Caffeine (BC HEADACHE POWDER PO) Take 1-2 packets by mouth 2 (two) times daily as needed (pain).    [provider]  cholecalciferol (VITAMIN D) 1000 UNITS tablet Take 1,500 Units by mouth daily.    [provider]  diltiazem (CARDIZEM CD) 180 MG 24 hr capsule Take 1 capsule (180 mg total) by mouth daily. 02/01/19   [place], Alvira Philips, DO  ibuprofen (ADVIL,MOTRIN) 200 MG tablet Take 200 mg by mouth every 6 (six) hours as  needed for mild pain.    [provider]  levofloxacin (LEVAQUIN) 750 MG tablet Take 1 tablet (750 mg total) by mouth daily for 21 days. 02/01/19 02/22/19  [place], Alvira Philips, DO  levothyroxine (SYNTHROID, LEVOTHROID) 150 MCG tablet Take 150 mcg by mouth at bedtime.    [provider]  metoprolol tartrate (LOPRESSOR) 50 MG tablet Take 1 tablet (50 mg total) by mouth 2 (two) times daily. 02/01/19   [place], Alvira Philips, DO  metroNIDAZOLE (FLAGYL) 500 MG tablet Take 1 tablet (500 mg total) by mouth 3 (three) times daily for 21 days. 02/01/19 02/22/19  [place], Eric J, DO  OMEGA-3 KRILL OIL PO Take 1 capsule by mouth daily.    [provider]  rivaroxaban (XARELTO) 20 MG TABS tablet Take 1 tablet (20 mg total) by mouth daily with supper. 02/01/19   [place], Alvira Philips, DO  traMADol (ULTRAM) 50 MG tablet Take 1 tablet (50 mg total) by mouth every 12 (twelve) hours as needed for severe pain. Patient not taking: Reported on 01/25/2019 08/16/15   Tomasita Crumble, MD    Family History Family History  Problem Relation Age of Onset  . Breast cancer Daughter   . Heart disease Mother 4  . Heart disease Father 23    Social History Social  History   Tobacco Use  . Smoking status: Never Smoker  . Smokeless tobacco: Never Used  Substance Use Topics  . Alcohol use: No  . Drug use: No     Allergies   Codeine and Morphine and related   Review of Systems Review of Systems   Physical Exam Updated Vital Signs BP 126/72 (BP Location: Left Arm)   Pulse 62   Temp 98.4 F (36.9 C) (Oral)   Resp 20   Ht 1.626 m ( )   Wt 106.6 kg   SpO2 96%   BMI 40.34 kg/m   Physical Exam Vitals signs and nursing note reviewed.  Constitutional:      General: She is not in acute distress.    Appearance: She is well-developed.  HENT:     Head: Normocephalic and atraumatic.     Right Ear: External ear normal.     Left Ear: External ear normal.  Eyes:     General: No scleral icterus.        Right eye: No discharge.        Left eye: No discharge.     Conjunctiva/sclera: Conjunctivae normal.  Neck:     Musculoskeletal: Neck supple.     Trachea: No tracheal deviation.  Cardiovascular:     Rate and Rhythm: Normal rate and regular rhythm.  Pulmonary:     Effort: Pulmonary effort is normal. No respiratory distress.     Breath sounds: Normal breath sounds. No stridor. No wheezing or rales.  Abdominal:     General: Bowel sounds are normal. There is no distension.     Palpations: Abdomen is soft.     Tenderness: There is abdominal tenderness. There is no guarding or rebound.  Musculoskeletal:        General: No tenderness.  Skin:    General: Skin is warm and dry.     Findings: No rash.  Neurological:     Mental Status: She is alert.     Cranial Nerves: No cranial nerve deficit (no facial droop, extraocular movements intact, no slurred speech).     Sensory: No sensory deficit.     Motor: No abnormal muscle tone or seizure activity.     Coordination: Coordination normal.      ED Treatments / Results  Labs (all labs ordered are listed, but only abnormal results are displayed) Labs Reviewed  CBC  LIPASE, BLOOD  COMPREHENSIVE METABOLIC PANEL  URINALYSIS, ROUTINE W REFLEX MICROSCOPIC    EKG None  Radiology No results found.  Procedures Procedures (including critical care time)  Medications Ordered in ED Medications  sodium chloride flush (NS) 0.9 % injection 3 mL (has no administration in time range)     Initial Impression / Assessment and Plan / ED Course  I have reviewed the triage vital signs and the nursing notes.  Pertinent labs & imaging results that were available during my care of the patient were reviewed by me and considered in my medical decision making (see chart for details).  Clinical Course as of Feb 11 1204  Wed Feb 10, 2019  1502 Labs normal.  With her recent history and luq pain will ct to evaluate further   [JK]    Clinical Course  User Index [JK] Linwood Dibbles, MD     Labs reassuring.  CT scan ordered for further evaluation considering her recent history, evaluate for diverticulitis etc.  Care turned over to Dr Lockie Mola.  Final Clinical Impressions(s) / ED Diagnoses   pending  Linwood Dibbles, MD 02/11/19 1206    Linwood Dibbles, MD 02/22/19 2350

## 2019-02-10 NOTE — ED Notes (Signed)
Pt is alert and oriented x 4 and is verbally responsive pt denies pain at this tie and reports tha she has not had a good BM since her hospital admission here at [facility] on Feb . 24(  D/t abscess on her liver per pt). Pt is escorted with her dtr at this time . Pt has some abdominal distention. Pt abdomen is soft to touch + BS x 4 quads.

## 2019-02-10 NOTE — ED Provider Notes (Signed)
Assumed care from Dr. Lynelle Doctor at 5 PM with patient awaiting CT abdomen pelvis.  Patient here with abdominal pain that was generalized and constipation.  Patient had recent hepatic abscess.  Lab work overall unremarkable.  Vitals normal.  CT scan pending to evaluate for any new intra-abdominal process.  If CT imaging is unremarkable Dr. Lynelle Doctor recommends laxatives.  Patient had no stool ball on exam, no GI bleed.  CT scan shows overall improvement of known liver abscess.  Patient currently still on antibiotics for this.  Is being followed by IR.  Patient with otherwise unremarkable CT scan.  Possibly musculoskeletal pain as patient has mostly point tenderness over the rib cage, she has been coughing and has been using [employer].  Recommend use of tea and honey as well.  Given MiraLAX for possible constipation as well.  Discharged in good condition.  Recommend follow-up with primary care doctor.  This chart was dictated using voice recognition software.  Despite best efforts to proofread,  errors can occur which can change the documentation meaning.     Virgina Norfolk, DO 02/10/19 1729

## 2019-02-10 NOTE — ED Notes (Signed)
Pt made aware of need for urine specimen 

## 2019-02-10 NOTE — ED Notes (Signed)
Pt made aware urine specimen is needed.  

## 2019-02-12 ENCOUNTER — Ambulatory Visit: Payer: Medicare Other | Admitting: Physician Assistant

## 2019-02-18 ENCOUNTER — Encounter (HOSPITAL_COMMUNITY): Payer: Self-pay

## 2019-02-18 ENCOUNTER — Emergency Department (HOSPITAL_COMMUNITY): Payer: Medicare Other

## 2019-02-18 ENCOUNTER — Inpatient Hospital Stay (HOSPITAL_COMMUNITY): Payer: Medicare Other

## 2019-02-18 ENCOUNTER — Other Ambulatory Visit: Payer: Self-pay

## 2019-02-18 ENCOUNTER — Observation Stay (HOSPITAL_COMMUNITY)
Admission: EM | Admit: 2019-02-18 | Discharge: 2019-02-19 | Disposition: A | Discharge status: Home / Self Care | Payer: Medicare Other | Attending: Family Medicine | Admitting: Family Medicine

## 2019-02-18 DIAGNOSIS — Z7982 Long term (current) use of aspirin: Secondary | ICD-10-CM | POA: Diagnosis not present

## 2019-02-18 DIAGNOSIS — Z791 Long term (current) use of non-steroidal anti-inflammatories (NSAID): Secondary | ICD-10-CM | POA: Insufficient documentation

## 2019-02-18 DIAGNOSIS — Z7984 Long term (current) use of oral hypoglycemic drugs: Secondary | ICD-10-CM | POA: Insufficient documentation

## 2019-02-18 DIAGNOSIS — Z79899 Other long term (current) drug therapy: Secondary | ICD-10-CM | POA: Insufficient documentation

## 2019-02-18 DIAGNOSIS — E875 Hyperkalemia: Secondary | ICD-10-CM | POA: Diagnosis not present

## 2019-02-18 DIAGNOSIS — E039 Hypothyroidism, unspecified: Secondary | ICD-10-CM | POA: Diagnosis present

## 2019-02-18 DIAGNOSIS — G510 Bell's palsy: Secondary | ICD-10-CM

## 2019-02-18 DIAGNOSIS — E785 Hyperlipidemia, unspecified: Secondary | ICD-10-CM | POA: Insufficient documentation

## 2019-02-18 DIAGNOSIS — Z7901 Long term (current) use of anticoagulants: Secondary | ICD-10-CM | POA: Diagnosis not present

## 2019-02-18 DIAGNOSIS — E1169 Type 2 diabetes mellitus with other specified complication: Secondary | ICD-10-CM

## 2019-02-18 DIAGNOSIS — R202 Paresthesia of skin: Secondary | ICD-10-CM

## 2019-02-18 DIAGNOSIS — I4891 Unspecified atrial fibrillation: Secondary | ICD-10-CM | POA: Diagnosis present

## 2019-02-18 DIAGNOSIS — E119 Type 2 diabetes mellitus without complications: Secondary | ICD-10-CM | POA: Insufficient documentation

## 2019-02-18 DIAGNOSIS — Z8719 Personal history of other diseases of the digestive system: Secondary | ICD-10-CM

## 2019-02-18 DIAGNOSIS — I10 Essential (primary) hypertension: Secondary | ICD-10-CM | POA: Insufficient documentation

## 2019-02-18 DIAGNOSIS — K5730 Diverticulosis of large intestine without perforation or abscess without bleeding: Secondary | ICD-10-CM | POA: Diagnosis not present

## 2019-02-18 DIAGNOSIS — I639 Cerebral infarction, unspecified: Principal | ICD-10-CM | POA: Diagnosis present

## 2019-02-18 DIAGNOSIS — Z885 Allergy status to narcotic agent status: Secondary | ICD-10-CM | POA: Diagnosis not present

## 2019-02-18 DIAGNOSIS — K449 Diaphragmatic hernia without obstruction or gangrene: Secondary | ICD-10-CM | POA: Insufficient documentation

## 2019-02-18 LAB — COMPREHENSIVE METABOLIC PANEL
ALT: 11 U/L (ref 0–44)
AST: 76 U/L — ABNORMAL HIGH (ref 15–41)
Albumin: 3.1 g/dL — ABNORMAL LOW (ref 3.5–5.0)
Alkaline Phosphatase: 73 U/L (ref 38–126)
Anion gap: 7 (ref 5–15)
BILIRUBIN TOTAL: 1.9 mg/dL — AB (ref 0.3–1.2)
BUN: 12 mg/dL (ref 8–23)
CO2: 21 mmol/L — ABNORMAL LOW (ref 22–32)
Calcium: 8.3 mg/dL — ABNORMAL LOW (ref 8.9–10.3)
Chloride: 109 mmol/L (ref 98–111)
Creatinine, Ser: 0.75 mg/dL (ref 0.44–1.00)
GFR calc Af Amer: >60 mL/min (ref >60)
GFR calc non Af Amer: >60 mL/min (ref >60)
Glucose, Bld: 117 mg/dL — ABNORMAL HIGH (ref 70–99)
Potassium: 6 mmol/L — ABNORMAL HIGH (ref 3.5–5.1)
Sodium: 137 mmol/L (ref 135–145)
TOTAL PROTEIN: 6 g/dL — AB (ref 6.5–8.1)

## 2019-02-18 LAB — URINALYSIS, ROUTINE W REFLEX MICROSCOPIC
Bilirubin Urine: NEGATIVE
Glucose, UA: NEGATIVE mg/dL
Hgb urine dipstick: NEGATIVE
Ketones, ur: NEGATIVE mg/dL
Leukocytes,Ua: NEGATIVE
NITRITE: NEGATIVE
Protein, ur: NEGATIVE mg/dL
SPECIFIC GRAVITY, URINE: 1.015 (ref 1.005–1.030)
pH: 5 (ref 5.0–8.0)

## 2019-02-18 LAB — RAPID URINE DRUG SCREEN, HOSP PERFORMED
Amphetamines: NOT DETECTED
Barbiturates: NOT DETECTED
Benzodiazepines: NOT DETECTED
Cocaine: NOT DETECTED
Opiates: POSITIVE — AB
Tetrahydrocannabinol: NOT DETECTED

## 2019-02-18 LAB — POCT I-STAT EG7
Acid-Base Excess: 1 mmol/L (ref 0.0–2.0)
Bicarbonate: 23.8 mmol/L (ref 20.0–28.0)
Calcium, Ion: 1.01 mmol/L — ABNORMAL LOW (ref 1.15–1.40)
HCT: 39 % (ref 36.0–46.0)
Hemoglobin: 13.3 g/dL (ref 12.0–15.0)
O2 Saturation: 93 %
POTASSIUM: 4.6 mmol/L (ref 3.5–5.1)
SODIUM: 138 mmol/L (ref 135–145)
TCO2: 25 mmol/L (ref 22–32)
pCO2, Ven: 31 mmHg — ABNORMAL LOW (ref 44.0–60.0)
pH, Ven: 7.494 — ABNORMAL HIGH (ref 7.250–7.430)
pO2, Ven: 59 mmHg — ABNORMAL HIGH (ref 32.0–45.0)

## 2019-02-18 LAB — CBG MONITORING, ED: GLUCOSE-CAPILLARY: 103 mg/dL — AB (ref 70–99)

## 2019-02-18 LAB — PROTIME-INR
INR: 1.1 (ref 0.8–1.2)
PROTHROMBIN TIME: 14 s (ref 11.4–15.2)

## 2019-02-18 LAB — APTT: aPTT: 20 seconds — ABNORMAL LOW (ref 24–36)

## 2019-02-18 LAB — I-STAT CREATININE, ED: Creatinine, Ser: 0.7 mg/dL (ref 0.44–1.00)

## 2019-02-18 LAB — ETHANOL: Alcohol, Ethyl (B): <10 mg/dL (ref <10)

## 2019-02-18 MED ORDER — LEVOTHYROXINE SODIUM 75 MCG PO TABS
150.0000 ug | ORAL_TABLET | Freq: Every day | ORAL | Status: DC
  Administered 2019-02-18: 150 ug via ORAL
  Filled 2019-02-18: qty 2

## 2019-02-18 MED ORDER — IBUPROFEN 200 MG PO TABS
200.0000 mg | ORAL_TABLET | Freq: Four times a day (QID) | ORAL | Status: DC | PRN
  Administered 2019-02-18 – 2019-02-19 (×3): 200 mg via ORAL
  Filled 2019-02-18 (×3): qty 1

## 2019-02-18 MED ORDER — ACETAMINOPHEN 160 MG/5ML PO SOLN: 650.0000 mg | ORAL | Status: DC | PRN

## 2019-02-18 MED ORDER — METOPROLOL TARTRATE 50 MG PO TABS
50.0000 mg | ORAL_TABLET | Freq: Two times a day (BID) | ORAL | Status: DC
  Administered 2019-02-18 – 2019-02-19 (×2): 50 mg via ORAL
  Filled 2019-02-18 (×2): qty 1

## 2019-02-18 MED ORDER — ACETAMINOPHEN 500 MG PO TABS: 1000.0000 mg | ORAL_TABLET | Freq: Four times a day (QID) | ORAL | Status: DC | PRN

## 2019-02-18 MED ORDER — SENNOSIDES-DOCUSATE SODIUM 8.6-50 MG PO TABS: 1.0000 | ORAL_TABLET | Freq: Every evening | ORAL | Status: DC | PRN

## 2019-02-18 MED ORDER — LORAZEPAM 2 MG/ML IJ SOLN
1.0000 mg | Freq: Once | INTRAMUSCULAR | Status: AC
  Administered 2019-02-18: 1 mg via INTRAVENOUS
  Filled 2019-02-18: qty 1

## 2019-02-18 MED ORDER — POLYETHYLENE GLYCOL 3350 17 G PO PACK
17.0000 g | PACK | Freq: Every day | ORAL | Status: DC
  Filled 2019-02-18: qty 1

## 2019-02-18 MED ORDER — SODIUM CHLORIDE 0.9 % IV SOLN
INTRAVENOUS | Status: DC
  Administered 2019-02-18: 22:00:00 via INTRAVENOUS

## 2019-02-18 MED ORDER — DILTIAZEM HCL ER COATED BEADS 180 MG PO CP24
180.0000 mg | ORAL_CAPSULE | Freq: Every day | ORAL | Status: DC
  Administered 2019-02-18 – 2019-02-19 (×2): 180 mg via ORAL
  Filled 2019-02-18 (×2): qty 1

## 2019-02-18 MED ORDER — BENZONATATE 100 MG PO CAPS: 200.0000 mg | ORAL_CAPSULE | Freq: Three times a day (TID) | ORAL | Status: DC | PRN

## 2019-02-18 MED ORDER — ASPIRIN 81 MG PO CHEW
324.0000 mg | CHEWABLE_TABLET | Freq: Every day | ORAL | Status: DC
  Administered 2019-02-19: 324 mg via ORAL
  Filled 2019-02-18: qty 4

## 2019-02-18 MED ORDER — IOHEXOL 350 MG/ML SOLN
75.0000 mL | Freq: Once | INTRAVENOUS | Status: AC | PRN
  Administered 2019-02-18: 75 mL via INTRAVENOUS

## 2019-02-18 MED ORDER — ACETAMINOPHEN 650 MG RE SUPP: 650.0000 mg | RECTAL | Status: DC | PRN

## 2019-02-18 MED ORDER — ATORVASTATIN CALCIUM 40 MG PO TABS
40.0000 mg | ORAL_TABLET | Freq: Every day | ORAL | Status: DC
  Administered 2019-02-18: 40 mg via ORAL
  Filled 2019-02-18: qty 1

## 2019-02-18 MED ORDER — VITAMIN D 25 MCG (1000 UNIT) PO TABS
1500.0000 [IU] | ORAL_TABLET | Freq: Every day | ORAL | Status: DC
  Administered 2019-02-19: 1500 [IU] via ORAL
  Filled 2019-02-18: qty 2

## 2019-02-18 MED ORDER — STROKE: EARLY STAGES OF RECOVERY BOOK
Freq: Once | Status: AC
  Administered 2019-02-18: 22:00:00

## 2019-02-18 MED ORDER — ACETAMINOPHEN 325 MG PO TABS: 650.0000 mg | ORAL_TABLET | ORAL | Status: DC | PRN

## 2019-02-18 NOTE — ED Notes (Signed)
Pt CBG was 103, notified Julie(RN)

## 2019-02-18 NOTE — Consult Note (Signed)
Neurology Consultation Reason for Consult: Left-sided weakness Referring Physician: Effie Shy, E  CC: Left-sided weakness  History is obtained from: Patient  HPI: Abigail Ryan is a 73 y.o. female with a history of atrial fibrillation who had been on anticoagulation up until last week when she was evaluated with a lower GI bleed.  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  She presents presents with left facial weakness that started on awakening from a nap at 1 PM.  She laid down to the nap around 10 AM and when she awoke she noticed that her left face was numb.  She tried to eat a sandwich and "bit the [place] Humphrey" out of her lip.  She denies any difficulty walking, and did not notice any weakness of her arm or leg.  On arrival to the ED, it was noted that she did have weakness of her left arm and leg.  Head CT was negative.   LKW: 10 AM tpa given?: no, out of window   ROS: A 14 point ROS was performed and is negative except as noted in the HPI.   Past Medical History:  Diagnosis Date  . Renal stones   . Thyroid disease      Family History  Problem Relation Age of Onset  . Breast cancer Daughter   . Heart disease Mother 47  . Heart disease Father 27     Social History:  reports that she has never smoked. She has never used smokeless tobacco. She reports that she does not drink alcohol or use drugs.   Exam: Current vital signs: BP (!) 143/89   Pulse 80   Temp 98.7 F (37.1 C) (Oral)   Resp (!) 21   SpO2 94%  Vital signs in last 24 hours: Temp:  [98.7 F (37.1 C)] 98.7 F (37.1 C) (03/12 1530) Pulse Rate:  [80-91] 80 (03/12 1630) Resp:  [17-21] 21 (03/12 1630) BP: (143-150)/(86-89) 143/89 (03/12 1630) SpO2:  [94 %-97 %] 94 % (03/12 1630)   Physical Exam  Constitutional: Appears well-developed and well-nourished.  Psych: Affect appropriate to situation Eyes: No scleral injection HENT: No OP obstrucion Head: Normocephalic.  Cardiovascular: Normal  rate and regular rhythm.  Respiratory: Effort normal, non-labored breathing GI: Soft.  No distension. There is no tenderness.  Skin: WDI  Neuro: Mental Status: Patient is awake, alert, oriented to person, place, month, year, and situation. Patient is able to give a clear and coherent history. No signs of aphasia or neglect Cranial Nerves: II: Visual Fields are full. Pupils are equal, round, and reactive to light.   III,IV, VI: EOMI without ptosis or diploplia.  V: Facial sensation is diminished on the left VII: Facial movement is mildly weak on the left, including decreased eye closure VIII: hearing is intact to voice X: Uvula elevates symmetrically XI: Shoulder shrug is symmetric. XII: tongue is midline without atrophy or fasciculations.  Motor: Tone is normal. Bulk is normal. 5/5 strength was present on the right, she has 4+/5 strength of the left arm and leg Sensory: Sensation is symmetric to light touch and temperature in the arms and legs. Deep Tendon Reflexes: No signs of hyperreflexia  cerebellar: Finger-nose-finger intact bilaterally  I have reviewed labs in epic and the results pertinent to this consultation are: Creatinine 0.7  I have reviewed the images obtained: CT head-negative  Impression: 73 year old female with new onset left face arm and leg weakness consistent with a small ischemic stroke.  She is outside  the IV TPA window and has no signs of large vessel occlusion.  She will need to be admitted for therapy evaluation and secondary risk factor modification.  I am unclear on what is causing her to have bilateral hand numbness, but I will add a cervical spine MRI to her brain to further evaluate this.  She will need anticoagulation over the long-term, but for the short-term would favor antiplatelet therapy given her recent GI bleeding.  Recommendations: - HgbA1c, fasting lipid panel - MRI of the brain without contrast - Frequent neuro checks -  Echocardiogram - CTA head and neck - Prophylactic therapy-Antiplatelet med: Aspirin - dose 325mg  PO or 300mg  PR - Risk factor modification - Telemetry monitoring - PT consult, OT consult, Speech consult - Stroke team to follow -MRI cervical spine without contrast to evaluate bilateral hand numbness.   Ritta Slot, MD [facility] [phone]  If 7pm- 7am, please page neurology on call as listed in AMION.

## 2019-02-18 NOTE — ED Notes (Signed)
Attempt report, was told they needed to look at the chart first. I will call back in a few minutes

## 2019-02-18 NOTE — ED Provider Notes (Signed)
[facility] EMERGENCY DEPARTMENT Provider Note   CSN: [id number] Arrival date & time: 02/18/19  1520    History   Chief Complaint Chief Complaint  Patient presents with   Numbness   Facial Droop   Code Stroke    HPI Abigail Ryan is a 73 y.o. female.     HPI   She presents for evaluation of weakness in the left side of her face, by EMS.  She recalls being well at 10 AM this morning, took a nap and awoke again at 1245 with "my face drawing."  Later she was eating lunch and bit her lip, while she was eating.  She has had a discomfort in her left head, and neck, since yesterday.  She denies chest pain.  She had been on Xarelto until last week when she was told to stop taking it because of blood in bowel movement.  She has a history of atrial fibrillation.  She denies fever, chills, cough, nausea, vomiting, change in bowel or urinary habits.  She is taking her other medications as prescribed.  She is recently been diagnosed with a abscess in her liver, for which she is taking Levaquin.  There are no other known modifying factors.  Past Medical History:  Diagnosis Date   Renal stones    Thyroid disease     Patient Active Problem List   Diagnosis Date Noted   Atrial fibrillation with RVR (HCC) 01/27/2019   Liver abscess 01/27/2019   Hypothyroid 01/25/2019   Sepsis (HCC) 01/25/2019   Elevated troponin 01/25/2019    Past Surgical History:  Procedure Laterality Date   ABDOMINAL HYSTERECTOMY     APPENDECTOMY     CHOLECYSTECTOMY       OB History   No obstetric history on file.      Home Medications    Prior to Admission medications   Medication Sig Start Date End Date Taking? Authorizing Provider  acetaminophen (TYLENOL) 500 MG tablet Take 1,000 mg by mouth every 6 (six) hours as needed for moderate pain or fever.   Yes [provider]  ASPIRIN PO Take 351 mg by mouth daily.   Yes [provider]  benzonatate (TESSALON)  200 MG capsule Take 200 mg by mouth 3 (three) times daily as needed for cough.  02/03/19  Yes [provider]  cholecalciferol (VITAMIN D) 1000 UNITS tablet Take 1,500 Units by mouth daily.   Yes [provider]  diltiazem (CARDIZEM CD) 180 MG 24 hr capsule Take 1 capsule (180 mg total) by mouth daily. 02/01/19  Yes [place], Eric J, DO  ibuprofen (ADVIL,MOTRIN) 200 MG tablet Take 200 mg by mouth every 6 (six) hours as needed for mild pain.   Yes [provider]  levofloxacin (LEVAQUIN) 750 MG tablet Take 1 tablet (750 mg total) by mouth daily for 21 days. 02/01/19 02/22/19 Yes [place], Alvira Philips, DO  levothyroxine (SYNTHROID, LEVOTHROID) 150 MCG tablet Take 150 mcg by mouth at bedtime.   Yes [provider]  metoprolol tartrate (LOPRESSOR) 50 MG tablet Take 1 tablet (50 mg total) by mouth 2 (two) times daily. 02/01/19  Yes [place], Eric J, DO  metroNIDAZOLE (FLAGYL) 500 MG tablet Take 1 tablet (500 mg total) by mouth 3 (three) times daily for 21 days. 02/01/19 02/22/19 Yes [place], Eric J, DO  polyethylene glycol (MIRALAX / GLYCOLAX) packet Take 17 g by mouth daily for 14 days. 02/10/19 02/24/19 Yes [place], Adam, DO  rivaroxaban (XARELTO) 20 MG TABS  tablet Take 1 tablet (20 mg total) by mouth daily with supper. 02/01/19  Yes [place], Alvira Philips, DO  UNABLE TO FIND Med Name:  Immune protect supplement   Yes [provider]  UNABLE TO FIND Med Name: Cardio BP supplement   Yes [provider]  UNABLE TO FIND Med Name: Probiotic Complete Supplement   Yes [provider]  traMADol (ULTRAM) 50 MG tablet Take 1 tablet (50 mg total) by mouth every 12 (twelve) hours as needed for severe pain. Patient not taking: Reported on 02/10/2019 08/16/15   Tomasita Crumble, MD    Family History Family History  Problem Relation Age of Onset   Breast cancer Daughter    Heart disease Mother 7   Heart disease Father 29    Social History Social History   Tobacco Use    Smoking status: Never Smoker   Smokeless tobacco: Never Used  Substance Use Topics   Alcohol use: No   Drug use: No     Allergies   Codeine and Morphine and related   Review of Systems Review of Systems  All other systems reviewed and are negative.    Physical Exam Updated Vital Signs BP (!) 146/88 (BP Location: Left Arm)    Pulse 86    Temp 98.3 F (36.8 C) (Oral)    Resp 16    SpO2 94%   Physical Exam Vitals signs and nursing note reviewed.  Constitutional:      General: She is not in acute distress.    Appearance: She is well-developed. She is obese. She is not ill-appearing, toxic-appearing or diaphoretic.  HENT:     Head: Normocephalic and atraumatic.     Right Ear: External ear normal.     Left Ear: External ear normal.     Nose: Nose normal.     Mouth/Throat:     Pharynx: No oropharyngeal exudate or posterior oropharyngeal erythema.  Eyes:     Conjunctiva/sclera: Conjunctivae normal.     Pupils: Pupils are equal, round, and reactive to light.  Neck:     Musculoskeletal: Normal range of motion and neck supple.     Trachea: Phonation normal.  Cardiovascular:     Rate and Rhythm: Normal rate and regular rhythm.     Heart sounds: Normal heart sounds.  Pulmonary:     Effort: Pulmonary effort is normal.     Breath sounds: Normal breath sounds.  Abdominal:     General: There is no distension.     Palpations: Abdomen is soft.     Tenderness: There is no abdominal tenderness.  Musculoskeletal: Normal range of motion.  Skin:    General: Skin is warm and dry.  Neurological:     Mental Status: She is alert and oriented to person, place, and time.     Sensory: No sensory deficit.     Motor: No abnormal muscle tone.     Coordination: Coordination normal.     Comments: No dysarthria or aphasia.  Left facial weakness is present.  Left grip strength less than right.  She can elevate left leg off the stretcher however drops that after 3 or 4 seconds.  Psychiatric:         Mood and Affect: Mood normal.        Behavior: Behavior normal.        Thought Content: Thought content normal.        Judgment: Judgment normal.      ED Treatments / Results  Labs (  all labs ordered are listed, but only abnormal results are displayed) Labs Reviewed  APTT - Abnormal; Notable for the following components:      Result Value   aPTT 20 (*)    All other components within normal limits  COMPREHENSIVE METABOLIC PANEL - Abnormal; Notable for the following components:   Potassium 6.0 (*)    CO2 21 (*)    Glucose, Bld 117 (*)    Calcium 8.3 (*)    Total Protein 6.0 (*)    Albumin 3.1 (*)    AST 76 (*)    Total Bilirubin 1.9 (*)    All other components within normal limits  CBG MONITORING, ED - Abnormal; Notable for the following components:   Glucose-Capillary 103 (*)    All other components within normal limits  POCT I-STAT EG7 - Abnormal; Notable for the following components:   pH, Ven 7.494 (*)    pCO2, Ven 31.0 (*)    pO2, Ven 59.0 (*)    Calcium, Ion 1.01 (*)    All other components within normal limits  ETHANOL  PROTIME-INR  URINALYSIS, ROUTINE W REFLEX MICROSCOPIC  RAPID URINE DRUG SCREEN, HOSP PERFORMED  I-STAT CREATININE, ED    EKG None  Radiology Ct Head Code Stroke Wo Contrast  Result Date: 02/18/2019 CLINICAL DATA:  Code stroke. Patient awoke at 12:45 today with slurred speech and LEFT-sided numbness. EXAM: CT HEAD WITHOUT CONTRAST TECHNIQUE: Contiguous axial images were obtained from the base of the skull through the vertex without intravenous contrast. COMPARISON:  None. FINDINGS: Brain: No evidence of acute infarction, hemorrhage, hydrocephalus, extra-axial collection or mass lesion/mass effect. Normal for age cerebral volume and white matter appearance. Vascular: Calcification of the cavernous internal carotid arteries consistent with cerebrovascular atherosclerotic disease. No signs of intracranial large vessel occlusion. Skull: Intact.  Sinuses/Orbits: Negative. Other: None. ASPECTS [facility] Stroke Program Early CT Score) - Ganglionic level infarction (caudate, lentiform nuclei, internal capsule, insula, M1-M3 cortex): 7 - Supraganglionic infarction (M4-M6 cortex): 3 Total score (0-10 with 10 being normal): 10 IMPRESSION: 1. Negative exam. 2. ASPECTS is 10. * These results were communicated to Dr. Otelia Limes at 4:30 pmon 3/12/2020by text page via the [facility] messaging system. Electronically Signed   By: Elsie Stain M.D.   On: 02/18/2019 16:31    Procedures Procedures (including critical care time)  Medications Ordered in ED Medications  LORazepam (ATIVAN) injection 1 mg (has no administration in time range)     Initial Impression / Assessment and Plan / ED Course  I have reviewed the triage vital signs and the nursing notes.  Pertinent labs & imaging results that were available during my care of the patient were reviewed by me and considered in my medical decision making (see chart for details).  Clinical Course as of Feb 18 1804  Thu Feb 18, 2019  1600 Initiated code stroke evaluation   [EW]  1640 Case discussed with neuro hospitalist, who has seen the patient.  He agrees that she has had a stroke and needs additional imaging and observation.   [EW]  1801 Normal  Urinalysis, Routine w reflex microscopic [EW]  1802 Normal  Ethanol [EW]  1802 Normal except potassium elevated, hemolysis noted on sample.  Also abnormal, CO2 low, glucose high, calcium low, total protein low, albumin low, AST high, total bilirubin high  Comprehensive metabolic panel(!) [EW]  1802 Venous gas, essentially normal  POCT I-Stat EG7(!) [EW]  1803 No CVA, no tumor, images reviewed by me  CT HEAD CODE  STROKE WO CONTRAST [EW]    Clinical Course User Index [EW] Mancel Bale, MD        Patient Vitals for the past 24 hrs:  BP Temp Temp src Pulse Resp SpO2  02/18/19 1744 (!) 146/88 98.3 F (36.8 C) Oral 86 16 94 %  02/18/19 1715 (!) 150/82  -- -- 87 14 91 %  02/18/19 1700 (!) 150/79 -- -- 85 17 95 %  02/18/19 1630 (!) 143/89 -- -- 80 (!) 21 94 %  02/18/19 1530 (!) 150/86 98.7 F (37.1 C) Oral 91 17 97 %    6:03 PM Reevaluation with update and discussion. After initial assessment and treatment, an updated evaluation reveals no change in clinical status, findings discussed with patient all questions were answered. Mancel Bale   Medical Decision Making: Clinical evidence for CVA, likely embolic related to A. fib, not currently anticoagulated.  Patient will need additional imaging, and treatment.  Patient had recent GI bleed, therefore anticoagulation is moderately risky.  Currently, she is in sinus rhythm.  CRITICAL CARE- yes Performed by: Mancel Bale  Nursing Notes Reviewed/ Care Coordinated Applicable Imaging Reviewed Interpretation of Laboratory Data incorporated into ED treatment   6:04 PM-Consult complete with hospitalist. Patient case explained and discussed.  He agrees to admit patient for further evaluation and treatment. Call ended at 6:18 PM  Plan: Admit   Final Clinical Impressions(s) / ED Diagnoses   Final diagnoses:  Cerebral infarction, unspecified mechanism [facility])  Paresthesia    ED Discharge Orders    None       Mancel Bale, MD 02/18/19 1819

## 2019-02-18 NOTE — ED Notes (Signed)
Neuro at bedside.

## 2019-02-18 NOTE — ED Notes (Signed)
ED TO INPATIENT HANDOFF REPORT  ED Nurse Name and Phone #: Raynelle Fanning [phone]  S Name/Age/Gender Abigail Ryan 73 y.o. female Room/Bed: 039C/039C  Code Status   Code Status: Prior  Home/SNF/Other Home Patient oriented to: self, place, time and situation Is this baseline? Yes   Triage Complete: Triage complete  Chief Complaint stroke like  Triage Note Pt here by EMS from home with slurred speech and left sided facial droop and numbness since 10 am. Pt reports going back to bed at 10 am and feeling normal and then waking up at 1245 with the symptoms.  Speech is getting better now and no problems with ambulation.  Arms and legs normal sensation left face is more numb than normal.    Allergies Allergies  Allergen Reactions  . Codeine Nausea Only and Other (See Comments)    Extreme headaches also  . Morphine And Related Other (See Comments)    Extreme headaches    Level of Care/Admitting Diagnosis ED Disposition    ED Disposition Condition Comment   Admit  Hospital Area: [facility] [[id number]]  Level of Care: Telemetry Medical [104]  Diagnosis: Acute CVA (cerebrovascular accident) [facility]) [[id number]]  Admitting Physician: Rometta Emery [[id number]]  Attending Physician: Rometta Emery [[id number]]  Estimated length of stay: past midnight tomorrow  Certification:: I certify this patient will need inpatient services for at least 2 midnights  PT Class (Do Not Modify): Inpatient [101]  PT Acc Code (Do Not Modify): Private [1]       B Medical/Surgery History Past Medical History:  Diagnosis Date  . Renal stones   . Thyroid disease    Past Surgical History:  Procedure Laterality Date  . ABDOMINAL HYSTERECTOMY    . APPENDECTOMY    . CHOLECYSTECTOMY       A IV Location/Drains/Wounds Patient Lines/Drains/Airways Status   Active Line/Drains/Airways    Name:   Placement date:   Placement time:   Site:   Days:   Peripheral IV 02/18/19 Right Hand   02/18/19    -     Hand   less than 1   Peripheral IV 02/18/19 Left Hand   02/18/19    -    Hand   less than 1   Peripheral IV 02/18/19 Right Antecubital   02/18/19    1657    Antecubital   less than 1   Wound / Incision (Open or Dehisced) 01/27/19 Puncture Abdomen Right;Upper   01/27/19    1720    Abdomen   22          Intake/Output Last 24 hours No intake or output data in the 24 hours ending 02/18/19 1849  Labs/Imaging Results for orders placed or performed during the hospital encounter of 02/18/19 (from the past 48 hour(s))  Ethanol     Status: None   Collection Time: 02/18/19  4:05 PM  Result Value Ref Range   Alcohol, Ethyl (B) <10 <10 mg/dL    Comment: (NOTE) Lowest detectable limit for serum alcohol is 10 mg/dL. For medical purposes only. Performed at [facility], [address], [place], [place] [address]   Protime-INR     Status: None   Collection Time: 02/18/19  4:05 PM  Result Value Ref Range   Prothrombin Time 14.0 11.4 - 15.2 seconds   INR 1.1 0.8 - 1.2    Comment: (NOTE) INR goal varies based on device and disease states. Performed at [facility], [address], [place], [place] [address]   APTT     Status: Abnormal   Collection Time: 02/18/19  4:05 PM  Result Value Ref Range   aPTT 20 (L) 24 - 36 seconds    Comment: Performed at [facility], [address], [place], [place] [address]  Comprehensive metabolic panel     Status: Abnormal   Collection Time: 02/18/19  4:05 PM  Result Value Ref Range   Sodium 137 135 - 145 mmol/L   Potassium 6.0 (H) 3.5 - 5.1 mmol/L    Comment: HEMOLYSIS AT THIS LEVEL MAY AFFECT RESULT   Chloride 109 98 - 111 mmol/L   CO2 21 (L) 22 - 32 mmol/L   Glucose, Bld 117 (H) 70 - 99 mg/dL   BUN 12 8 - 23 mg/dL   Creatinine, Ser 5.74 0.44 - 1.00 mg/dL   Calcium 8.3 (L) 8.9 - 10.3 mg/dL   Total Protein 6.0 (L) 6.5 - 8.1 g/dL   Albumin 3.1 (L) 3.5 - 5.0 g/dL   AST 76 (H) 15 - 41 U/L   ALT 11 0 - 44 U/L   Alkaline Phosphatase  73 38 - 126 U/L   Total Bilirubin 1.9 (H) 0.3 - 1.2 mg/dL   GFR calc non Af Amer >60 >60 mL/min   GFR calc Af Amer >60 >60 mL/min   Anion gap 7 5 - 15    Comment: Performed at [facility], [address] [place], [place] [address]  Urine rapid drug screen (hosp performed)     Status: Abnormal   Collection Time: 02/18/19  4:30 PM  Result Value Ref Range   Opiates POSITIVE (A) NONE DETECTED   Cocaine NONE DETECTED NONE DETECTED   Benzodiazepines NONE DETECTED NONE DETECTED   Amphetamines NONE DETECTED NONE DETECTED   Tetrahydrocannabinol NONE DETECTED NONE DETECTED   Barbiturates NONE DETECTED NONE DETECTED    Comment: (NOTE) DRUG SCREEN FOR MEDICAL PURPOSES ONLY.  IF CONFIRMATION IS NEEDED FOR ANY PURPOSE, NOTIFY LAB WITHIN 5 DAYS. LOWEST DETECTABLE LIMITS FOR URINE DRUG SCREEN Drug Class                     Cutoff (ng/mL) Amphetamine and metabolites    1000 Barbiturate and metabolites    200 Benzodiazepine                 200 Tricyclics and metabolites     300 Opiates and metabolites        300 Cocaine and metabolites        300 THC                            50 Performed at [facility], [address] [place], [place] [address]   Urinalysis, Routine w reflex microscopic     Status: None   Collection Time: 02/18/19  4:40 PM  Result Value Ref Range   Color, Urine YELLOW YELLOW   APPearance CLEAR CLEAR   Specific Gravity, Urine 1.015 1.005 - 1.030   pH 5.0 5.0 - 8.0   Glucose, UA NEGATIVE NEGATIVE mg/dL   Hgb urine dipstick NEGATIVE NEGATIVE   Bilirubin Urine NEGATIVE NEGATIVE   Ketones, ur NEGATIVE NEGATIVE mg/dL   Protein, ur NEGATIVE NEGATIVE mg/dL   Nitrite NEGATIVE NEGATIVE   Leukocytes,Ua NEGATIVE NEGATIVE    Comment: Performed at [facility], [address], [place], [place] [address]  I-stat  Creatinine, ED     Status: None   Collection Time: 02/18/19  4:50 PM  Result Value Ref Range   Creatinine, Ser 0.70 0.44 - 1.00 mg/dL  POCT I-Stat  EG7     Status: Abnormal   Collection Time: 02/18/19  4:50 PM  Result Value Ref Range   pH, Ven 7.494 (H) 7.250 - 7.430   pCO2, Ven 31.0 (L) 44.0 - 60.0 mmHg   pO2, Ven 59.0 (H) 32.0 - 45.0 mmHg   Bicarbonate 23.8 20.0 - 28.0 mmol/L   TCO2 25 22 - 32 mmol/L   O2 Saturation 93.0 %   Acid-Base Excess 1.0 0.0 - 2.0 mmol/L   Sodium 138 135 - 145 mmol/L   Potassium 4.6 3.5 - 5.1 mmol/L   Calcium, Ion 1.01 (L) 1.15 - 1.40 mmol/L   HCT 39.0 36.0 - 46.0 %   Hemoglobin 13.3 12.0 - 15.0 g/dL   Patient temperature HIDE    Sample type VENOUS   CBG monitoring, ED     Status: Abnormal   Collection Time: 02/18/19  5:19 PM  Result Value Ref Range   Glucose-Capillary 103 (H) 70 - 99 mg/dL   Comment 1 Notify RN    Comment 2 Document in Chart    Ct Head Code Stroke Wo Contrast  Result Date: 02/18/2019 CLINICAL DATA:  Code stroke. Patient awoke at 12:45 today with slurred speech and LEFT-sided numbness. EXAM: CT HEAD WITHOUT CONTRAST TECHNIQUE: Contiguous axial images were obtained from the base of the skull through the vertex without intravenous contrast. COMPARISON:  None. FINDINGS: Brain: No evidence of acute infarction, hemorrhage, hydrocephalus, extra-axial collection or mass lesion/mass effect. Normal for age cerebral volume and white matter appearance. Vascular: Calcification of the cavernous internal carotid arteries consistent with cerebrovascular atherosclerotic disease. No signs of intracranial large vessel occlusion. Skull: Intact. Sinuses/Orbits: Negative. Other: None. ASPECTS [facility] Stroke Program Early CT Score) - Ganglionic level infarction (caudate, lentiform nuclei, internal capsule, insula, M1-M3 cortex): 7 - Supraganglionic infarction (M4-M6 cortex): 3 Total score (0-10 with 10 being normal): 10 IMPRESSION: 1. Negative exam. 2. ASPECTS is 10. * These results were communicated to Dr. Otelia Limes at 4:30 pmon 3/12/2020by text page via the [facility] messaging system. Electronically Signed   By: Elsie Stain M.D.   On: 02/18/2019 16:31    Pending Labs [employer] (From admission, onward)    Start     Ordered   Signed and Held  Hemoglobin A1c  Tomorrow morning,   R     Signed and Held   Signed and Held  Lipid panel  Tomorrow morning,   R    Comments:  Fasting    Signed and Held   Signed and Held  CBC  Tomorrow morning,   R     Signed and Held   Signed and Held  Comprehensive metabolic panel  Tomorrow morning,   R     Signed and Held          Vitals/Pain Today's Vitals   02/18/19 1800 02/18/19 1815 02/18/19 1830 02/18/19 1845  BP: 139/73 (!) 158/75 (!) 176/82 (!) 143/74  Pulse: 86 88 88 86  Resp:   16 14  Temp:      TempSrc:      SpO2: 96% 97% 94% 96%  PainSc:        Isolation Precautions No active isolations  Medications Medications  LORazepam (ATIVAN) injection 1 mg (1 mg Intravenous Given 02/18/19 1845)  iohexol (OMNIPAQUE) 350 MG/ML injection 75  mL (75 mLs Intravenous Contrast Given 02/18/19 1813)    Mobility walks with person assist Low fall risk   Focused Assessments Neuro Assessment Handoff:  Swallow screen pass? will complete when pt returns from MRI. Pt went to MRI at 1850   NIH Stroke Scale ( + Modified Stroke Scale Criteria)  Interval: Initial Level of Consciousness (1a.)   : Alert, keenly responsive LOC Questions (1b. )   +: Answers both questions correctly LOC Commands (1c. )   + : Performs both tasks correctly Best Gaze (2. )  +: Normal Visual (3. )  +: No visual loss Facial Palsy (4. )    : Minor paralysis Motor Arm, Left (5a. )   +: No drift Motor Arm, Right (5b. )   +: No drift Motor Leg, Left (6a. )   +: No drift Motor Leg, Right (6b. )   +: No drift Limb Ataxia (7. ): Absent Sensory (8. )   +: Mild-to-moderate sensory loss, patient feels pinprick is less sharp or is dull on the affected side, or there is a loss of superficial pain with pinprick, but patient is aware of being touched Best Language (9. )   +: No  aphasia Dysarthria (10. ): Normal Extinction/Inattention (11.)   +: No Abnormality Modified SS Total  +: 1 Complete NIHSS TOTAL: 2 Last date known well: 02/18/19 Last time known well: 1000 Neuro Assessment: Exceptions to WDL Neuro Checks:   Initial (02/18/19 1548)  Last Documented NIHSS Modified Score: 1 (02/18/19 1548) Has TPA been given? No If patient is a Neuro Trauma and patient is going to OR before floor call report to 4N Charge nurse: [phone] or [phone]     R Recommendations: See Admitting Provider Note  Report given to:   Additional Notes:  Pt woke up feeling normal and went back to sleep at 10 am feeling baseline. Pt woke up at 1245 with slurred speech and numbness in mouth. She bit her lip when she ate lunch and left side of lower lip is swollen. Pt also has numbness on left side when using pin prick it feels duller than right side. Speech has resolved. Family at bedside. Pt currently in MRI

## 2019-02-18 NOTE — ED Notes (Signed)
Patient transported to CT with RN 

## 2019-02-18 NOTE — Progress Notes (Signed)
Pt admitted from ED with stroke like symptoms, alert and oriented, c/o of pain behind the neck, settled in bed with call light at bedside, tele monitor put and verified on pt, was however reassured and will continue to monitor, safety concern addressed accordingly, v/s stable. Abigail Ryan, [name]

## 2019-02-18 NOTE — ED Notes (Signed)
Patient transported to CT 

## 2019-02-18 NOTE — ED Triage Notes (Signed)
Pt here by EMS from home with slurred speech and left sided facial droop and numbness since 10 am. Pt reports going back to bed at 10 am and feeling normal and then waking up at 1245 with the symptoms.  Speech is getting better now and no problems with ambulation.  Arms and legs normal sensation left face is more numb than normal.

## 2019-02-18 NOTE — H&P (Signed)
History and Physical   Abigail Ryan ZOX:[id number] DOB: [birth year] DOA: 02/18/2019  Referring MD/NP/PA: Dr. Effie Shy  PCP: Kaleen Mask, MD   Outpatient Specialists: None  Patient coming from: Home  Chief Complaint: Left-sided weakness and facial weakness  HPI: Abigail Ryan is a 73 y.o. female with medical history significant of atrial fibrillation, hypertension, and hypothyroidism who has been on Xarelto but had rectal bleed a week ago.  Patient Xarelto was discontinued.  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  She went to bed this morning around 10:00 and woke up around 1 PM when she noticed left facial weakness.  When she tried to eat a sandwich she could not bite it.  Patient was able to walk around the house but felt numb in her face.  She came to the ER where she was noted to have left arm and left lower extremity weakness as well.  Initial code stroke was called but she was outside the window for any TPA.  Subsequent head CT was negative.  Patient is being admitted with possible CVA.  Work-up to continue.  Neurology consulted..  ED Course: Vitals are stable except for blood pressure 176/82.  Chemistry appears within normal with glucose 117 and calcium 8.3.  Urinalysis so far negative.  CT head and CT angiogram were both negative.  EKG showed normal sinus rhythm.  Patient is being admitted with possible acute CVA.  Review of Systems: As per HPI otherwise 10 point review of systems negative.    Past Medical History:  Diagnosis Date  . Renal stones   . Thyroid disease     Past Surgical History:  Procedure Laterality Date  . ABDOMINAL HYSTERECTOMY    . APPENDECTOMY    . CHOLECYSTECTOMY       reports that she has never smoked. She has never used smokeless tobacco. She reports that she does not drink alcohol or use drugs.  Allergies  Allergen Reactions  . Codeine Nausea Only and Other (See Comments)    Extreme headaches also  . Morphine And  Related Other (See Comments)    Extreme headaches    Family History  Problem Relation Age of Onset  . Breast cancer Daughter   . Heart disease Mother 66  . Heart disease Father 74     Prior to Admission medications   Medication Sig Start Date End Date Taking? Authorizing Provider  acetaminophen (TYLENOL) 500 MG tablet Take 1,000 mg by mouth every 6 (six) hours as needed for moderate pain or fever.   Yes [provider]  ASPIRIN PO Take 351 mg by mouth daily.   Yes [provider]  benzonatate (TESSALON) 200 MG capsule Take 200 mg by mouth 3 (three) times daily as needed for cough.  02/03/19  Yes [provider]  cholecalciferol (VITAMIN D) 1000 UNITS tablet Take 1,500 Units by mouth daily.   Yes [provider]  diltiazem (CARDIZEM CD) 180 MG 24 hr capsule Take 1 capsule (180 mg total) by mouth daily. 02/01/19  Yes [place], Eric J, DO  ibuprofen (ADVIL,MOTRIN) 200 MG tablet Take 200 mg by mouth every 6 (six) hours as needed for mild pain.   Yes [provider]  levofloxacin (LEVAQUIN) 750 MG tablet Take 1 tablet (750 mg total) by mouth daily for 21 days. 02/01/19 02/22/19 Yes [place], Alvira Philips, DO  levothyroxine (SYNTHROID, LEVOTHROID) 150 MCG tablet Take 150 mcg by mouth at bedtime.   Yes [provider]  metoprolol tartrate (LOPRESSOR) 50 MG tablet Take 1 tablet (50 mg total) by mouth 2 (two) times daily. 02/01/19  Yes [place], Eric J, DO  metroNIDAZOLE (FLAGYL) 500 MG tablet Take 1 tablet (500 mg total) by mouth 3 (three) times daily for 21 days. 02/01/19 02/22/19 Yes [place], Eric J, DO  polyethylene glycol (MIRALAX / GLYCOLAX) packet Take 17 g by mouth daily for 14 days. 02/10/19 02/24/19 Yes [place], Adam, DO  rivaroxaban (XARELTO) 20 MG TABS tablet Take 1 tablet (20 mg total) by mouth daily with supper. 02/01/19  Yes [place], Alvira Philips, DO  UNABLE TO FIND Med Name:  Immune protect supplement   Yes [provider]  UNABLE TO FIND Med  Name: Cardio BP supplement   Yes [provider]  UNABLE TO FIND Med Name: Probiotic Complete Supplement   Yes [provider]  traMADol (ULTRAM) 50 MG tablet Take 1 tablet (50 mg total) by mouth every 12 (twelve) hours as needed for severe pain. Patient not taking: Reported on 02/10/2019 08/16/15   Tomasita Crumble, MD    Physical Exam: Vitals:   02/18/19 1630 02/18/19 1700 02/18/19 1715 02/18/19 1744  BP: (!) 143/89 (!) 150/79 (!) 150/82 (!) 146/88  Pulse: 80 85 87 86  Resp: (!) 21 17 14 16   Temp:    98.3 F (36.8 C)  TempSrc:    Oral  SpO2: 94% 95% 91% 94%      Constitutional: NAD, calm, comfortable Vitals:   02/18/19 1630 02/18/19 1700 02/18/19 1715 02/18/19 1744  BP: (!) 143/89 (!) 150/79 (!) 150/82 (!) 146/88  Pulse: 80 85 87 86  Resp: (!) 21 17 14 16   Temp:    98.3 F (36.8 C)  TempSrc:    Oral  SpO2: 94% 95% 91% 94%   Eyes: PERRL, lids and conjunctivae normal ENMT: Mucous membranes are moist. Posterior pharynx clear of any exudate or lesions.Normal dentition.  Neck: normal, supple, no masses, no thyromegaly Respiratory: clear to auscultation bilaterally, no wheezing, no crackles. Normal respiratory effort. No accessory muscle use.  Cardiovascular: Regular rate and rhythm, no murmurs / rubs / gallops. No extremity edema. 2+ pedal pulses. No carotid bruits.  Abdomen: no tenderness, no masses palpated. No hepatosplenomegaly. Bowel sounds positive.  Musculoskeletal: no clubbing / cyanosis. No joint deformity upper and lower extremities. Good ROM, no contractures. Normal muscle tone.  Skin: no rashes, lesions, ulcers. No induration Neurologic: Left facial droop with no ptosis, sensation intact, DTR normal. Strength 4/5 in left upper and lower extremity, 5 out of 5 in the right upper and lower extremity.  Normal speech Psychiatric: Normal judgment and insight. Alert and oriented x 3. Normal mood.     Labs on Admission: I have personally reviewed following labs  and imaging studies  CBC: Recent Labs  Lab 02/18/19 1650  HGB 13.3  HCT 39.0   Basic Metabolic Panel: Recent Labs  Lab 02/18/19 1605 02/18/19 1650  NA 137 138  K 6.0* 4.6  CL 109  --   CO2 21*  --   GLUCOSE 117*  --   BUN 12  --   CREATININE 0.75 0.70  CALCIUM 8.3*  --    GFR: Estimated Creatinine Clearance: 73.5 mL/min (by C-G formula based on SCr of 0.7 mg/dL). Liver Function Tests: Recent Labs  Lab 02/18/19 1605  AST 76*  ALT 11  ALKPHOS 73  BILITOT 1.9*  PROT 6.0*  ALBUMIN 3.1*   No results for input(s): LIPASE, AMYLASE in the last 168  hours. No results for input(s): AMMONIA in the last 168 hours. Coagulation Profile: Recent Labs  Lab 02/18/19 1605  INR 1.1   Cardiac Enzymes: No results for input(s): CKTOTAL, CKMB, CKMBINDEX, TROPONINI in the last 168 hours. BNP (last 3 results) No results for input(s): PROBNP in the last 8760 hours. HbA1C: No results for input(s): HGBA1C in the last 72 hours. CBG: Recent Labs  Lab 02/18/19 1719  GLUCAP 103*   Lipid Profile: No results for input(s): CHOL, HDL, LDLCALC, TRIG, CHOLHDL, LDLDIRECT in the last 72 hours. Thyroid Function Tests: No results for input(s): TSH, T4TOTAL, FREET4, T3FREE, THYROIDAB in the last 72 hours. Anemia Panel: No results for input(s): VITAMINB12, FOLATE, FERRITIN, TIBC, IRON, RETICCTPCT in the last 72 hours. Urine analysis:    Component Value Date/Time   COLORURINE YELLOW 02/18/2019 1640   APPEARANCEUR CLEAR 02/18/2019 1640   LABSPEC 1.015 02/18/2019 1640   PHURINE 5.0 02/18/2019 1640   GLUCOSEU NEGATIVE 02/18/2019 1640   HGBUR NEGATIVE 02/18/2019 1640   BILIRUBINUR NEGATIVE 02/18/2019 1640   KETONESUR NEGATIVE 02/18/2019 1640   PROTEINUR NEGATIVE 02/18/2019 1640   UROBILINOGEN 0.2 08/15/2015 2223   NITRITE NEGATIVE 02/18/2019 1640   LEUKOCYTESUR NEGATIVE 02/18/2019 1640   Sepsis Labs: @LABRCNTIP (procalcitonin:4,lacticidven:4) )No results found for this or any previous  visit (from the past 240 hour(s)).   Radiological Exams on Admission: Ct Head Code Stroke Wo Contrast  Result Date: 02/18/2019 CLINICAL DATA:  Code stroke. Patient awoke at 12:45 today with slurred speech and LEFT-sided numbness. EXAM: CT HEAD WITHOUT CONTRAST TECHNIQUE: Contiguous axial images were obtained from the base of the skull through the vertex without intravenous contrast. COMPARISON:  None. FINDINGS: Brain: No evidence of acute infarction, hemorrhage, hydrocephalus, extra-axial collection or mass lesion/mass effect. Normal for age cerebral volume and white matter appearance. Vascular: Calcification of the cavernous internal carotid arteries consistent with cerebrovascular atherosclerotic disease. No signs of intracranial large vessel occlusion. Skull: Intact. Sinuses/Orbits: Negative. Other: None. ASPECTS [facility] Stroke Program Early CT Score) - Ganglionic level infarction (caudate, lentiform nuclei, internal capsule, insula, M1-M3 cortex): 7 - Supraganglionic infarction (M4-M6 cortex): 3 Total score (0-10 with 10 being normal): 10 IMPRESSION: 1. Negative exam. 2. ASPECTS is 10. * These results were communicated to Dr. Otelia Limes at 4:30 pmon 3/12/2020by text page via the [facility] messaging system. Electronically Signed   By: Elsie Stain M.D.   On: 02/18/2019 16:31    EKG: Independently reviewed.  Sinus rhythm with a rate of 89, right axis deviation, nonspecific ST changes, low voltage EKG  Assessment/Plan Principal Problem:   Acute CVA (cerebrovascular accident) (HCC) Active Problems:   Hypothyroid   Atrial fibrillation with RVR (HCC)   H/O: GI bleed     #1 cerebrovascular accident: Suspected TIA.  MRI at this point is pending.  I suspect patient's paroxysmal atrial fibrillation may be a case.  Initiate aspirin and statin.  If no evidence of's acute infarct we may at Plavix for 3 weeks.  Patient's recent GI bleed however will need to be reevaluated.  She has not been referred to GI yet.   If the source of her recent bleed is established and resolve she may need to go back to her Xarelto.  In the meantime will await MRI as well as neurology input.  We will get physical therapy And Occupational Therapy and speech therapy evaluation.  #2 hypertension: Continue with blood pressure control especially if MRI is negative.  #3 atrial fibrillation: Rate at this point is controlled.  Patient has  been off Xarelto as above.  Again she will need GI work-up to see if she needs to go back on Xarelto.  #4 hypothyroidism: Continue with levothyroxine.  #5 history of GI bleed: Patient will be on aspirin but no Plavix or other anticoagulation at this point.  #6 hyperkalemia: Transient.  Potassium now normal   DVT prophylaxis: SCD Code Status: Full code Family Communication: No family at bedside Disposition Plan: Home Consults called: Neurology Dr. Petra Kuba Admission status: Inpatient  Severity of Illness: The appropriate patient status for this patient is INPATIENT. Inpatient status is judged to be reasonable and necessary in order to provide the required intensity of service to ensure the patient's safety. The patient's presenting symptoms, physical exam findings, and initial radiographic and laboratory data in the context of their chronic comorbidities is felt to place them at high risk for further clinical deterioration. Furthermore, it is not anticipated that the patient will be medically stable for discharge from the hospital within 2 midnights of admission. The following factors support the patient status of inpatient.   " The patient's presenting symptoms include left-sided weakness and facial weakness. " The worrisome physical exam findings include weakness on the left upper and lower extremity. " The initial radiographic and laboratory data are worrisome because of no CT evidence of CVA. " The chronic co-morbidities include hypertension hypothyroidism.   * I certify that at the  point of admission it is my clinical judgment that the patient will require inpatient hospital care spanning beyond 2 midnights from the point of admission due to high intensity of service, high risk for further deterioration and high frequency of surveillance required.Lonia Blood MD [facility] Pager [phone]  If 7PM-7AM, please contact night-coverage [email or web address] Password [facility]  02/18/2019, 6:37 PM

## 2019-02-18 NOTE — ED Notes (Signed)
Patient transported to MRI 

## 2019-02-18 NOTE — ED Notes (Signed)
Pt returned to room from CT

## 2019-02-19 ENCOUNTER — Observation Stay (HOSPITAL_COMMUNITY): Payer: Medicare Other

## 2019-02-19 DIAGNOSIS — I639 Cerebral infarction, unspecified: Secondary | ICD-10-CM | POA: Diagnosis not present

## 2019-02-19 DIAGNOSIS — I4891 Unspecified atrial fibrillation: Secondary | ICD-10-CM | POA: Diagnosis not present

## 2019-02-19 DIAGNOSIS — E1169 Type 2 diabetes mellitus with other specified complication: Secondary | ICD-10-CM

## 2019-02-19 LAB — LIPID PANEL
CHOLESTEROL: 258 mg/dL — AB (ref 0–200)
HDL: 59 mg/dL (ref >40)
LDL Cholesterol: 183 mg/dL — ABNORMAL HIGH (ref 0–99)
Total CHOL/HDL Ratio: 4.4 RATIO
Triglycerides: 81 mg/dL (ref <150)
VLDL: 16 mg/dL (ref 0–40)

## 2019-02-19 LAB — COMPREHENSIVE METABOLIC PANEL
ALK PHOS: 70 U/L (ref 38–126)
ALT: 21 U/L (ref 0–44)
AST: 24 U/L (ref 15–41)
Albumin: 3.2 g/dL — ABNORMAL LOW (ref 3.5–5.0)
Anion gap: 9 (ref 5–15)
BUN: 11 mg/dL (ref 8–23)
CO2: 25 mmol/L (ref 22–32)
Calcium: 8.9 mg/dL (ref 8.9–10.3)
Chloride: 102 mmol/L (ref 98–111)
Creatinine, Ser: 0.88 mg/dL (ref 0.44–1.00)
GFR calc Af Amer: >60 mL/min (ref >60)
GFR calc non Af Amer: >60 mL/min (ref >60)
Glucose, Bld: 112 mg/dL — ABNORMAL HIGH (ref 70–99)
Potassium: 4.2 mmol/L (ref 3.5–5.1)
Sodium: 136 mmol/L (ref 135–145)
Total Bilirubin: 0.8 mg/dL (ref 0.3–1.2)
Total Protein: 6.6 g/dL (ref 6.5–8.1)

## 2019-02-19 LAB — CBC
HCT: 40.1 % (ref 36.0–46.0)
Hemoglobin: 13 g/dL (ref 12.0–15.0)
MCH: 29.3 pg (ref 26.0–34.0)
MCHC: 32.4 g/dL (ref 30.0–36.0)
MCV: 90.3 fL (ref 80.0–100.0)
NRBC: 0 % (ref 0.0–0.2)
Platelets: 206 10*3/uL (ref 150–400)
RBC: 4.44 MIL/uL (ref 3.87–5.11)
RDW: 14.6 % (ref 11.5–15.5)
WBC: 6.2 10*3/uL (ref 4.0–10.5)

## 2019-02-19 LAB — HEMOGLOBIN A1C
Hgb A1c MFr Bld: 7 % — ABNORMAL HIGH (ref 4.8–5.6)
Mean Plasma Glucose: 154.2 mg/dL

## 2019-02-19 MED ORDER — APIXABAN 5 MG PO TABS
5.0000 mg | ORAL_TABLET | Freq: Two times a day (BID) | ORAL | Status: DC
  Administered 2019-02-19: 5 mg via ORAL
  Filled 2019-02-19: qty 1

## 2019-02-19 MED ORDER — OXYCODONE HCL 5 MG PO TABS
5.0000 mg | ORAL_TABLET | Freq: Four times a day (QID) | ORAL | Status: DC | PRN
  Administered 2019-02-19: 5 mg via ORAL
  Filled 2019-02-19: qty 1

## 2019-02-19 MED ORDER — METFORMIN HCL 500 MG PO TABS: 1000.0000 mg | ORAL_TABLET | Freq: Two times a day (BID) | ORAL | 0 refills | Status: DC

## 2019-02-19 MED ORDER — BLOOD GLUCOSE MONITOR KIT: PACK | 0 refills | Status: DC

## 2019-02-19 MED ORDER — VALACYCLOVIR HCL 500 MG PO TABS
1000.0000 mg | ORAL_TABLET | Freq: Three times a day (TID) | ORAL | Status: DC
  Administered 2019-02-19: 1000 mg via ORAL
  Filled 2019-02-19 (×2): qty 2

## 2019-02-19 MED ORDER — LEVOFLOXACIN 500 MG PO TABS
750.0000 mg | ORAL_TABLET | Freq: Every day | ORAL | Status: DC
  Administered 2019-02-19: 750 mg via ORAL
  Filled 2019-02-19: qty 2

## 2019-02-19 MED ORDER — ATORVASTATIN CALCIUM 40 MG PO TABS: 40.0000 mg | ORAL_TABLET | Freq: Every day | ORAL | 0 refills | Status: DC

## 2019-02-19 MED ORDER — ARTIFICIAL TEARS OPHTHALMIC OINT: TOPICAL_OINTMENT | Freq: Every evening | OPHTHALMIC | 0 refills | Status: DC | PRN

## 2019-02-19 MED ORDER — POLYVINYL ALCOHOL 1.4 % OP SOLN: 1.0000 [drp] | OPHTHALMIC | 0 refills | Status: DC | PRN

## 2019-02-19 MED ORDER — PREDNISONE 20 MG PO TABS: 60.0000 mg | ORAL_TABLET | Freq: Every day | ORAL | Status: DC

## 2019-02-19 MED ORDER — PREDNISONE 20 MG PO TABS: 60.0000 mg | ORAL_TABLET | Freq: Every day | ORAL | 0 refills | Status: AC

## 2019-02-19 MED ORDER — APIXABAN 5 MG PO TABS: 5.0000 mg | ORAL_TABLET | Freq: Two times a day (BID) | ORAL | 0 refills | Status: DC

## 2019-02-19 MED ORDER — VALACYCLOVIR HCL 1 G PO TABS: 1000.0000 mg | ORAL_TABLET | Freq: Three times a day (TID) | ORAL | 0 refills | Status: AC

## 2019-02-19 MED ORDER — POLYVINYL ALCOHOL 1.4 % OP SOLN
1.0000 [drp] | OPHTHALMIC | Status: DC | PRN
  Filled 2019-02-19: qty 15

## 2019-02-19 NOTE — Discharge Summary (Signed)
Physician Discharge Summary  Abigail Ryan SNK:[id number] DOB: 1946/07/04 DOA: 02/18/2019  PCP: Abigail Downing, MD  Admit date: 02/18/2019 Discharge date: 02/19/2019  Time spent: 45 minutes  Recommendations for Outpatient Follow-up:  1. Follow up outpatient CBC/CMP 2. Follow up with neurology for bell's palsy 3. Follow up with PCP for diabetes and hyperlipidemia.  Follow blood sugars and how pt doing on metformin. 4. Needs GI follow up.  We've resumed eliquis for atrial fibrillation (she'll need follow up to ensure she's able to continue to afford this and is able to get pharmacy assistance - vs eventual transition to warfarin).  She's never had colonoscopy and had rectal bleeding about Abigail Ryan week ago.  Needs colonoscopy. 5. Needs neurology follow up for bilateral hand numbness and EMG/NCS.  Follow B12 as outpatient.  Discharge Diagnoses:  Principal Problem:   Acute CVA (cerebrovascular accident) Abigail Ryan) Active Problems:   Hypothyroid   Atrial fibrillation with RVR (Abigail Ryan)   H/O: GI bleed   Discharge Condition: stable  Diet recommendation: heart healthy  There were no vitals filed for this visit.  History of present illness:  Abigail Ryan is Abigail Ryan 73 y.o. female with medical history significant of atrial fibrillation, hypertension, and hypothyroidism who has been on Xarelto but had rectal bleed Abigail Ryan week ago.  Patient Xarelto was discontinued.  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  She went to bed this morning around 10:00 and woke up around 1 PM when she noticed left facial weakness.  When she tried to eat Abigail Ryan sandwich she could not bite it.  Patient was able to walk around the house but felt numb in her face.  She came to the ER where she was noted to have left arm and left lower extremity weakness as well.  Initial code stroke was called but she was outside the window for any TPA.  Subsequent head CT was negative.  Patient is being admitted with possible CVA.   Work-up to continue.  Neurology consulted.Marland Kitchen  She was seen for concern for stroke.  MRI was negative and she was evaluated by neurology on hospital day 1 who felt symptoms were consistent with bell's palsy.  Started on prednisone and valtrex x 7 days.  She'll need outpatient neurology follow up.  She has new diagnosis of diabetes.  Started on metformin.  Also started on lipitor.   Hospital Course:  #1 Bell's Palsy: L sided bell's palsy.  Started on valtrex/prednisone.  Eye drops during the day and lubricant at night.  She'll need outpatient neurology follow up.  MRI negative for stroke.  CTA head/neck without significant stenosis  No need for echo with dx c/w bell's (recent echo in 01/2019 normal EF - see report)  # Bilateral Hand Paresthesias: MRI c spine with mild spinal stenosis.  Moderate L C5-6 foraminal stenosis.  Needs outpatient follow up with neurology for nerve conduction studies.  Normal TSH.  Consider B12 as outpatient.  # Atrial Fibrillation: chadsvasc is at least 4.  She prefers eliquis.  Discussed resumption of eliquis in setting of recent GI bleed.  Discussed risks/benefits.  She prefers to restart.  Discussed precautions to stop if recurrent bleeding.  Will need outpatient follow up with PCP to ensure she'll be able to afford this long term.  # Hx GI bleed: apparently was told that she had hemorrhoids in ED and this was likely source of bleeding, but she's never had colonoscopy before and needs this.  Discussed importance of GI  follow up.  #2 hypertension: resume home BP meds  # T2DM: A1c 7.  Started on metformin.  Diabetes educator.  Follow outpatient.  # Hyperlipidemia: started on lipitor, follow outpatient  #4 hypothyroidism: Continue with levothyroxine.  # History of hepatic abscess: continue course of abx, needs follow up imaging  #6 hyperkalemia: Transient.  Potassium now normal  Procedures:  none   Consultations:  neurology  Discharge Exam: Vitals:    02/19/19 0818 02/19/19 1158  BP: 120/77 135/64  Pulse: 79 61  Resp: 20 20  Temp: 98.5 F (36.9 C) 98.3 F (36.8 C)  SpO2: 97% 97%   Feeling well. Still with L facial droop.  General: No acute distress. Cardiovascular: Heart sounds show Abigail Ryan regular rate, and rhythm. No gallops or rubs. No murmurs. No JVD. Lungs: Clear to auscultation bilaterally with good air movement. No rales, rhonchi or wheezes. Abdomen: Soft, nontender, nondistended with normal active bowel sounds. No masses. No hepatosplenomegaly. Neurological: Alert and oriented 3. 5/5 strength to upper and lower extremities. L sided facial droop without forehead sparing.   Skin: Warm and dry. No rashes or lesions. Extremities: No clubbing or cyanosis. No edema. Pedal pulses 2+. Psychiatric: Mood and affect are normal. Insight and judgment are appropriate.  Discharge Instructions   Discharge Instructions    Ambulatory referral to Neurology   Complete by:  As directed    Follow up with any provider for bell's palsy and b/l hand numbness in 2 weeks. Not for stroke. Thanks.   Call MD for:  difficulty breathing, headache or visual disturbances   Complete by:  As directed    Call MD for:  extreme fatigue   Complete by:  As directed    Call MD for:  hives   Complete by:  As directed    Call MD for:  persistant dizziness or light-headedness   Complete by:  As directed    Call MD for:  persistant nausea and vomiting   Complete by:  As directed    Call MD for:  redness, tenderness, or signs of infection (pain, swelling, redness, odor or green/yellow discharge around incision site)   Complete by:  As directed    Call MD for:  severe uncontrolled pain   Complete by:  As directed    Call MD for:  temperature >100.4   Complete by:  As directed    Diet - low sodium heart healthy   Complete by:  As directed    Discharge instructions   Complete by:  As directed    You were seen for bells palsy.  We've started you on prednisone  for 7 days with valtrex.  Please follow up with neurology after discharge.  We're planning to restart your blood thinner.  We're going to start eliquis instead of xarelto.  Please follow up with Jolisa Intriago gastroenterologist as an outpatient.  It's extremely important that you follow up with Tnia Anglada GI doctor for further evaluation.  Stop your eliquis if you have recurrent bleeding.    For the numbness in your fingers, you'll need to follow up with neurology for additional testing.    Continue your antibiotics for your hepatic abscess.  You'll need repeat imaging after discharge.  You have diabetes.  We've started you on metformin.  Uptitrate the dose slowly.  Start with 500 mg daily with breakfast for 3 days.  If you tolerate this well, increase to 500 mg with breakfast and dinner for another 3 days.  If you tolerate this well,  increase to 1000 mg in the morning (2 pills) and 500 mg at dinner.  If this goes well, increase to the target dose of 1000 mg (two pills) at breakfast and 1000 mg at dinner.  Check your blood sugar once daily and bring this list to your PCP.  Return for new, recurrent, or worsening symptoms.  Please ask your PCP to request records from this hospitalization so they know what was done and what the next steps will be.   Increase activity slowly   Complete by:  As directed      Allergies as of 02/19/2019      Reactions   Codeine Nausea Only, Other (See Comments)   Extreme headaches also   Morphine And Related Other (See Comments)   Extreme headaches      Medication List    STOP taking these medications   ASPIRIN PO   ibuprofen 200 MG tablet Commonly known as:  ADVIL,MOTRIN   rivaroxaban 20 MG Tabs tablet Commonly known as:  XARELTO     TAKE these medications   acetaminophen 500 MG tablet Commonly known as:  TYLENOL Take 1,000 mg by mouth every 6 (six) hours as needed for moderate pain or fever.   apixaban 5 MG Tabs tablet Commonly known as:  ELIQUIS Take 1 tablet (5  mg total) by mouth 2 (two) times daily for 30 days.   atorvastatin 40 MG tablet Commonly known as:  LIPITOR Take 1 tablet (40 mg total) by mouth daily at 6 PM for 30 days.   benzonatate 200 MG capsule Commonly known as:  TESSALON Take 200 mg by mouth 3 (three) times daily as needed for cough.   blood glucose meter kit and supplies Kit Dispense based on patient and insurance preference. Use up to four times daily as directed. (FOR ICD-9 250.00, 250.01).   cholecalciferol 1000 units tablet Commonly known as:  VITAMIN D Take 1,500 Units by mouth daily.   diltiazem 180 MG 24 hr capsule Commonly known as:  CARDIZEM CD Take 1 capsule (180 mg total) by mouth daily.   levofloxacin 750 MG tablet Commonly known as:  Levaquin Take 1 tablet (750 mg total) by mouth daily for 21 days.   levothyroxine 150 MCG tablet Commonly known as:  SYNTHROID, LEVOTHROID Take 150 mcg by mouth at bedtime.   metFORMIN 500 MG tablet Commonly known as:  Glucophage Take 2 tablets (1,000 mg total) by mouth 2 (two) times daily with Marvelyn Bouchillon meal for 30 days. (uptitrate per discharge instructions)   metoprolol tartrate 50 MG tablet Commonly known as:  LOPRESSOR Take 1 tablet (50 mg total) by mouth 2 (two) times daily.   metroNIDAZOLE 500 MG tablet Commonly known as:  Flagyl Take 1 tablet (500 mg total) by mouth 3 (three) times daily for 21 days.   polyethylene glycol packet Commonly known as:  MIRALAX / GLYCOLAX Take 17 g by mouth daily for 14 days.   polyvinyl alcohol 1.4 % ophthalmic solution Commonly known as:  LIQUIFILM TEARS Place 1 drop into the left eye as needed for dry eyes.   predniSONE 20 MG tablet Commonly known as:  DELTASONE Take 3 tablets (60 mg total) by mouth daily with breakfast for 6 days. Start taking on:  February 20, 2019   traMADol 50 MG tablet Commonly known as:  ULTRAM Take 1 tablet (50 mg total) by mouth every 12 (twelve) hours as needed for severe pain.   UNABLE TO FIND Med  Name:  Immune protect supplement  UNABLE TO FIND Med Name: Cardio BP supplement   UNABLE TO FIND Med Name: Probiotic Complete Supplement   valACYclovir 1000 MG tablet Commonly known as:  VALTREX Take 1 tablet (1,000 mg total) by mouth 3 (three) times daily for 7 days.      Allergies  Allergen Reactions  . Codeine Nausea Only and Other (See Comments)    Extreme headaches also  . Morphine And Related Other (See Comments)    Extreme headaches   Follow-up Information    [facility]. Schedule an appointment as soon as possible for Brandin Stetzer visit in 2 week(s).   Specialty:  Neurology Contact information: [address] [place] [phone]           The results of significant diagnostics from this hospitalization (including imaging, microbiology, ancillary and laboratory) are listed below for reference.    Significant Diagnostic Studies: Ct Angio Head W Or Wo Contrast  Result Date: 02/18/2019 CLINICAL DATA:  Stroke follow-up. EXAM: CT ANGIOGRAPHY HEAD AND NECK TECHNIQUE: Multidetector CT imaging of the head and neck was performed using the standard protocol during bolus administration of intravenous contrast. Multiplanar CT image reconstructions and MIPs were obtained to evaluate the vascular anatomy. Carotid stenosis measurements (when applicable) are obtained utilizing NASCET criteria, using the distal internal carotid diameter as the denominator. CONTRAST:  62m OMNIPAQUE IOHEXOL 350 MG/ML SOLN COMPARISON:  CT head without contrast 4:18 p.m. FINDINGS: CTA NECK FINDINGS Aortic arch: Jaimen Melone 4 vessel arch configuration is present. The left vertebral artery originates directly from the aortic arch. There is no significant atherosclerotic calcification or stenosis at the arch. Right carotid system: Right common carotid artery is mildly tortuous. The bifurcation is unremarkable. The cervical right ICA is within normal limits. Left carotid  system: The left common carotid artery demonstrates mild proximal tortuosity without significant stenosis. There is atherosclerotic calcification at the bifurcation. There is no significant stenosis. The cervical left ICA is normal. Vertebral arteries: Right vertebral artery is the dominant vessel. It originates from the right subclavian artery. Audrianna Driskill hypoplastic left vertebral artery originates directly from the arch. There is no significant stenosis involving either vessel. Skeleton: Vertebral body heights and alignment are maintained. No focal lytic or blastic lesions are present. Other neck: The soft tissues the neck are otherwise unremarkable. Upper chest: No focal mucosal or submucosal lesions are present. Salivary glands are within normal limits. Vocal cords are midline and symmetric. Thyroid is unremarkable. No significant adenopathy is present. Review of the MIP images confirms the above findings CTA HEAD FINDINGS Anterior circulation: Internal carotid arteries demonstrate mild atherosclerotic changes within the cavernous segments bilaterally. There is no significant stenosis relative to the ICA termini. The A1 and M1 segments are normal. The right A1 segment is dominant. The anterior communicating artery is patent. MCA bifurcations are within normal limits bilaterally. The ACA and MCA branch vessels are within normal limits. Posterior circulation: The right vertebral artery is the dominant vessel. PICA origins are visualized and within normal limits bilaterally. Vertebrobasilar junction is normal. The basilar artery is small. Prominent posterior communicating arteries are present bilaterally. There is Siyona Coto small P1 segment on the right. The posterior cerebral arteries are within normal limits bilaterally. Venous sinuses: The dural sinuses are patent. The straight sinus deep cerebral veins are intact. Cortical veins are within normal limits. Anatomic variants: Fetal type posterior cerebral arteries bilaterally.  There is Nile Dorning small P1 segment on the right. Delayed phase: Delayed images demonstrate no pathologic enhancement. Review  of the MIP images confirms the above findings IMPRESSION: 1. Mild atherosclerotic changes at the left carotid bifurcation and cavernous internal carotid arteries bilaterally without significant stenosis. 2. Mild tortuosity of the neck without significant stenosis. This can be seen in the setting of hypertension. 3. Normal CTA circle-of-Willis without significant proximal stenosis, aneurysm, or branch vessel occlusion. Electronically Signed   By: San Morelle M.D.   On: 02/18/2019 18:51   Dg Chest 2 View  Result Date: 01/25/2019 CLINICAL DATA:  Cough and headache EXAM: CHEST - 2 VIEW COMPARISON:  02/23/2018 FINDINGS: The heart size and mediastinal contours are within normal limits. Both lungs are clear. The visualized skeletal structures are unremarkable. IMPRESSION: No active cardiopulmonary disease. Electronically Signed   By: Ulyses Jarred M.D.   On: 01/25/2019 15:15   Ct Angio Neck W Or Wo Contrast  Result Date: 02/18/2019 CLINICAL DATA:  Stroke follow-up. EXAM: CT ANGIOGRAPHY HEAD AND NECK TECHNIQUE: Multidetector CT imaging of the head and neck was performed using the standard protocol during bolus administration of intravenous contrast. Multiplanar CT image reconstructions and MIPs were obtained to evaluate the vascular anatomy. Carotid stenosis measurements (when applicable) are obtained utilizing NASCET criteria, using the distal internal carotid diameter as the denominator. CONTRAST:  58m OMNIPAQUE IOHEXOL 350 MG/ML SOLN COMPARISON:  CT head without contrast 4:18 p.m. FINDINGS: CTA NECK FINDINGS Aortic arch: Indea Dearman 4 vessel arch configuration is present. The left vertebral artery originates directly from the aortic arch. There is no significant atherosclerotic calcification or stenosis at the arch. Right carotid system: Right common carotid artery is mildly tortuous. The  bifurcation is unremarkable. The cervical right ICA is within normal limits. Left carotid system: The left common carotid artery demonstrates mild proximal tortuosity without significant stenosis. There is atherosclerotic calcification at the bifurcation. There is no significant stenosis. The cervical left ICA is normal. Vertebral arteries: Right vertebral artery is the dominant vessel. It originates from the right subclavian artery. Hawthorne Day hypoplastic left vertebral artery originates directly from the arch. There is no significant stenosis involving either vessel. Skeleton: Vertebral body heights and alignment are maintained. No focal lytic or blastic lesions are present. Other neck: The soft tissues the neck are otherwise unremarkable. Upper chest: No focal mucosal or submucosal lesions are present. Salivary glands are within normal limits. Vocal cords are midline and symmetric. Thyroid is unremarkable. No significant adenopathy is present. Review of the MIP images confirms the above findings CTA HEAD FINDINGS Anterior circulation: Internal carotid arteries demonstrate mild atherosclerotic changes within the cavernous segments bilaterally. There is no significant stenosis relative to the ICA termini. The A1 and M1 segments are normal. The right A1 segment is dominant. The anterior communicating artery is patent. MCA bifurcations are within normal limits bilaterally. The ACA and MCA branch vessels are within normal limits. Posterior circulation: The right vertebral artery is the dominant vessel. PICA origins are visualized and within normal limits bilaterally. Vertebrobasilar junction is normal. The basilar artery is small. Prominent posterior communicating arteries are present bilaterally. There is Destanee Bedonie small P1 segment on the right. The posterior cerebral arteries are within normal limits bilaterally. Venous sinuses: The dural sinuses are patent. The straight sinus deep cerebral veins are intact. Cortical veins are  within normal limits. Anatomic variants: Fetal type posterior cerebral arteries bilaterally. There is Wafaa Deemer small P1 segment on the right. Delayed phase: Delayed images demonstrate no pathologic enhancement. Review of the MIP images confirms the above findings IMPRESSION: 1. Mild atherosclerotic changes at the left carotid bifurcation  and cavernous internal carotid arteries bilaterally without significant stenosis. 2. Mild tortuosity of the neck without significant stenosis. This can be seen in the setting of hypertension. 3. Normal CTA circle-of-Willis without significant proximal stenosis, aneurysm, or branch vessel occlusion. Electronically Signed   By: San Morelle M.D.   On: 02/18/2019 18:51   Mr Brain Wo Contrast  Result Date: 02/18/2019 CLINICAL DATA:  73 year old female with new onset left face and leg weakness. Slurred speech, left facial droop and numbness since 1000 hours. EXAM: MRI HEAD WITHOUT CONTRAST MRI CERVICAL SPINE WITHOUT CONTRAST TECHNIQUE: Multiplanar, multiecho pulse sequences of the brain and surrounding structures, and cervical spine, to include the craniocervical junction and cervicothoracic junction, were obtained without intravenous contrast. COMPARISON:  CT head and CTA head and neck earlier today. FINDINGS: MRI HEAD FINDINGS Brain: No restricted diffusion to suggest acute infarction. No midline shift, mass effect, evidence of mass lesion, ventriculomegaly, extra-axial collection or acute intracranial hemorrhage. Cervicomedullary junction and pituitary are within normal limits. Normal cerebral volume. Pearline Cables and white matter signal is within normal limits for age throughout the brain. No cortical encephalomalacia or chronic cerebral blood products. Vascular: Major intracranial vascular flow voids are preserved. Dominant right vertebral artery redemonstrated. Skull and upper cervical spine: Mild hyperostosis of the calvarium. Visualized bone marrow signal is within normal limits.  Sinuses/Orbits: Negative orbits. Paranasal Visualized paranasal sinuses and mastoids are stable and well pneumatized. Other: Grossly normal visible internal auditory structures. Scalp and face soft tissues appear negative. MRI CERVICAL SPINE FINDINGS Alignment: Mild straightening of cervical lordosis stable from the CTA earlier today. Subtle degenerative appearing anterolisthesis of C5 on C6. Vertebrae: No marrow edema or evidence of acute osseous abnormality. Visualized bone marrow signal is within normal limits. Cord: Spinal cord signal is within normal limits at all visualized levels. Posterior Fossa, vertebral arteries, paraspinal tissues: Cervicomedullary junction is within normal limits. Preserved major vascular flow voids in the neck, the right vertebral artery is dominant. Negative neck soft tissues and visible right lung apex. Disc levels: C2-C3:  Negative. C3-C4: Mild endplate spurring and facet hypertrophy. Borderline to mild left C4 foraminal stenosis. C4-C5: Moderate facet hypertrophy on the left. Mild disc bulge and endplate spurring. Borderline to mild spinal stenosis. Moderate left C5 foraminal stenosis. C5-C6: Mild anterolisthesis. Moderate to severe left and mild right facet hypertrophy. Ligament flavum hypertrophy. Circumferential disc bulge with endplate spurring. Mild spinal stenosis. No cord mass effect. Mild to moderate left C6 foraminal stenosis. C6-C7: Disc space loss with circumferential disc bulge and endplate spurring. No spinal stenosis. Mild bilateral C7 foraminal stenosis. C7-T1:  Mild to moderate facet hypertrophy.  No stenosis. Mild upper thoracic disc disease, no upper thoracic spinal stenosis. IMPRESSION: 1. No acute intracranial abnormality and normal for age noncontrast MRI appearance of the brain. 2. 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 No cervical spinal cord mass effect or signal abnormality. Up to moderate left C5 and C6 foraminal stenosis.  Electronically Signed   By: Genevie Ann M.D.   On: 02/18/2019 19:47   Mr Cervical Spine Wo Contrast  Result Date: 02/18/2019 CLINICAL DATA:  73 year old female with new onset left face and leg weakness. Slurred speech, left facial droop and numbness since 1000 hours. EXAM: MRI HEAD WITHOUT CONTRAST MRI CERVICAL SPINE WITHOUT CONTRAST TECHNIQUE: Multiplanar, multiecho pulse sequences of the brain and surrounding structures, and cervical spine, to include the craniocervical junction and cervicothoracic junction, were obtained without intravenous contrast. COMPARISON:  CT head and CTA head and neck  earlier today. FINDINGS: MRI HEAD FINDINGS Brain: No restricted diffusion to suggest acute infarction. No midline shift, mass effect, evidence of mass lesion, ventriculomegaly, extra-axial collection or acute intracranial hemorrhage. Cervicomedullary junction and pituitary are within normal limits. Normal cerebral volume. Pearline Cables and white matter signal is within normal limits for age throughout the brain. No cortical encephalomalacia or chronic cerebral blood products. Vascular: Major intracranial vascular flow voids are preserved. Dominant right vertebral artery redemonstrated. Skull and upper cervical spine: Mild hyperostosis of the calvarium. Visualized bone marrow signal is within normal limits. Sinuses/Orbits: Negative orbits. Paranasal Visualized paranasal sinuses and mastoids are stable and well pneumatized. Other: Grossly normal visible internal auditory structures. Scalp and face soft tissues appear negative. MRI CERVICAL SPINE FINDINGS Alignment: Mild straightening of cervical lordosis stable from the CTA earlier today. Subtle degenerative appearing anterolisthesis of C5 on C6. Vertebrae: No marrow edema or evidence of acute osseous abnormality. Visualized bone marrow signal is within normal limits. Cord: Spinal cord signal is within normal limits at all visualized levels. Posterior Fossa, vertebral arteries,  paraspinal tissues: Cervicomedullary junction is within normal limits. Preserved major vascular flow voids in the neck, the right vertebral artery is dominant. Negative neck soft tissues and visible right lung apex. Disc levels: C2-C3:  Negative. C3-C4: Mild endplate spurring and facet hypertrophy. Borderline to mild left C4 foraminal stenosis. C4-C5: Moderate facet hypertrophy on the left. Mild disc bulge and endplate spurring. Borderline to mild spinal stenosis. Moderate left C5 foraminal stenosis. C5-C6: Mild anterolisthesis. Moderate to severe left and mild right facet hypertrophy. Ligament flavum hypertrophy. Circumferential disc bulge with endplate spurring. Mild spinal stenosis. No cord mass effect. Mild to moderate left C6 foraminal stenosis. C6-C7: Disc space loss with circumferential disc bulge and endplate spurring. No spinal stenosis. Mild bilateral C7 foraminal stenosis. C7-T1:  Mild to moderate facet hypertrophy.  No stenosis. Mild upper thoracic disc disease, no upper thoracic spinal stenosis. IMPRESSION: 1. No acute intracranial abnormality and normal for age noncontrast MRI appearance of the brain. 2. 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 No cervical spinal cord mass effect or signal abnormality. Up to moderate left C5 and C6 foraminal stenosis. Electronically Signed   By: Genevie Ann M.D.   On: 02/18/2019 19:47   US Guided Needle Placement  Result Date: 01/27/2019 INDICATION: 73 year old with right upper quadrant pain, fever and elevated white blood cell count. Indeterminate hepatic lesion and concern for intrahepatic abscess. Plan for ultrasound-guided liver lesion aspiration. EXAM: ULTRASOUND-GUIDED LIVER LESION ASPIRATION MEDICATIONS: Moderate sedation medications ANESTHESIA/SEDATION: Fentanyl 50 mcg IV; Versed 1.0 mg IV Moderate Sedation Time:  14 minutes The patient was continuously monitored during the procedure by the interventional radiology nurse under my direct  supervision. COMPLICATIONS: None immediate. PROCEDURE: Informed written consent was obtained from the patient after Maritza Hosterman thorough discussion of the procedural risks, benefits and alternatives. All questions were addressed. Samreen Seltzer timeout was performed prior to the initiation of the procedure. Liver was evaluated with ultrasound. Heterogeneous and slightly hyperechoic area in the anterior right hepatic lobe was identified. This area corresponds with the recent abnormalities on CT and MRI. Right upper abdomen was prepped with chlorhexidine and sterile field was created. Skin and soft tissues were anesthetized with 1% lidocaine. 18 gauge trocar needle was directed into this heterogeneous liver lesion. Approximately 4 mL of thick bloody fluid was aspirated. No additional fluid could be removed. Needle removed without complication. Bandage placed over the puncture site. FINDINGS: Heterogeneous and slightly hyperechoic lesion in the right hepatic  lobe corresponding with the known abnormality. Approximately 4 mL of thick bloody fluid was removed. No additional fluid could be aspirated. IMPRESSION: Ultrasound-guided aspiration of the indeterminate right hepatic lesion. 4 mL bloody fluid was removed. Fluid was sent for cytology and culture. Electronically Signed   By: Markus Daft M.D.   On: 01/27/2019 17:45   Ct Abdomen Pelvis W Contrast  Result Date: 02/10/2019 CLINICAL DATA:  Constipation, change in stool caliber, rectal bleeding, right upper quadrant pain, recent prior liver abscess drainage EXAM: CT ABDOMEN AND PELVIS WITH CONTRAST TECHNIQUE: Multidetector CT imaging of the abdomen and pelvis was performed using the standard protocol following bolus administration of intravenous contrast. CONTRAST:  161m ISOVUE-300 IOPAMIDOL (ISOVUE-300) INJECTION 61% COMPARISON:  01/26/2019 FINDINGS: Lower chest: No acute abnormality. Hepatobiliary: Redemonstrated subcapsular hypodense lesion of the anterior right lobe of the liver, the main  portion reduced in size compared to prior examination measuring approximately 2.6 cm (series 2, image 26). Status post cholecystectomy. Pancreas: Unremarkable. No pancreatic ductal dilatation or surrounding inflammatory changes. Spleen: Normal in size without focal abnormality. Adrenals/Urinary Tract: Adrenal glands are unremarkable. Kidneys are normal, without renal calculi, focal lesion, or hydronephrosis. Bladder is unremarkable. Stomach/Bowel: Stomach is within normal limits. Appendix not clearly visualized and may be surgically absent. No evidence of bowel wall thickening, distention, or inflammatory changes. Descending and sigmoid diverticulosis without evidence of acute diverticulitis. There is no large burden of stool in the colon. Vascular/Lymphatic: Calcific atherosclerosis. No enlarged abdominal or pelvic lymph nodes. Reproductive: No mass or other abnormality. Status post hysterectomy. Other: No abdominal wall hernia or abnormality. No abdominopelvic ascites. Musculoskeletal: No acute or significant osseous findings. IMPRESSION: 1. Sigmoid diverticulosis without evidence of obstipating pathology. No large burden of stool in the colon. No definite findings to explain rectal bleeding. 2. Redemonstrated subcapsular hypodense lesion of the anterior right lobe of the liver, the main portion reduced in size compared to prior examination measuring approximately 2.6 cm (series 2, image 26), consistent with residua of reported abscess drained under ultrasound guidance. 3.  Other chronic and incidental findings as detailed above. Electronically Signed   By: AEddie CandleM.D.   On: 02/10/2019 17:00   Ct Abdomen Pelvis W Contrast  Result Date: 01/26/2019 CLINICAL DATA:  Abdominal tenderness. Right lateral rib pain with inspiration. Fever. EXAM: CT ABDOMEN AND PELVIS WITH CONTRAST TECHNIQUE: Multidetector CT imaging of the abdomen and pelvis was performed using the standard protocol following bolus administration  of intravenous contrast. CONTRAST:  1038mOMNIPAQUE IOHEXOL 300 MG/ML SOLN, 1573mMNIPAQUE IOHEXOL 300 MG/ML SOLN COMPARISON:  08/16/2015 FINDINGS: Lower chest: Fluid density lesion adjacent to the right inferior pulmonary vein is stable since prior study and comparing back to an exam from 10/14/2014. This measures water attenuation and is compatible with Ia Leeb benign cystic lesion, potentially right pulmonary venous recess or pericardial cyst. Airspace opacity in the posterior right costophrenic sulcus may be related to atelectasis although infection can not be excluded. Hepatobiliary: 4.1 x 4.0 cm ill-defined low-density lesion is identified in the right liver (26/2). 5 mm hypoattenuating subcapsular lesion in the lateral segment left liver is stable since prior study. Subtle nodularity of liver contour is associated with prominent lateral segment left liver, raising the question of cirrhosis. Gallbladder surgically absent. No intrahepatic or extrahepatic biliary dilation. Pancreas: No focal mass lesion. No dilatation of the main duct. No intraparenchymal cyst. No peripancreatic edema. Spleen: No splenomegaly. No focal mass lesion. Adrenals/Urinary Tract: No adrenal nodule or mass. Kidneys unremarkable. No evidence for  hydroureter. The urinary bladder appears normal for the degree of distention. Stomach/Bowel: Tiny hiatal hernia. Stomach otherwise unremarkable. Duodenum is normally positioned as is the ligament of Treitz. No small bowel wall thickening. No small bowel dilatation. The terminal ileum is normal. The appendix is not visualized, but there is no edema or inflammation in the region of the cecum. No gross colonic mass. No colonic wall thickening. Diverticular changes are noted in the left colon without evidence of diverticulitis. Vascular/Lymphatic: There is abdominal aortic atherosclerosis without aneurysm. 10 mm short axis upper normal hepato duodenal ligament lymph node is not substantially changed in the  interval. Other upper normal lymph nodes in the hepato duodenal ligament are also stable. Tiny gastrohepatic ligament lymph nodes are unchanged. No retroperitoneal lymphadenopathy 7 mm pericaval lymph node in the upper pelvis is stable. No pelvic sidewall lymphadenopathy. Reproductive: Uterus surgically absent.  There is no adnexal mass. Other: No intraperitoneal free fluid. Musculoskeletal: No worrisome lytic or sclerotic osseous abnormality. IMPRESSION: 1. 4.1 x 4.0 cm ill-defined low-density lesion in the peripheral right liver. Given the history of high fever and right abdominal/rib pain, abscess would be Alieah Brinton distinct consideration. No diverticulitis or other etiology identified to account for potential abscess. Primary hepatic neoplasm and metastatic disease not excluded. Tissue sampling may be warranted. Liver MRI without and with contrast might provide additional useful information period. 2. Subtle nodularity of liver contour associated with asymmetric enlargement lateral segment left liver. Imaging features raise concern for underlying cirrhosis. Stable borderline lymphadenopathy in the hepato duodenal ligament likely reactive. 3. Airspace disease in the posterior right costophrenic sulcus may be atelectatic, but pneumonia not excluded. 4. Left colonic diverticulosis without diverticulitis. 5.  Aortic Atherosclerois (ICD10-170.0) 6. These results will be called to the ordering clinician or representative by the Radiologist Assistant, and communication documented in the PACS or zVision Dashboard. Electronically Signed   By: Misty Stanley M.D.   On: 01/26/2019 15:01   Mr Liver W Wo Contrast  Result Date: 01/27/2019 CLINICAL DATA:  Evaluate possible liver abscess. Fever. Abdominal tenderness EXAM: MRI ABDOMEN WITHOUT AND WITH CONTRAST TECHNIQUE: Multiplanar multisequence MR imaging of the abdomen was performed both before and after the administration of intravenous contrast. CONTRAST:  10 mL Gadavist  COMPARISON:  CT 01/26/2019, 08/16/2015 FINDINGS: Lower chest:  Lung bases are clear. Hepatobiliary: Within the RIGHT hepatic lobe, there is Jahaad Penado rounded lesion which is mixed high signal intensity on T2 weighted imaging measuring 5.0 x 4.8 cm (image 31/3). There is Laureen Frederic rim of more diffuse high signal intensity around the lesion suggesting edema within the liver parenchyma. On precontrast T1 weighted imaging lesion is hypointense. On postcontrast imaging there is thin peripheral enhancing rim and enhancing internal septations. The majority of the internal aspect of the lesion is nonenhancing (image series 1002 and 1003). The peripheral rim is mildly enhancing. On diffusion-weighted imaging, lesion is hyperintense and mildly hypointense on the ADC map. No biliary duct dilatation. Postcholecystectomy. Normal common bile duct. Pancreas: Normal pancreatic parenchymal intensity. No ductal dilatation or inflammation. Spleen: Normal spleen. Adrenals/urinary tract: Adrenal glands and kidneys are normal. Stomach/Bowel: Stomach and limited of the small bowel is unremarkable Vascular/Lymphatic: Abdominal aortic normal caliber. No retroperitoneal periportal lymphadenopathy. Musculoskeletal: No aggressive osseous lesion IMPRESSION: Round septated lesion in the RIGHT hepatic lobe with peripheral rim of enhancing edema is most consistent with Kathalene Sporer HEPATIC ABSCESS. Less favored differential would include Dewitte Vannice cystic neoplasm or necrotic lesion such is biliary cystadenoma, mucinous lesion, or necrotic metastasis. Electronically Signed  By: Suzy Bouchard M.D.   On: 01/27/2019 13:58   Dg Chest Port 1 View  Result Date: 01/26/2019 CLINICAL DATA:  Fever and chest pain EXAM: PORTABLE CHEST 1 VIEW COMPARISON:  01/25/2019 FINDINGS: Normal heart size. Mild aortic tortuosity. Stable appearance of the hila. There is no edema, consolidation, effusion, or pneumothorax. IMPRESSION: No evidence of pneumonia or other acute finding. Electronically  Signed   By: Monte Fantasia M.D.   On: 01/26/2019 05:54   Ct Head Code Stroke Wo Contrast  Result Date: 02/18/2019 CLINICAL DATA:  Code stroke. Patient awoke at 12:45 today with slurred speech and LEFT-sided numbness. EXAM: CT HEAD WITHOUT CONTRAST TECHNIQUE: Contiguous axial images were obtained from the base of the skull through the vertex without intravenous contrast. COMPARISON:  None. FINDINGS: Brain: No evidence of acute infarction, hemorrhage, hydrocephalus, extra-axial collection or mass lesion/mass effect. Normal for age cerebral volume and white matter appearance. Vascular: Calcification of the cavernous internal carotid arteries consistent with cerebrovascular atherosclerotic disease. No signs of intracranial large vessel occlusion. Skull: Intact. Sinuses/Orbits: Negative. Other: None. ASPECTS [facility] Stroke Program Early CT Score) - Ganglionic level infarction (caudate, lentiform nuclei, internal capsule, insula, M1-M3 cortex): 7 - Supraganglionic infarction (M4-M6 cortex): 3 Total score (0-10 with 10 being normal): 10 IMPRESSION: 1. Negative exam. 2. ASPECTS is 10. * These results were communicated to Dr. Cheral Marker at 4:30 pmon 3/12/2020by text page via the [facility] messaging system. Electronically Signed   By: Staci Righter M.D.   On: 02/18/2019 16:31    Microbiology: No results found for this or any previous visit (from the past 240 hour(s)).   Labs: Basic Metabolic Panel: Recent Labs  Lab 02/18/19 1605 02/18/19 1650 02/19/19 0503  NA 137 138 136  K 6.0* 4.6 4.2  CL 109  --  102  CO2 21*  --  25  GLUCOSE 117*  --  112*  BUN 12  --  11  CREATININE 0.75 0.70 0.88  CALCIUM 8.3*  --  8.9   Liver Function Tests: Recent Labs  Lab 02/18/19 1605 02/19/19 0503  AST 76* 24  ALT 11 21  ALKPHOS 73 70  BILITOT 1.9* 0.8  PROT 6.0* 6.6  ALBUMIN 3.1* 3.2*   No results for input(s): LIPASE, AMYLASE in the last 168 hours. No results for input(s): AMMONIA in the last 168  hours. CBC: Recent Labs  Lab 02/18/19 1650 02/19/19 0503  WBC  --  6.2  HGB 13.3 13.0  HCT 39.0 40.1  MCV  --  90.3  PLT  --  206   Cardiac Enzymes: No results for input(s): CKTOTAL, CKMB, CKMBINDEX, TROPONINI in the last 168 hours. BNP: BNP (last 3 results) Recent Labs    01/30/19 1439  BNP 295.3*    ProBNP (last 3 results) No results for input(s): PROBNP in the last 8760 hours.  CBG: Recent Labs  Lab 02/18/19 1719  GLUCAP 103*       Signed:  Fayrene Helper MD.  [facility] 02/19/2019, 12:46 PM

## 2019-02-19 NOTE — Evaluation (Addendum)
Physical Therapy Evaluation Patient Details Name: Abigail Ryan MRN: [id number] DOB: 1946/04/06 Today's Date: 02/19/2019   History of Present Illness  Patient is a 73 y/o female who presents with slurred speech, left facial droop and weakness. Head CT and MRI unremarkable. PMH includes thyroid disease, renal stones.   Clinical Impression  Patient presents with slurred speech, generalized weakness (LLE>RLE), numbness in bil hands, impaired balance and impaired mobility s/p above. Pt mod I PTA using RW as needed and furniture walking at home. Lives with spouse who has dementia and helps care for him. Today, pt tolerated transfers, gait training and stair training with Min guard for safety. Able to state 3/3 words for STM recall but difficulty dual tasking during gait training resulting in balance deficits. Pt has children who can help PRN. Pt is declining HH services. Education re: BeFAST. Will follow acutely to maximize independence and mobility prior to return home.    Follow Up Recommendations Home health PT;Supervision - Intermittent(pt declining HH services)    Equipment Recommendations  None recommended by PT    Recommendations for Other Services       Precautions / Restrictions Precautions Precautions: Fall Restrictions Weight Bearing Restrictions: No      Mobility  Bed Mobility Overal bed mobility: Needs Assistance Bed Mobility: Supine to Sit     Supine to sit: Supervision;HOB elevated     General bed mobility comments: No assist needed, use of rail.   Transfers Overall transfer level: Needs assistance Equipment used: None Transfers: Sit to/from Stand Sit to Stand: Min guard         General transfer comment: Min guard for safety. Stood from [employer]. Transferred to chair post ambulation.   Ambulation/Gait Ambulation/Gait assistance: Min guard Gait Distance (Feet): 250 Feet Assistive device: IV Pole Gait Pattern/deviations: Step-through pattern;Decreased stride  length Gait velocity: .70 ft/sec Gait velocity interpretation: <1.8 ft/sec, indicate of risk for recurrent falls General Gait Details: Slow, mildly unsteady gait holding onto IV pole for support; report using furniture at home for support; HR stable 70-80s bpm.  Stairs Stairs: Yes Stairs assistance: Min guard Stair Management: Two rails;Alternating pattern Number of Stairs: 5(x2 bouts) General stair comments: Cues for safety/technique.  Wheelchair Mobility    Modified Rankin (Stroke Patients Only) Modified Rankin (Stroke Patients Only) Pre-Morbid Rankin Score: No significant disability Modified Rankin: Moderately severe disability     Balance Overall balance assessment: Needs assistance Sitting-balance support: Feet supported;No upper extremity supported Sitting balance-Leahy Scale: Good     Standing balance support: During functional activity Standing balance-Leahy Scale: Fair Standing balance comment: able to stand without UE support, does better with UE support for walking.                             Pertinent Vitals/Pain Pain Assessment: 0-10 Pain Score: 10-Worst pain ever Pain Location: back, ribs Pain Descriptors / Indicators: Sore;Aching Pain Intervention(s): Monitored during session;Repositioned    Home Living Family/patient expects to be discharged to:: Private residence Living Arrangements: Spouse/significant other(has dementia) Available Help at Discharge: Available PRN/intermittently;Family Type of Home: House Home Access: Stairs to enter Entrance Stairs-Rails: Right Entrance Stairs-Number of Steps: 2 Home Layout: One level Home Equipment: Cane - single point;Walker - 2 wheels;Bedside commode      Prior Function Level of Independence: Independent         Comments: Pt takes care of husband with alzheimer's. Drives. 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 Discharged from hospital a few weeks ago and reports not  back at functional baseline from that. Stopped using RW recently.      Hand Dominance   Dominant Hand: Right    Extremity/Trunk Assessment   Upper Extremity Assessment Upper Extremity Assessment: Defer to OT evaluation(Decreased sensation dorsum of bil hands; coordination WFLs)    Lower Extremity Assessment Lower Extremity Assessment: Overall WFL for tasks assessed(LLE grossly ~4/5 throughout; sensation WFL BLEs. )    Cervical / Trunk Assessment Cervical / Trunk Assessment: Normal  Communication   Communication: No difficulties  Cognition Arousal/Alertness: Awake/alert Behavior During Therapy: WFL for tasks assessed/performed Overall Cognitive Status: No family/caregiver present to determine baseline cognitive functioning                                 General Comments: A&Ox4. WFL for basic mobility tasks. Able to state 3/3 words for memory recall. Difficulty counting backwards by 7s during mobility.       General Comments      Exercises     Assessment/Plan    PT Assessment Patient needs continued PT services  PT Problem List Decreased strength;Pain;Decreased knowledge of use of DME;Decreased balance;Decreased mobility;Decreased cognition       PT Treatment Interventions Gait training;Therapeutic exercise;Patient/family education;Stair training;Functional mobility training;Balance training;Therapeutic activities;Neuromuscular re-education;Cognitive remediation    PT Goals (Current goals can be found in the Care Plan section)  Acute Rehab PT Goals Patient Stated Goal: to go home PT Goal Formulation: With patient Time For Goal Achievement: 03/05/19 Potential to Achieve Goals: Good    Frequency Min 3X/week   Barriers to discharge        Co-evaluation               AM-PAC PT "6 Clicks" Mobility  Outcome Measure Help needed turning from your back to your side while in a flat bed without using bedrails?: A Little Help needed moving from  lying on your back to sitting on the side of a flat bed without using bedrails?: A Little Help needed moving to and from a bed to a chair (including a wheelchair)?: None Help needed standing up from a chair using your arms (e.g., wheelchair or bedside chair)?: A Little Help needed to walk in hospital room?: A Little Help needed climbing 3-5 steps with a railing? : A Little 6 Click Score: 19    End of Session Equipment Utilized During Treatment: Gait belt Activity Tolerance: Patient tolerated treatment well Patient left: in chair;with call bell/phone within reach Nurse Communication: Mobility status PT Visit Diagnosis: Other abnormalities of gait and mobility (R26.89);Difficulty in walking, not elsewhere classified (R26.2)    Time: 9924-2683 PT Time Calculation (min) (ACUTE ONLY): 31 min   Charges:   PT Evaluation $PT Eval Low Complexity: 1 Low PT Treatments $Gait Training: 8-22 mins        Abigail Ryan, [place], DPT Acute Rehabilitation Services Pager [phone] Office [phone]      Abigail Ryan 02/19/2019, 8:57 AM

## 2019-02-19 NOTE — Plan of Care (Signed)
  Problem: Education: Goal: Knowledge of secondary prevention will improve Outcome: Completed/Met Goal: Knowledge of patient specific risk factors addressed and post discharge goals established will improve Outcome: Completed/Met Goal: Individualized Educational Video(s) Outcome: Completed/Met   Problem: Education: Goal: Knowledge of disease or condition will improve Outcome: Completed/Met Goal: Knowledge of secondary prevention will improve Outcome: Completed/Met Goal: Knowledge of patient specific risk factors addressed and post discharge goals established will improve Outcome: Completed/Met Goal: Individualized Educational Video(s) Outcome: Completed/Met   Problem: Coping: Goal: Will verbalize positive feelings about self Outcome: Completed/Met Goal: Will identify appropriate support needs Outcome: Completed/Met   Problem: Health Behavior/Discharge Planning: Goal: Ability to manage health-related needs will improve Outcome: Completed/Met   Problem: Self-Care: Goal: Ability to participate in self-care as condition permits will improve Outcome: Completed/Met Goal: Verbalization of feelings and concerns over difficulty with self-care will improve Outcome: Completed/Met Goal: Ability to communicate needs accurately will improve Outcome: Completed/Met   Problem: Nutrition: Goal: Risk of aspiration will decrease Outcome: Completed/Met Goal: Dietary intake will improve Outcome: Completed/Met   Problem: Intracerebral Hemorrhage Tissue Perfusion: Goal: Complications of Intracerebral Hemorrhage will be minimized Outcome: Completed/Met   Problem: Ischemic Stroke/TIA Tissue Perfusion: Goal: Complications of ischemic stroke/TIA will be minimized Outcome: Completed/Met   Problem: Spontaneous Subarachnoid Hemorrhage Tissue Perfusion: Goal: Complications of Spontaneous Subarachnoid Hemorrhage will be minimized Outcome: Completed/Met

## 2019-02-19 NOTE — Evaluation (Cosign Needed)
Speech Language Pathology Evaluation Patient Details Name: Abigail Ryan MRN: [id number] DOB: 01/08/1946 Today's Date: 02/19/2019 Time: 0130-0150 SLP Time Calculation (min) (ACUTE ONLY): 20 min  Problem List:  Patient Active Problem List   Diagnosis Date Noted  . Acute CVA (cerebrovascular accident) (HCC) 02/18/2019  . H/O: GI bleed 02/18/2019  . Atrial fibrillation with RVR (HCC) 01/27/2019  . Liver abscess 01/27/2019  . Hypothyroid 01/25/2019  . Sepsis (HCC) 01/25/2019  . Elevated troponin 01/25/2019   Past Medical History:  Past Medical History:  Diagnosis Date  . Renal stones   . Thyroid disease    Past Surgical History:  Past Surgical History:  Procedure Laterality Date  . ABDOMINAL HYSTERECTOMY    . APPENDECTOMY    . CHOLECYSTECTOMY     HPI:  Pt is a 73 y.o. female with medical hx significant of atrial fibrillation, HTN, and hypothyroidism and  GI bleed as of a week ago.  Pt had hepatic abscess for which she is taking antibiotics.  She presented to the ED with facial numbness, L arm and L lower extremity weakness.  Admitted for possible CVA. Head CT  and MRI negative for acute changes.   Assessment / Plan / Recommendation Clinical Impression  Pt, at EOB with family in room, presents with mild dysarthic speech secondary to TIA. L facial droop observed, primarily impacting intelligibility of labial sounds (/p, b, m, w). She reports having the most trouble with /p/ sound, but otherwise reports no concerns with cognitive-linguistic abilities. During informal conversation and reading of multisyllabic words, pt was 90-100% intelligible. Educated pt regarding strategies she could use to increase speech intelligibility when necessary (over articulation, increase vocal intensity). No further f/u from SLP warranted. Encouraged pt to seek out OP SLP services if she has futher concerns about her speech post d/c.    SLP Assessment  SLP Recommendation/Assessment: All further Speech  [place] Pathology  needs can be addressed in the next venue of care SLP Visit Diagnosis: Dysarthria and anarthria (R47.1)    Follow Up Recommendations  Outpatient SLP    Frequency and Duration           SLP Evaluation Cognition  Overall Cognitive Status: Within Functional Limits for tasks assessed Arousal/Alertness: Awake/alert Orientation Level: Oriented X4       Comprehension  Auditory Comprehension Overall Auditory Comprehension: Appears within functional limits for tasks assessed Yes/No Questions: Within Functional Limits Conversation: Complex Visual Recognition/Discrimination Discrimination: Not tested Reading Comprehension Reading Status: Not tested    Expression Expression Primary Mode of Expression: Verbal Verbal Expression Overall Verbal Expression: Appears within functional limits for tasks assessed Written Expression Dominant Hand: Right Written Expression: Not tested   Oral / Motor  Oral Motor/Sensory Function Overall Oral Motor/Sensory Function: Mild impairment Facial Symmetry: Abnormal symmetry left Facial Strength: Reduced left Facial Sensation: Reduced left Motor Speech Overall Motor Speech: Impaired Respiration: Within functional limits Phonation: Normal Resonance: Within functional limits Articulation: Impaired Level of Impairment: Conversation Intelligibility: Intelligibility reduced Word: 75-100% accurate Phrase: 75-100% accurate Sentence: 75-100% accurate Conversation: 75-100% accurate Motor Planning: Witnin functional limits Motor Speech Errors: Aware   GO                    Gardiner Ramus, Student SLP 02/19/2019, 2:06 PM

## 2019-02-19 NOTE — TOC Initial Note (Signed)
Transition of Care [facility]) - Initial/Assessment Note    Patient Details  Name: Abigail Ryan MRN: [id number] Date of Birth: [birth year]  Transition of [facility]) CM/SW Contact:    Kermit Balo, RN Phone Number: 02/19/2019, 12:38 PM  Clinical Narrative:                   Expected Discharge Plan: Home/Self Care Barriers to Discharge: Continued Medical Work up   Patient Goals and CMS Choice Patient states their goals for this hospitalization and ongoing recovery are:: get home to spouse that has dementia      Expected Discharge Plan and Services Expected Discharge Plan: Home/Self Care Discharge Planning Services: CM Consult, Medication Assistance(Eliquis 30 day free card provided to the patient--CM also filled out and faxed in Patient assistance form for Eliquis)   Living arrangements for the past 2 months: Single Family Home(one level)                          Prior Living Arrangements/Services Living arrangements for the past 2 months: Single Family Home(one level) Lives with:: Spouse Patient language and need for interpreter reviewed:: Yes(none) Do you feel safe going back to the place where you live?: Yes      Need for Family Participation in Patient Care: No (Comment) Care giver support system in place?: No (comment)(none needed) Current home services: DME(cane, shower chair, walker, wheelchair---not currently using) Criminal Activity/Legal Involvement Pertinent to Current Situation/Hospitalization: No - Comment as needed  Activities of Daily Living Home Assistive Devices/Equipment: Eyeglasses, Contact lenses, Walker (specify type) ADL Screening (condition at time of admission) Patient's cognitive ability adequate to safely complete daily activities?: Yes Is the patient deaf or have difficulty hearing?: No Does the patient have difficulty seeing, even when wearing glasses/contacts?: No Does the patient have difficulty concentrating, remembering, or making  decisions?: No Patient able to express need for assistance with ADLs?: Yes Does the patient have difficulty dressing or bathing?: No Independently performs ADLs?: Yes (appropriate for developmental age) Does the patient have difficulty walking or climbing stairs?: No Weakness of Legs: None Weakness of Arms/Hands: None  Permission Sought/Granted Permission sought to share information with : Other (comment)([place]) Permission granted to share information with : Yes, Verbal Permission Granted              Emotional Assessment Appearance:: Appears stated age Attitude/Demeanor/Rapport: Gracious Affect (typically observed): Accepting, Appropriate Orientation: : Oriented to Self, Oriented to Place, Oriented to  Time Alcohol / Substance Use: Not Applicable Psych Involvement: No (comment)  Admission diagnosis:  Paresthesia [R20.2] Cerebral infarction, unspecified mechanism [facility]) [I63.9] Patient Active Problem List   Diagnosis Date Noted  . Acute CVA (cerebrovascular accident) (HCC) 02/18/2019  . H/O: GI bleed 02/18/2019  . Atrial fibrillation with RVR (HCC) 01/27/2019  . Liver abscess 01/27/2019  . Hypothyroid 01/25/2019  . Sepsis (HCC) 01/25/2019  . Elevated troponin 01/25/2019   PCP:  Kaleen Mask, MD Pharmacy:   [facility] [address] [place] - [address] [facility] [place] [address] Phone: [phone] Fax: [phone]     Social Determinants of Health (SDOH) Interventions    Readmission Risk Interventions 30 Day Unplanned Readmission Risk Score     ED to Hosp-Admission (Current) from 02/18/2019 in [facility]  30 Day Unplanned Readmission Risk Score (%)  10 Filed at 02/19/2019 1200     This score is the patient's risk of an  unplanned readmission within 30 days of being discharged (0 -100%). The score is based on dignosis, age, lab data, medications, orders, and past utilization.   Low:  0-14.9   Medium:  15-21.9   High: 22-29.9   Extreme: 30 and above       No flowsheet data found.

## 2019-02-19 NOTE — Progress Notes (Signed)
Inpatient Diabetes Program Recommendations  AACE/ADA: New Consensus Statement on Inpatient Glycemic Control (2015)  Target Ranges:  Prepandial:   less than 140 mg/dL      Peak postprandial:   less than 180 mg/dL (1-2 hours)      Critically ill patients:  140 - 180 mg/dL   Lab Results  Component Value Date   GLUCAP 103 (H) 02/18/2019   HGBA1C 7.0 (H) 02/19/2019    Review of Glycemic Control Results for Abigail Ryan, Abigail Ryan (MRN [id number]) as of 02/19/2019 15:39  Ref. Range 02/18/2019 17:19  Glucose-Capillary Latest Ref Range: 70 - 99 mg/dL 450 (H)   Diabetes history: T2DM, per patient was initially dx ~10 years ago; however, no prior h/o DM in chart Outpatient Diabetes medications: None, diet controlled per patient Current orders for Inpatient glycemic control: None  Inpatient Diabetes Program Recommendations:     Spoke with pt and family at bedside about new diagnosis. Patient reports being diagnosed about 10 years ago and has been diet controlled ever since. Discussed A1C results with them and explained what an A1C is, basic pathophysiology of DM Type 2, basic home care, basic diabetes diet nutrition principles, importance of checking CBGs and maintaining good CBG control to prevent long-term and short-term complications. Also, reviewed blood sugar goals at home and when to call MD.  RNs to provide ongoing basic DM education at bedside with this patient.   Patient reports she has made recent adjustments to her diet and tries to limit carbohydrates and increase protein. She does not drink soft drinks or sweet tea. She will be discharged on Metformin, pt to titrate up gradually per her discussion with Dr. Lowell Guitar. Rx sent for home glucometer and patient instructed to check CBGs twice daily, fasting and post-prandial and keep log. Encouraged patient to f/u with PCP within the next 2 weeks and to take CBG log to f/u appt. Thanks, Lujean Rave, MSN, RNC-OB Diabetes Coordinator [phone]  (8a-5p)

## 2019-02-19 NOTE — Discharge Instructions (Signed)
Information on my medicine - ELIQUIS (apixaban)  This medication education was reviewed with me or my healthcare representative as part of my discharge preparation.  The pharmacist that spoke with me during my hospital stay was:  Nunzio Cobbs, Student-PharmD  Why was Eliquis prescribed for you? Eliquis was prescribed for you to reduce the risk of forming blood clots that can cause a stroke if you have a medical condition called atrial fibrillation (a type of irregular heartbeat) OR to reduce the risk of a blood clots forming after orthopedic surgery.  What do You need to know about Eliquis ? Take your Eliquis TWICE DAILY - one tablet in the morning and one tablet in the evening with or without food.  It would be best to take the doses about the same time each day.  If you have difficulty swallowing the tablet whole please discuss with your pharmacist how to take the medication safely.  Take Eliquis exactly as prescribed by your doctor and DO NOT stop taking Eliquis without talking to the doctor who prescribed the medication.  Stopping may increase your risk of developing a new clot or stroke.  Refill your prescription before you run out.  After discharge, you should have regular check-up appointments with your healthcare provider that is prescribing your Eliquis.  In the future your dose may need to be changed if your kidney function or weight changes by a significant amount or as you get older.  What do you do if you miss a dose? If you miss a dose, take it as soon as you remember on the same day and resume taking twice daily.  Do not take more than one dose of ELIQUIS at the same time.  Important Safety Information A possible side effect of Eliquis is bleeding. You should call your healthcare provider right away if you experience any of the following: ? Bleeding from an injury or your nose that does not stop. ? Unusual colored urine (red or dark brown) or unusual colored stools (red or  black). ? Unusual bruising for unknown reasons. ? A serious fall or if you hit your head (even if there is no bleeding).  Some medicines may interact with Eliquis and might increase your risk of bleeding or clotting while on Eliquis. To help avoid this, consult your healthcare provider or pharmacist prior to using any new prescription or non-prescription medications, including herbals, vitamins, non-steroidal anti-inflammatory drugs (NSAIDs) and supplements.  This website has more information on Eliquis (apixaban): [email or web address].

## 2019-02-19 NOTE — Care Management Obs Status (Signed)
MEDICARE OBSERVATION STATUS NOTIFICATION   Patient Details  Name: STARR GAZDA MRN: [id number] Date of Birth: 23-Aug-1946   Medicare Observation Status Notification Given:  Yes    Kermit Balo, RN 02/19/2019, 1:29 PM

## 2019-02-19 NOTE — Progress Notes (Addendum)
STROKE TEAM PROGRESS NOTE   SUBJECTIVE (INTERVAL HISTORY) Her RN is at the bedside.  Pt lying in bed, still has left facial droop and left eye decreased closure, consistent with bell's palsy. She also complain of b/l hand tingling. MRI negative for stroke and MRI C-spine negative for spinal cord compression. She had one episode of rectal bleeding one week ago. Stopped Xarelto at that time. No bleeding since. Her H&H is normal.   OBJECTIVE Vitals:   02/19/19 0200 02/19/19 0400 02/19/19 0600 02/19/19 0818  BP: 124/66 138/68 140/70 120/77  Pulse:    79  Resp:    20  Temp: 98.3 F (36.8 C) 98.2 F (36.8 C)  98.5 F (36.9 C)  TempSrc: Oral Oral  Oral  SpO2: 96% 93%  97%    CBC:  Recent Labs  Lab 02/18/19 1650 02/19/19 0503  WBC  --  6.2  HGB 13.3 13.0  HCT 39.0 40.1  MCV  --  90.3  PLT  --  206    Basic Metabolic Panel:  Recent Labs  Lab 02/18/19 1605 02/18/19 1650 02/19/19 0503  NA 137 138 136  K 6.0* 4.6 4.2  CL 109  --  102  CO2 21*  --  25  GLUCOSE 117*  --  112*  BUN 12  --  11  CREATININE 0.75 0.70 0.88  CALCIUM 8.3*  --  8.9    Lipid Panel:     Component Value Date/Time   CHOL 258 (H) 02/19/2019 0503   TRIG 81 02/19/2019 0503   HDL 59 02/19/2019 0503   CHOLHDL 4.4 02/19/2019 0503   VLDL 16 02/19/2019 0503   LDLCALC 183 (H) 02/19/2019 0503   HgbA1c:  Lab Results  Component Value Date   HGBA1C 7.0 (H) 02/19/2019   Urine Drug Screen:     Component Value Date/Time   LABOPIA POSITIVE (A) 02/18/2019 1630   COCAINSCRNUR NONE DETECTED 02/18/2019 1630   LABBENZ NONE DETECTED 02/18/2019 1630   AMPHETMU NONE DETECTED 02/18/2019 1630   THCU NONE DETECTED 02/18/2019 1630   LABBARB NONE DETECTED 02/18/2019 1630    Alcohol Level     Component Value Date/Time   ETH <10 02/18/2019 1605    IMAGING  Ct Angio Head W Or Wo Contrast Ct Angio Neck W Or Wo Contrast 02/18/2019 IMPRESSION:  1. Mild atherosclerotic changes at the left carotid bifurcation and  cavernous internal carotid arteries bilaterally without significant stenosis. 2. Mild tortuosity of the neck without significant stenosis. This can be seen in the setting of hypertension.  3. Normal CTA circle-of-Willis without significant proximal stenosis, aneurysm, or branch vessel occlusion.    Mr Brain Wo Contrast 02/18/2019 IMPRESSION:  No acute intracranial abnormality and normal for age noncontrast MRI appearance of the brain.    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 No cervical spinal cord mass effect or signal abnormality. Up to moderate left C5 and C6 foraminal stenosis.   Ct Head Code Stroke Wo Contrast 02/18/2019 IMPRESSION:  1. Negative exam.  2. ASPECTS is 10.   Transthoracic Echocardiogram  01/26/2019  1. The left ventricle has normal systolic function, with an ejection fraction of 55-60%. The cavity size was normal. Mild asymmetric septal hypertrophy. Left ventricular diastolic parameters were normal No evidence of left ventricular regional wall  motion abnormalities.  2. The right ventricle has normal systolic function. The cavity was mildly enlarged. There is no increase in right ventricular wall  thickness. Right ventricular systolic pressure could not be assessed.  3. The mitral valve is normal in structure. There is mild mitral annular calcification present.  4. The tricuspid valve is normal in structure.  5. The aortic valve is tricuspid.  6. The inferior vena cava was dilated in size with >50% respiratory variability.  7. No evidence of left ventricular regional wall motion abnormalities.   EKG - SR rate 89 BPM. See cardiology interpretation for full details.   PHYSICAL EXAM  Temp:  [98.1 F (36.7 C)-98.7 F (37.1 C)] 98.5 F (36.9 C) (03/13 0818) Pulse Rate:  [69-91] 79 (03/13 0818) Resp:  [14-21] 20 (03/13 0818) BP: (120-176)/(64-90) 120/77 (03/13 0818) SpO2:  [91 %-100  %] 97 % (03/13 0818)  General - Well nourished, well developed, in no apparent distress.  Ophthalmologic - fundi not visualized due to noncooperation.  Cardiovascular - irregular rate and rhythm, tele and EKG show sinus arrhythmia .  Mental Status -  Level of arousal and orientation to time, place, and person were intact. Language including expression, naming, repetition, comprehension was assessed and found intact. Fund of Knowledge was assessed and was intact.  Cranial Nerves II - XII - II - Visual field intact OU. III, IV, VI - Extraocular movements intact. V - Facial sensation intact bilaterally. VII - left upper and lower facial nerve palsy. VIII - Hearing & vestibular intact bilaterally. X - Palate elevates symmetrically. XI - Chin turning & shoulder shrug intact bilaterally. XII - Tongue protrusion intact.  Motor Strength - The patient's strength was normal in all extremities and pronator drift was absent.  Bulk was normal and fasciculations were absent.   Motor Tone - Muscle tone was assessed at the neck and appendages and was normal.  Reflexes - The patient's reflexes were symmetrical in all extremities and she had no pathological reflexes.  Sensory - Light touch, temperature/pinprick were assessed and were symmetrical.    Coordination - The patient had normal movements in the hands with no ataxia or dysmetria.  Tremor was absent.  Gait and Station - deferred.    ASSESSMENT/PLAN [name] is a 73 y.o. female with history of atrial fibrillation, who had been on anticoagulation up until last week when she was evaluated with a lower GI bleed, hepatic abscess on antibiotic therapy and hypothyroidism presenting with left facial numbness, gait difficulties, and left sided weakness. She did not receive IV t-PA due to late presentation.  Left bell's palsy    Resultant  Left upper and lower facial nerve palsy  CT head - negative  MRI head - negative  CTA H&N -  unremarkable, hypoplastic posterior circulation   2D Echo 01/26/19 EF 55-60%  LDL - 183  HgbA1c - 7.0  UDS - opiates  VTE prophylaxis - SCDs  Diet  - Heart healthy with thin liquids.  Will start valacyclovir and prednisone for 7 days treatment  Will give artifical tears PRN for left corneal protection  Therapy recommendations:  HHPT  Disposition:  Pending  PAF  Was on Xarelto   Stopped Xarelto one week ago due to one episode of LGIB  No recurrence and H&H stable  Recommend change Xarelto to eliquis  Close monitoring LGIB  B/l hand numbness  MRI C-spine showed DJD but no spinal cord compression  Likely neuropathy   Recommend outpt EMG/NCS at time of neuro follow up   Hypertension  Stable . Long-term BP goal normotensive  Hyperlipidemia  Lipid lowering medication PTA:  none  LDL 183, goal < 70  Current lipid lowering medication: Lipitor 40 mg daily  Continue statin at discharge  Other Stroke Risk Factors  Advanced age  Obesity  Other Active Problems  Hepatic abscess - on levaquin   Hospital day # 1  Neurology will sign off. Please call with questions. Pt will follow up with [facility] in about 2 weeks. Thanks for the consult.  Marvel Plan, MD PhD Stroke Neurology 02/19/2019 11:42 AM  I spent  35 minutes in total face-to-face time with the patient, more than 50% of which was spent in counseling and coordination of care, reviewing test results, images and medication, and discussing the diagnosis of bell's palsy, PAF, b/l hand numbness, treatment plan and potential prognosis. This patient's care requiresreview of multiple databases, neurological assessment, discussion with family, other specialists and medical decision making of high complexity.    To contact Stroke Continuity provider, please refer to [email or web address]. After hours, contact General Neurology

## 2019-02-19 NOTE — Care Management CC44 (Signed)
Condition Code 44 Documentation Completed  Patient Details  Name: GABREILLE VILLARI MRN: [id number] Date of Birth: 02-02-1946   Condition Code 44 given:  Yes Patient signature on Condition Code 44 notice:  Yes Documentation of 2 MD's agreement:  Yes Code 44 added to claim:  Yes    Kermit Balo, RN 02/19/2019, 1:29 PM

## 2019-02-19 NOTE — Evaluation (Signed)
Occupational Therapy Evaluation Patient Details Name: Abigail Ryan MRN: [id number] DOB: 04-05-1946 Today's Date: 02/19/2019    History of Present Illness Patient is a 73 y/o female who presents with slurred speech, left facial droop and weakness. Head CT and MRI unremarkable. PMH includes thyroid disease, renal stones.    Clinical Impression   Patient evaluated by Occupational Therapy with no further acute OT needs identified. All education has been completed and the patient has no further questions. See below for any follow-up Occupational Therapy or equipment needs. OT to sign off. Thank you for referral.      Follow Up Recommendations  No OT follow up    Equipment Recommendations  None recommended by OT    Recommendations for Other Services       Precautions / Restrictions Precautions Precautions: Fall      Mobility Bed Mobility               General bed mobility comments: sitting EOB on arrival  Transfers Overall transfer level: Modified independent                    Balance                                           ADL either performed or assessed with clinical judgement   ADL Overall ADL's : Modified independent                                       General ADL Comments: demonstrates tub transfer, balance challenges and currently dressed for home. pt denies fatigue with adls. Daughter and spouse present this session     Vision         Perception     Praxis      Pertinent Vitals/Pain Pain Assessment: No/denies pain     Hand Dominance Right   Extremity/Trunk Assessment Upper Extremity Assessment Upper Extremity Assessment: Overall WFL for tasks assessed       Cervical / Trunk Assessment Cervical / Trunk Assessment: Normal   Communication Communication Communication: No difficulties   Cognition Arousal/Alertness: Awake/alert Behavior During Therapy: WFL for tasks assessed/performed Overall  Cognitive Status: Within Functional Limits for tasks assessed                                     General Comments  pt provided handout of support groups in [place] for dementia     Exercises     Shoulder Instructions      Home Living Family/patient expects to be discharged to:: Private residence Living Arrangements: Spouse/significant other Available Help at Discharge: Available PRN/intermittently;Family Type of Home: House Home Access: Stairs to enter [employer] of Steps: 2 Entrance Stairs-Rails: Right Home Layout: One level     Bathroom Shower/Tub: Chief Strategy Officer: Standard     Home Equipment: Cane - single point;Walker - 2 wheels;Bedside commode;Grab bars - tub/shower   Additional Comments: takes care of pet Abigail Ryan  Lives With: Spouse    Prior Functioning/Environment Level of Independence: Independent        Comments: Pt takes care of husband with alzheimer's. Drives. 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 Discharged from  hospital a few weeks ago and reports not back at functional baseline from that. Stopped using RW recently.         OT Problem List:        OT Treatment/Interventions:      OT Goals(Current goals can be found in the care plan section) Acute Rehab OT Goals Patient Stated Goal: to go home  OT Frequency:     Barriers to D/C:            Co-evaluation              AM-PAC OT "6 Clicks" Daily Activity     Outcome Measure Help from another person eating meals?: None Help from another person taking care of personal grooming?: None Help from another person toileting, which includes using toliet, bedpan, or urinal?: None Help from another person bathing (including washing, rinsing, drying)?: None Help from another person to put on and taking off regular upper body clothing?: None Help from another person to put on and taking off regular lower body clothing?: None 6 Click  Score: 24   End of Session Nurse Communication: Mobility status;Precautions  Activity Tolerance: Patient tolerated treatment well Patient left: in bed;with call bell/phone within reach;with family/visitor present  OT Visit Diagnosis: Unsteadiness on feet (R26.81)                Time: 1340-1405 OT Time Calculation (min): 25 min Charges:  OT General Charges $OT Visit: 1 Visit OT Evaluation $OT Eval Moderate Complexity: 1 Mod   Mateo Flow, OTR/L  Acute Rehabilitation Services Pager: [phone] Office: [phone] .   Mateo Flow 02/19/2019, 2:17 PM

## 2019-02-19 NOTE — TOC Transition Note (Signed)
Transition of Care [facility]) - CM/SW Discharge Note   Patient Details  Name: CAMDYNN SNIPES MRN: [id number] Date of Birth: 07-06-1946  Transition of Care [facility]) CM/SW Contact:  Kermit Balo, RN Phone Number: 02/19/2019, 1:43 PM   Clinical Narrative:    Family providing transport home.    Final next level of care: Home w Home Health Services Barriers to Discharge: No Barriers Identified   Patient Goals and CMS Choice Patient states their goals for this hospitalization and ongoing recovery are:: get home to spouse that has dementia      Discharge Placement                       Discharge Plan and Services Discharge Planning Services: CM Consult, Medication Assistance(Eliquis 30 day free card provided to the patient--CM also filled out and faxed in Patient assistance form for Eliquis)                HH Arranged: RN, PT HH Agency: [facility]-spoke with Kandee Keen and he accepted the referral   Social Determinants of Health (SDOH) Interventions     Readmission Risk Interventions No flowsheet data found.

## 2019-03-18 ENCOUNTER — Other Ambulatory Visit: Payer: Self-pay | Admitting: Family Medicine

## 2019-03-18 ENCOUNTER — Ambulatory Visit
Admission: RE | Admit: 2019-03-18 | Discharge: 2019-03-18 | Disposition: A | Discharge status: Home / Self Care | Payer: Medicare Other | Source: Ambulatory Visit | Attending: Family Medicine | Admitting: Family Medicine

## 2019-03-18 ENCOUNTER — Other Ambulatory Visit: Payer: Self-pay

## 2019-03-18 DIAGNOSIS — K750 Abscess of liver: Secondary | ICD-10-CM

## 2019-03-18 MED ORDER — IOPAMIDOL (ISOVUE-300) INJECTION 61%
100.0000 mL | Freq: Once | INTRAVENOUS | Status: AC | PRN
  Administered 2019-03-18: 11:00:00 100 mL via INTRAVENOUS

## 2019-04-08 ENCOUNTER — Other Ambulatory Visit: Payer: Self-pay | Admitting: Family Medicine

## 2019-04-08 DIAGNOSIS — K750 Abscess of liver: Secondary | ICD-10-CM

## 2019-04-20 ENCOUNTER — Other Ambulatory Visit: Payer: Self-pay

## 2019-04-20 ENCOUNTER — Ambulatory Visit
Admission: RE | Admit: 2019-04-20 | Discharge: 2019-04-20 | Disposition: A | Discharge status: Home / Self Care | Payer: Medicare Other | Source: Ambulatory Visit | Attending: Family Medicine | Admitting: Family Medicine

## 2019-04-20 DIAGNOSIS — K750 Abscess of liver: Secondary | ICD-10-CM

## 2019-04-20 MED ORDER — IOPAMIDOL (ISOVUE-300) INJECTION 61%
125.0000 mL | Freq: Once | INTRAVENOUS | Status: AC | PRN
  Administered 2019-04-20: 125 mL via INTRAVENOUS

## 2019-08-19 ENCOUNTER — Other Ambulatory Visit: Payer: Self-pay | Admitting: Family Medicine

## 2019-08-19 DIAGNOSIS — K750 Abscess of liver: Secondary | ICD-10-CM

## 2019-08-24 ENCOUNTER — Ambulatory Visit
Admission: RE | Admit: 2019-08-24 | Discharge: 2019-08-24 | Disposition: A | Discharge status: Home / Self Care | Payer: Medicare Other | Source: Ambulatory Visit | Attending: Family Medicine | Admitting: Family Medicine

## 2019-08-24 DIAGNOSIS — K750 Abscess of liver: Secondary | ICD-10-CM

## 2019-08-24 MED ORDER — IOPAMIDOL (ISOVUE-300) INJECTION 61%
125.0000 mL | Freq: Once | INTRAVENOUS | Status: AC | PRN
  Administered 2019-08-24: 125 mL via INTRAVENOUS

## 2019-10-20 ENCOUNTER — Emergency Department (HOSPITAL_COMMUNITY)
Admission: EM | Admit: 2019-10-20 | Discharge: 2019-10-20 | Disposition: A | Discharge status: Home / Self Care | Payer: Medicare Other | Attending: Emergency Medicine | Admitting: Emergency Medicine

## 2019-10-20 ENCOUNTER — Emergency Department (HOSPITAL_COMMUNITY): Payer: Medicare Other

## 2019-10-20 ENCOUNTER — Encounter (HOSPITAL_COMMUNITY): Payer: Self-pay

## 2019-10-20 ENCOUNTER — Other Ambulatory Visit: Payer: Self-pay

## 2019-10-20 DIAGNOSIS — E039 Hypothyroidism, unspecified: Secondary | ICD-10-CM | POA: Diagnosis not present

## 2019-10-20 DIAGNOSIS — Z79899 Other long term (current) drug therapy: Secondary | ICD-10-CM | POA: Diagnosis not present

## 2019-10-20 DIAGNOSIS — R509 Fever, unspecified: Secondary | ICD-10-CM | POA: Diagnosis present

## 2019-10-20 DIAGNOSIS — K529 Noninfective gastroenteritis and colitis, unspecified: Secondary | ICD-10-CM

## 2019-10-20 DIAGNOSIS — R1032 Left lower quadrant pain: Secondary | ICD-10-CM | POA: Insufficient documentation

## 2019-10-20 DIAGNOSIS — Z7901 Long term (current) use of anticoagulants: Secondary | ICD-10-CM | POA: Diagnosis not present

## 2019-10-20 DIAGNOSIS — K5289 Other specified noninfective gastroenteritis and colitis: Secondary | ICD-10-CM | POA: Insufficient documentation

## 2019-10-20 DIAGNOSIS — E119 Type 2 diabetes mellitus without complications: Secondary | ICD-10-CM | POA: Insufficient documentation

## 2019-10-20 LAB — COMPREHENSIVE METABOLIC PANEL
ALT: 27 U/L (ref 0–44)
AST: 29 U/L (ref 15–41)
Albumin: 4.3 g/dL (ref 3.5–5.0)
Alkaline Phosphatase: 84 U/L (ref 38–126)
Anion gap: 10 (ref 5–15)
BUN: 17 mg/dL (ref 8–23)
CO2: 23 mmol/L (ref 22–32)
Calcium: 9.5 mg/dL (ref 8.9–10.3)
Chloride: 104 mmol/L (ref 98–111)
Creatinine, Ser: 0.86 mg/dL (ref 0.44–1.00)
GFR calc Af Amer: >60 mL/min (ref >60)
GFR calc non Af Amer: >60 mL/min (ref >60)
Glucose, Bld: 105 mg/dL — ABNORMAL HIGH (ref 70–99)
Potassium: 4.6 mmol/L (ref 3.5–5.1)
Sodium: 137 mmol/L (ref 135–145)
Total Bilirubin: 0.7 mg/dL (ref 0.3–1.2)
Total Protein: 7.9 g/dL (ref 6.5–8.1)

## 2019-10-20 LAB — CBC
HCT: 46 % (ref 36.0–46.0)
Hemoglobin: 14.4 g/dL (ref 12.0–15.0)
MCH: 29.5 pg (ref 26.0–34.0)
MCHC: 31.3 g/dL (ref 30.0–36.0)
MCV: 94.3 fL (ref 80.0–100.0)
Platelets: 245 10*3/uL (ref 150–400)
RBC: 4.88 MIL/uL (ref 3.87–5.11)
RDW: 13.2 % (ref 11.5–15.5)
WBC: 6.7 10*3/uL (ref 4.0–10.5)
nRBC: 0 % (ref 0.0–0.2)

## 2019-10-20 LAB — URINALYSIS, ROUTINE W REFLEX MICROSCOPIC
Bilirubin Urine: NEGATIVE
Glucose, UA: NEGATIVE mg/dL
Hgb urine dipstick: NEGATIVE
Ketones, ur: NEGATIVE mg/dL
Leukocytes,Ua: NEGATIVE
Nitrite: NEGATIVE
Protein, ur: NEGATIVE mg/dL
Specific Gravity, Urine: 1.042 — ABNORMAL HIGH (ref 1.005–1.030)
pH: 5 (ref 5.0–8.0)

## 2019-10-20 LAB — LIPASE, BLOOD: Lipase: 31 U/L (ref 11–51)

## 2019-10-20 MED ORDER — IOHEXOL 300 MG/ML  SOLN
100.0000 mL | Freq: Once | INTRAMUSCULAR | Status: AC | PRN
  Administered 2019-10-20: 17:00:00 100 mL via INTRAVENOUS

## 2019-10-20 MED ORDER — METRONIDAZOLE 500 MG PO TABS: 500.0000 mg | ORAL_TABLET | Freq: Three times a day (TID) | ORAL | 0 refills | Status: DC

## 2019-10-20 MED ORDER — SODIUM CHLORIDE (PF) 0.9 % IJ SOLN
INTRAMUSCULAR | Status: AC
  Filled 2019-10-20: qty 50

## 2019-10-20 MED ORDER — CIPROFLOXACIN HCL 500 MG PO TABS: 500.0000 mg | ORAL_TABLET | Freq: Two times a day (BID) | ORAL | 0 refills | Status: DC

## 2019-10-20 MED ORDER — SODIUM CHLORIDE 0.9% FLUSH: 3.0000 mL | Freq: Once | INTRAVENOUS | Status: DC

## 2019-10-20 NOTE — Discharge Instructions (Addendum)
Take the antibiotics as prescribed.  Follow-up with your primary care doctor next week to be rechecked to make sure you are improving.  Return as needed for worsening symptoms.

## 2019-10-20 NOTE — ED Triage Notes (Signed)
Patient c/o intermittent LLQ abdominal pain x 4 days and fever x 3 days. Patient states that she has a history of an abscess last year and this feels the same.

## 2019-10-20 NOTE — ED Provider Notes (Signed)
[facility] DEPT Provider Note   CSN: [id number] Arrival date & time: 10/20/19  1254     History   Chief Complaint Chief Complaint  Patient presents with  . Fever  . Abdominal Pain    HPI Abigail Ryan is a 73 y.o. female.     72 year old female presents with several days of left lower quadrant abdominal pain with associated fever up to 101.  Has had nausea but no vomiting.  No black or bloody stools.  Denies any urinary symptoms.  No cough or congestion.  Similar symptoms in the past associate with a liver abscess.  Denies any history of diverticular disease.  Symptoms were gradually worse with any movement.  Presents for further evaluation.     Past Medical History:  Diagnosis Date  . Renal stones   . Thyroid disease     Patient Active Problem List   Diagnosis Date Noted  . Type 2 diabetes mellitus with other specified complication ([place]) 30/86/5784  . Acute CVA (cerebrovascular accident) ([place]) 02/18/2019  . H/O: GI bleed 02/18/2019  . Atrial fibrillation with RVR ([place]) 01/27/2019  . Liver abscess 01/27/2019  . Hypothyroid 01/25/2019  . Sepsis ([place]) 01/25/2019  . Elevated troponin 01/25/2019    Past Surgical History:  Procedure Laterality Date  . ABDOMINAL HYSTERECTOMY    . APPENDECTOMY    . CHOLECYSTECTOMY       OB History   No obstetric history on file.      Home Medications    Prior to Admission medications   Medication Sig Start Date End Date Taking? Authorizing Provider  acetaminophen (TYLENOL) 500 MG tablet Take 1,000 mg by mouth every 6 (six) hours as needed for moderate pain or fever.    [provider]  apixaban (ELIQUIS) 5 MG TABS tablet Take 1 tablet (5 mg total) by mouth 2 (two) times daily for 30 days. 02/19/19 03/21/19  Elodia Florence., MD  artificial tears (LACRILUBE) OINT ophthalmic ointment Place into the left eye at bedtime as needed for dry eyes. 02/19/19   Elodia Florence., MD   atorvastatin (LIPITOR) 40 MG tablet Take 1 tablet (40 mg total) by mouth daily at 6 PM for 30 days. 02/19/19 03/21/19  Elodia Florence., MD  benzonatate (TESSALON) 200 MG capsule Take 200 mg by mouth 3 (three) times daily as needed for cough.  02/03/19   [provider]  blood glucose meter kit and supplies KIT Dispense based on patient and insurance preference. Use up to four times daily as directed. (FOR ICD-9 250.00, 250.01). 02/19/19   Elodia Florence., MD  cholecalciferol (VITAMIN D) 1000 UNITS tablet Take 1,500 Units by mouth daily.    [provider]  diltiazem (CARDIZEM CD) 180 MG 24 hr capsule Take 1 capsule (180 mg total) by mouth daily. 02/01/19   [place] Donnamarie Poag, DO  levothyroxine (SYNTHROID, LEVOTHROID) 150 MCG tablet Take 150 mcg by mouth at bedtime.    [provider]  metFORMIN (GLUCOPHAGE) 500 MG tablet Take 2 tablets (1,000 mg total) by mouth 2 (two) times daily with a meal for 30 days. (uptitrate per discharge instructions) 02/19/19 03/21/19  Elodia Florence., MD  metoprolol tartrate (LOPRESSOR) 50 MG tablet Take 1 tablet (50 mg total) by mouth 2 (two) times daily. 02/01/19   [place] Donnamarie Poag, DO  polyvinyl alcohol (LIQUIFILM TEARS) 1.4 % ophthalmic solution Place 1 drop into the left eye as needed for dry eyes.  02/19/19   Elodia Florence., MD  traMADol (ULTRAM) 50 MG tablet Take 1 tablet (50 mg total) by mouth every 12 (twelve) hours as needed for severe pain. Patient not taking: Reported on 02/10/2019 08/16/15   Everlene Balls, MD  UNABLE TO FIND Med Name:  Immune protect supplement    [provider]  UNABLE TO FIND Med Name: Cardio BP supplement    [provider]  UNABLE TO FIND Med Name: Probiotic Complete Supplement    [provider]    Family History Family History  Problem Relation Age of Onset  . Breast cancer Daughter   . Heart disease Mother 30  . Heart disease Father 24    Social History Social  History   Tobacco Use  . Smoking status: Never Smoker  . Smokeless tobacco: Never Used  Substance Use Topics  . Alcohol use: No  . Drug use: No     Allergies   Codeine and Morphine and related   Review of Systems Review of Systems  All other systems reviewed and are negative.    Physical Exam Updated Vital Signs BP (!) 151/78 (BP Location: Left Arm)   Pulse 87   Temp 99.4 F (37.4 C) (Oral)   Resp 18   Ht 1.664 m (5' 5.5")   Wt 99.8 kg   SpO2 96%   BMI 36.05 kg/m   Physical Exam Vitals signs and nursing note reviewed.  Constitutional:      General: She is not in acute distress.    Appearance: Normal appearance. She is well-developed. She is not toxic-appearing.  HENT:     Head: Normocephalic and atraumatic.  Eyes:     General: Lids are normal.     Conjunctiva/sclera: Conjunctivae normal.     Pupils: Pupils are equal, round, and reactive to light.  Neck:     Musculoskeletal: Normal range of motion and neck supple.     Thyroid: No thyroid mass.     Trachea: No tracheal deviation.  Cardiovascular:     Rate and Rhythm: Normal rate and regular rhythm.     Heart sounds: Normal heart sounds. No murmur. No gallop.   Pulmonary:     Effort: Pulmonary effort is normal. No respiratory distress.     Breath sounds: Normal breath sounds. No stridor. No decreased breath sounds, wheezing, rhonchi or rales.  Abdominal:     General: Bowel sounds are normal. There is no distension.     Palpations: Abdomen is soft.     Tenderness: There is abdominal tenderness in the left lower quadrant. There is no guarding or rebound.    Musculoskeletal: Normal range of motion.        General: No tenderness.  Skin:    General: Skin is warm and dry.     Findings: No abrasion or rash.  Neurological:     Mental Status: She is alert and oriented to person, place, and time.     GCS: GCS eye subscore is 4. GCS verbal subscore is 5. GCS motor subscore is 6.     Cranial Nerves: No cranial  nerve deficit.     Sensory: No sensory deficit.  Psychiatric:        Speech: Speech normal.        Behavior: Behavior normal.      ED Treatments / Results  Labs (all labs ordered are listed, but only abnormal results are displayed) Labs Reviewed  LIPASE, BLOOD  COMPREHENSIVE METABOLIC PANEL  CBC  URINALYSIS, ROUTINE W REFLEX MICROSCOPIC    EKG None  Radiology No results found.  Procedures Procedures (including critical care time)  Medications Ordered in ED Medications  sodium chloride flush (NS) 0.9 % injection 3 mL (has no administration in time range)     Initial Impression / Assessment and Plan / ED Course  I have reviewed the triage vital signs and the nursing notes.  Pertinent labs & imaging results that were available during my care of the patient were reviewed by me and considered in my medical decision making (see chart for details).        Pt medicated for pain Labs and abd ct pending On coming edp to dispo   Final Clinical Impressions(s) / ED Diagnoses   Final diagnoses:  None    ED Discharge Orders    None       Lacretia Leigh, MD 10/25/19 [phone]

## 2019-10-20 NOTE — ED Provider Notes (Signed)
Ct Abdomen Pelvis W Contrast  Result Date: 10/20/2019 CLINICAL DATA:  73 year old female with abdominal distension and intermittent left lower quadrant pain. EXAM: CT ABDOMEN AND PELVIS WITH CONTRAST TECHNIQUE: Multidetector CT imaging of the abdomen and pelvis was performed using the standard protocol following bolus administration of intravenous contrast. CONTRAST:  112mL OMNIPAQUE IOHEXOL 300 MG/ML  SOLN COMPARISON:  CT abdomen pelvis dated 08/24/2019. FINDINGS: Lower chest: The visualized lung bases are clear. Small fluid attenuating structure along the inferior aspect of the posterior pericardial region appears similar to prior CT and may represent a pericardial cyst or fluid in the pericardial recess. No intra-abdominal free air or free fluid. Hepatobiliary: Probable background of fatty liver and changes of early cirrhosis. Subcentimeter left hepatic hypodense focus is too small to characterize. There is mild intrahepatic biliary ductal dilatation as well as mild dilatation of the CBD, post cholecystectomy. No retained calcified stone noted the central CBD. Pancreas: Unremarkable. No pancreatic ductal dilatation or surrounding inflammatory changes. Spleen: Normal in size without focal abnormality. Adrenals/Urinary Tract: The adrenal glands are unremarkable. The kidneys, visualized ureters, and urinary bladder appear unremarkable. There is symmetric enhancement and excretion of contrast by both kidneys. Stomach/Bowel: There is sigmoid diverticulosis with muscular hypertrophy. Mild thickened appearance of the descending and sigmoid colon may be combination of muscular hypertrophy and underdistention. Mild colitis is not excluded. Clinical correlation is recommended. No evidence of acute diverticulitis. Loose stool noted in the proximal colon. There is no bowel obstruction. The appendix is not visualized with certainty. No inflammatory changes identified in the right lower quadrant. Vascular/Lymphatic:  Advanced aortoiliac atherosclerotic disease. The IVC is unremarkable. No portal venous gas. There is no adenopathy. Reproductive: Hysterectomy. No adnexal masses. Other: Small fat containing umbilical hernia. Musculoskeletal: Osteopenia with degenerative changes of the spine. No acute osseous pathology. IMPRESSION: 1. Sigmoid diverticulosis with muscular hypertrophy. Mild thickened appearance of the descending and sigmoid colon may be combination of muscular hypertrophy and underdistention. Mild colitis is not excluded. Clinical correlation is recommended. No bowel obstruction. 2. Probable background of fatty liver and changes of early cirrhosis. 3. Aortic atherosclerosis. Aortic Atherosclerosis (ICD10-I70.0). Electronically Signed   By: Anner Crete M.D.   On: 10/20/2019 17:11   Patient was seen by Dr. Zenia Resides.  Please see his notes.  Plan was to follow-up on the CT scan.  CT scan does not show any evidence of abscess or diverticulitis.  There is possible mild colitis.  Patient has been having discomfort in the left lower quadrant.  Plan on course of antibiotics to treat possible infectious colitis.  Findings and plan were discussed with the patient.  She is comfortable with discharge.   Dorie Rank, MD 10/20/19 [phone]

## 2019-11-22 ENCOUNTER — Other Ambulatory Visit: Payer: Self-pay | Admitting: Family Medicine

## 2019-11-22 DIAGNOSIS — R1032 Left lower quadrant pain: Secondary | ICD-10-CM

## 2020-04-23 IMAGING — MR MR ABDOMEN WO/W CM
9 of 18 series · 21 of 48 positions shown · IV contrast (gadavist)
Comparison: CT 01/26/2019, 08/16/2015

CLINICAL DATA: Evaluate possible liver abscess. Fever. Abdominal
tenderness

EXAM:
MRI ABDOMEN WITHOUT AND WITH CONTRAST
TECHNIQUE: Multiplanar multisequence MR imaging of the abdomen was performed
both before and after the administration of intravenous contrast.
CONTRAST:  10 mL Gadavist

[Series 3: T2 fat-sat · axial · 5.0mm · 0.94mm/px · z∈[-121,+184]mm · 2 of 62 slices shown]
[im 1/62]
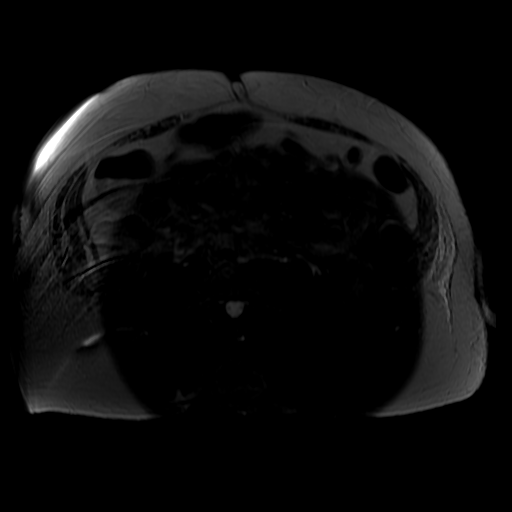
[im 62/62]
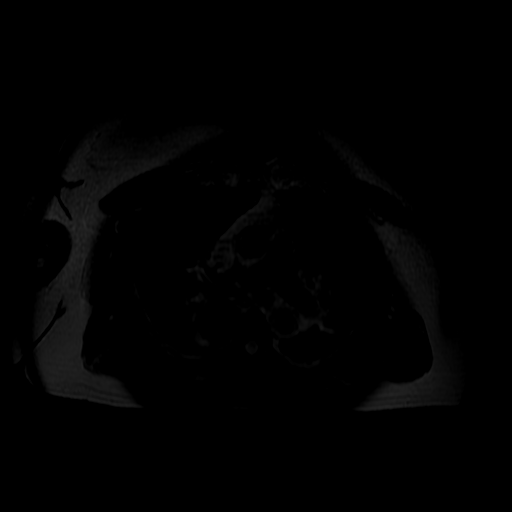

[Series 5: DWI b500 · axial · 6.0mm · 1.88mm/px · z∈[-102,+179]mm · 2 of 73 slices shown]
[im 1/73]
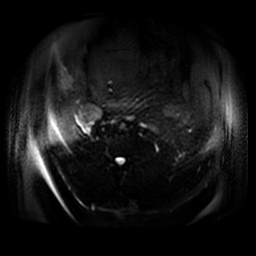
[im 73/73]
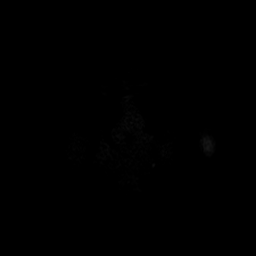

[Series 6: T2 · axial · 5.0mm · 0.94mm/px · z∈[-121,+184]mm · 2 of 62 slices shown (1 of 2)]
[im 1/62]
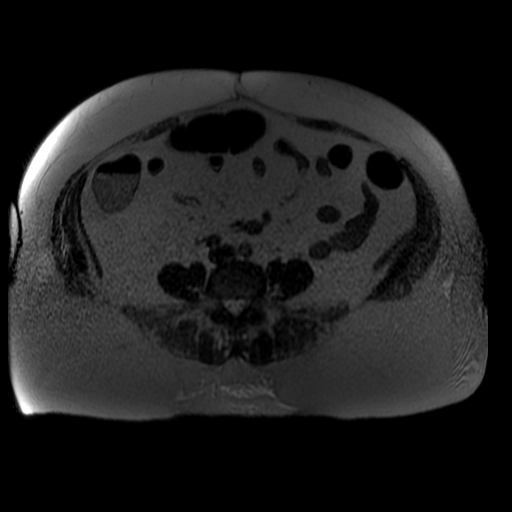
[im 62/62]
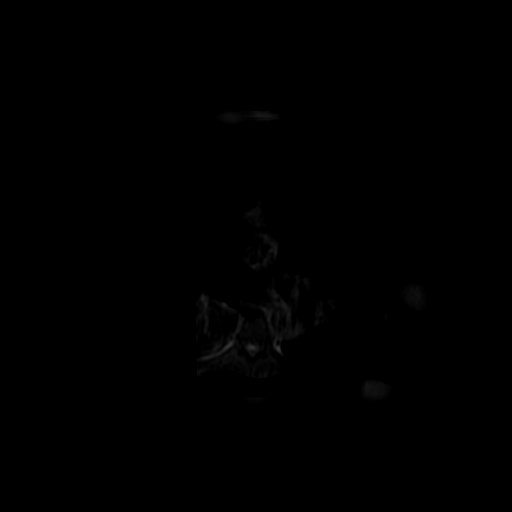

[Series 7: T2 · coronal · 5.0mm · 0.86mm/px · 2 of 56 slices shown (2 of 2)]
[im 1/56]
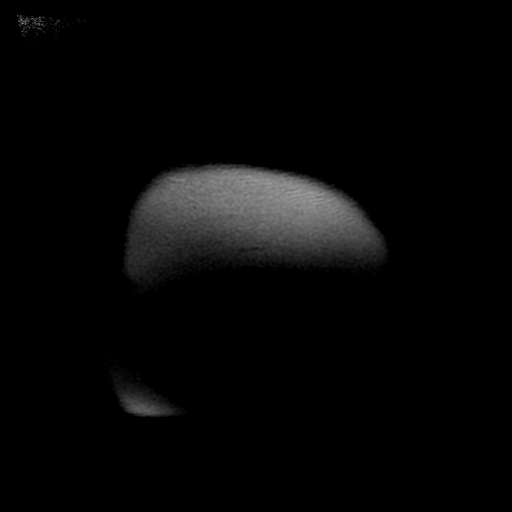
[im 56/56]
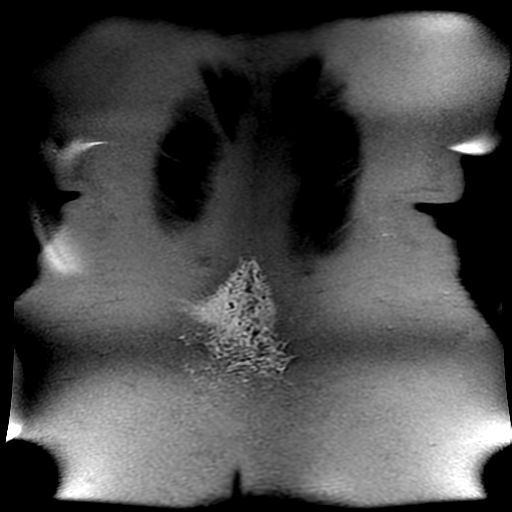

[Series 8: bSSFP · axial · 5.0mm · 0.94mm/px · z∈[-121,+184]mm · 2 of 62 slices shown]
[im 1/62]
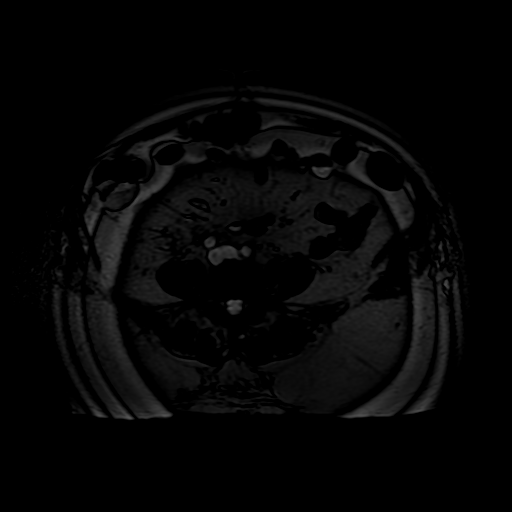
[im 62/62]
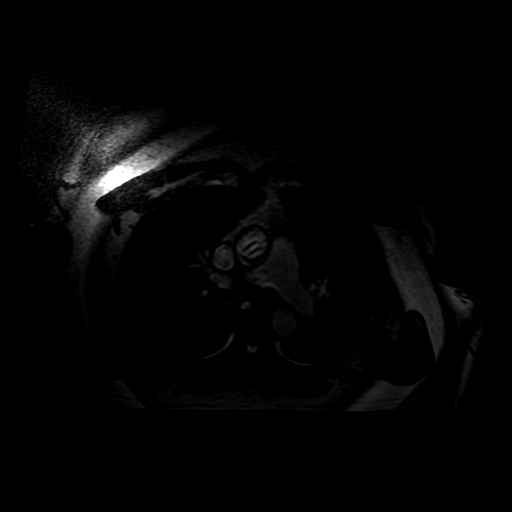

[Series 9: ax dualecho bh · axial · 5.0mm · 0.94mm/px · z∈[-121,+184]mm · 4 of 124 slices shown]
[im 1/124]
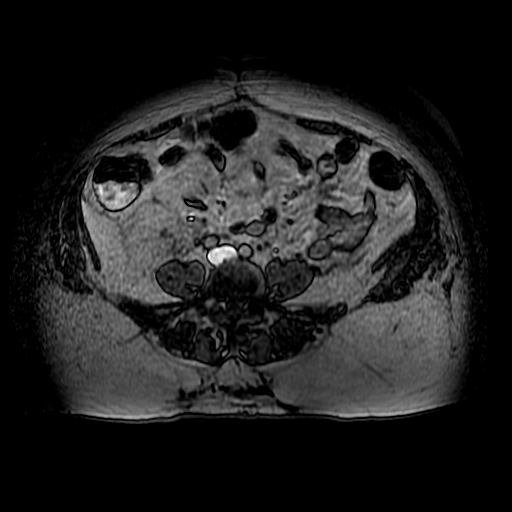
[im 42/124]
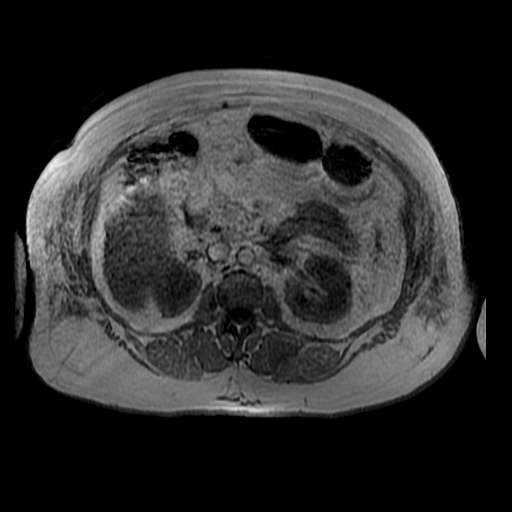
[im 83/124]
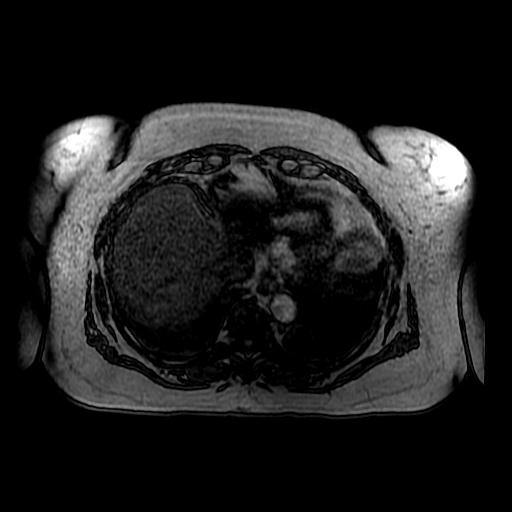
[im 124/124]
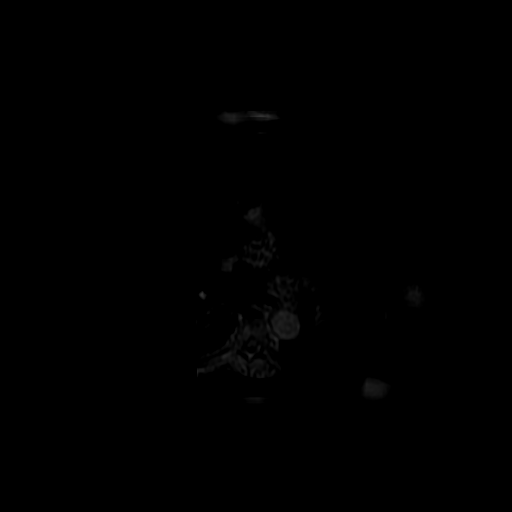

[Series 500: DWI · axial · 6.0mm · 1.88mm/px · 1 of 37 slices shown]
[im 1/37]
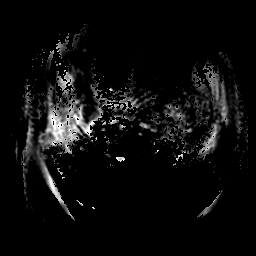

[Series 1000: T1 dynamic · axial · 6.0mm · 0.94mm/px · z∈[-80,+181]mm · 3 of 88 slices shown (1 of 2)]
[im 1/88]
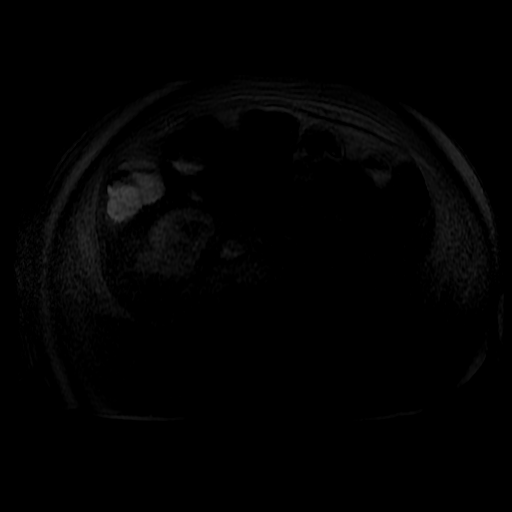
[im 44/88]
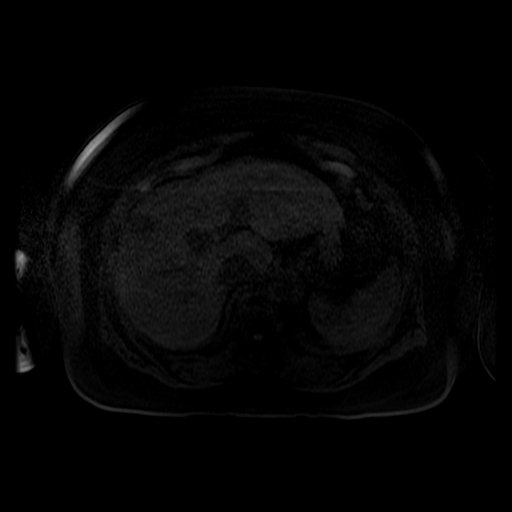
[im 88/88]
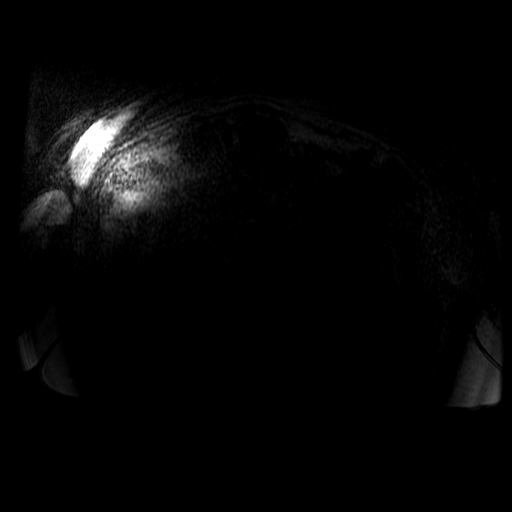

[Series 1001: T1 dynamic · axial · 6.0mm · 0.94mm/px · z∈[-80,+181]mm · 3 of 88 slices shown (2 of 2)]
[im 1/88]
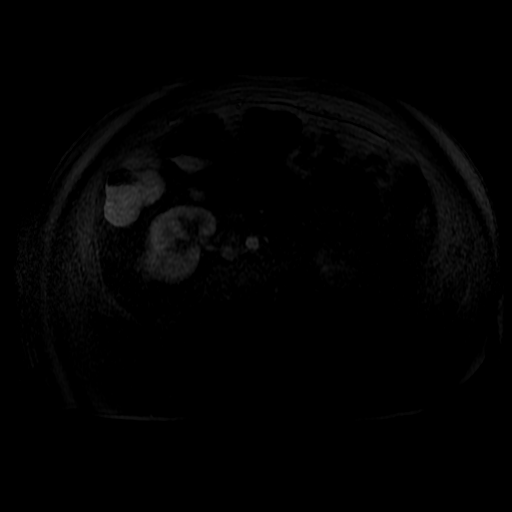
[im 44/88]
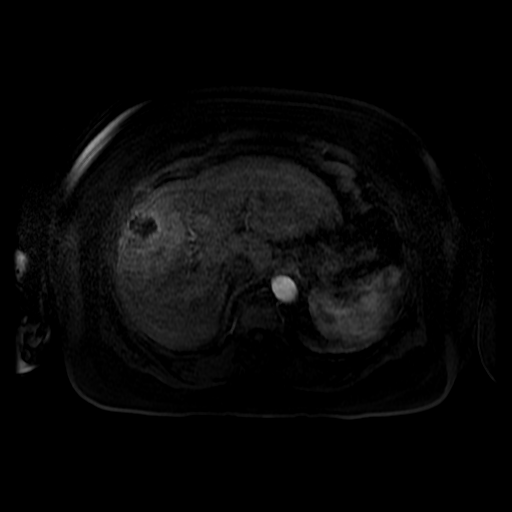
[im 88/88]
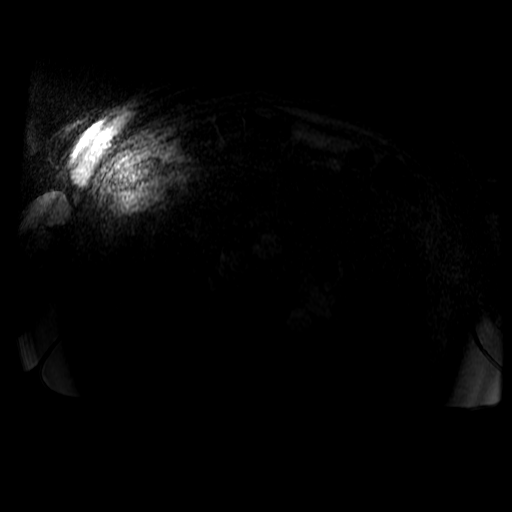

[21 of 48 positions shown; findings below may reference images not displayed]

FINDINGS: Lower chest:  Lung bases are clear.

Hepatobiliary: Within the RIGHT hepatic lobe, there is a rounded
lesion which is mixed high signal intensity on T2 weighted imaging
measuring 5.0 x 4.8 cm (image [DATE]). There is a rim of more diffuse
high signal intensity around the lesion suggesting edema within the
liver parenchyma. On precontrast T1 weighted imaging lesion is
hypointense. On postcontrast imaging there is thin peripheral
enhancing rim and enhancing internal septations. The majority of the
internal aspect of the lesion is nonenhancing (image series 1111 and
4779). The peripheral rim is mildly enhancing.

On diffusion-weighted imaging, lesion is hyperintense and mildly
hypointense on the ADC map.

No biliary duct dilatation. Postcholecystectomy. Normal common bile
duct.

Pancreas: Normal pancreatic parenchymal intensity. No ductal
dilatation or inflammation.

Spleen: Normal spleen.

Adrenals/urinary tract: Adrenal glands and kidneys are normal.

Stomach/Bowel: Stomach and limited of the small bowel is
unremarkable

Vascular/Lymphatic: Abdominal aortic normal caliber. No
retroperitoneal periportal lymphadenopathy.

Musculoskeletal: No aggressive osseous lesion
IMPRESSION: Round septated lesion in the RIGHT hepatic lobe with peripheral rim
of enhancing edema is most consistent with a HEPATIC ABSCESS. Less
favored differential would include a cystic neoplasm or necrotic
lesion such is biliary cystadenoma, mucinous lesion, or necrotic
metastasis.

## 2020-09-28 NOTE — Progress Notes (Signed)
Patient referred by Leonard Downing, * for arrhtymia  Subjective:   Abigail Ryan, female    DOB: [birth year], 74 y.o.   MRN: [id number]   Chief Complaint  Patient presents with  . Irregular Heart Beat  . New Patient (Initial Visit)     HPI  74 y.o. Caucasian female with hypertension, type 2 diabetes mellitus, obesity, paroxysmal atrial fibrillation.  Patient lives in [place], [place] with her husband with severe dementia for which he is the primary caregiver.  She has significant caregiver burden related to lung taking care of her husband.  She finds sulisobenzone activity such as gardening.  Patient diagnosed with paroxysmal atrial fibrillation during her hospitalization with subcentimeter abscess and 01/2019.  She was recommended anticoagulation.  However, she has not been on anticoagulation ever since she was discharged from the hospital again.  She denies any palpitation symptoms.  She does have dyspnea with usual activity, with orthopnea.  She denies any chest pain, leg edema.  She is currently taking Lasix every other day without much improvement in her shortness of breath symptoms.  Recent labs reviewed with the patient, details below. Reviewed and independently interpreted prior cardiac testing.      Past Medical History:  Diagnosis Date  . Renal stones   . Thyroid disease      Past Surgical History:  Procedure Laterality Date  . ABDOMINAL HYSTERECTOMY    . APPENDECTOMY    . CHOLECYSTECTOMY       Social History   Tobacco Use  Smoking Status Never Smoker  Smokeless Tobacco Never Used    Social History   Substance and Sexual Activity  Alcohol Use No     Family History  Problem Relation Age of Onset  . Breast cancer Daughter   . Heart disease Mother 91  . Heart disease Father 7     Current Outpatient Medications on File Prior to Visit  Medication Sig Dispense Refill  . blood glucose meter kit and supplies KIT Dispense based on patient and  insurance preference. Use up to four times daily as directed. (FOR ICD-9 250.00, 250.01). 1 each 0  . furosemide (LASIX) 20 MG tablet Take 1 tablet by mouth every other day.    . ibuprofen (ADVIL) 200 MG tablet Take 200 mg by mouth every 6 (six) hours as needed for fever, headache or moderate pain.    Marland Kitchen levothyroxine (SYNTHROID, LEVOTHROID) 150 MCG tablet Take 150 mcg by mouth at bedtime.    Marland Kitchen OVER THE COUNTER MEDICATION Take 1 tablet by mouth daily. Urinary maintance 1 po Super antioxidant 1 po Cardio bp 1 po Immune protect 1 po Sugar reg 1 po Krill omega 1 po Daily spectrum 1 po Brain elevate 1 po Vitamin d 3 1 po b12 1 po Calcium 1 po Vitamin c 1 po     No current facility-administered medications on file prior to visit.    Cardiovascular and other pertinent studies:  EKG 09/29/2020: Sinus rhythm 79 bpm Low voltage in precordial leads Otherwise normal EKG    Recent labs: 09/23/2020: Glucose 114, BUN/Cr 15/1.0. EGFR 55. Na/K 139/4.8. Rest of the CMP normal H/H 13.3/42.0. MCV 92.5. Platelets 205 HbA1C 6.9 % Chol 264, TG 76, HDL 54, LDL 195 TSH 2.03 normal  02/2020: Chol 242, TG 83, HDL 60, LDL 165    Review of Systems  Cardiovascular: Positive for dyspnea on exertion. Negative for chest pain, leg swelling, palpitations and syncope.  Vitals:   09/29/20 1339  BP: (!) 149/88  Pulse: 72  Resp: 16  SpO2: 96%     Body mass index is 42.1 kg/m. Filed Weights   09/29/20 1339  Weight: 253 lb (114.8 kg)     Objective:   Physical Exam Vitals and nursing note reviewed.  Constitutional:      General: She is not in acute distress. Neck:     Vascular: No JVD.  Cardiovascular:     Rate and Rhythm: Normal rate and regular rhythm. Occasional extrasystoles are present.    Pulses: Normal pulses.     Heart sounds: Normal heart sounds. No murmur heard.   Pulmonary:     Effort: Pulmonary effort is normal.     Breath sounds: Normal breath sounds. No  wheezing or rales.          Assessment & Recommendations:   74 y.o. Caucasian female with hypertension, type 2 diabetes mellitus, obesity, paroxysmal atrial fibrillation.  Paroxysmal A. Fib: Diagnosed during echo admission for sepsis in February 2020.  She has not had any subsequent EKGs.  She has not been on anticoagulation.  She states that she feels poorly when her heart rate was in the 60s. Recommend echocardiogram due to being on the monitor. CHA2DS2VASc score 4, annual stroke risk 4%. Given that she has not been on anticoagulation all this well, she would like to continue holding off for now.  She understands related stroke risks.  HFpEF: NYHA class II-III.  Currently on Lasix every other day.  Added spironolactone 25 mg daily.  Repeat BMP in 1 week.  Counseled regarding salt restriction diet.  Hyperlipidemia: LDL 195.  She is reluctant to start statin.  After much discussion, she is agreed to start rosuvastatin 10 mg daily.  I have discussed with patient regarding the safety during COVID Pandemic and steps and precautions to be taken including social distancing, frequent hand wash and use of detergent soap, gels with the patient. I asked the patient to avoid touching mouth, nose, eyes, ears with her hands. I encouraged heart healthy diet, regular exercise- as long as social distancing can be maintained.  Patient remains reluctant to take COVID vaccine.     Thank you for referring the patient to [place]. Please feel free to contact with any questions.   Nigel Mormon, MD Pager: [phone] Office: [phone]

## 2020-09-29 ENCOUNTER — Encounter: Payer: Self-pay | Admitting: Cardiology

## 2020-09-29 ENCOUNTER — Ambulatory Visit: Payer: Medicare Other | Admitting: Cardiology

## 2020-09-29 ENCOUNTER — Other Ambulatory Visit: Payer: Self-pay

## 2020-09-29 VITALS — BP 149/88 | HR 72 | Resp 16 | Ht 65.0 in | Wt 253.0 lb

## 2020-09-29 DIAGNOSIS — R001 Bradycardia, unspecified: Secondary | ICD-10-CM | POA: Insufficient documentation

## 2020-09-29 DIAGNOSIS — I5032 Chronic diastolic (congestive) heart failure: Secondary | ICD-10-CM | POA: Insufficient documentation

## 2020-09-29 DIAGNOSIS — Z7189 Other specified counseling: Secondary | ICD-10-CM | POA: Insufficient documentation

## 2020-09-29 DIAGNOSIS — I480 Paroxysmal atrial fibrillation: Secondary | ICD-10-CM | POA: Insufficient documentation

## 2020-09-29 DIAGNOSIS — E782 Mixed hyperlipidemia: Secondary | ICD-10-CM

## 2020-09-29 MED ORDER — SPIRONOLACTONE 25 MG PO TABS: 25.0000 mg | ORAL_TABLET | Freq: Every day | ORAL | 3 refills | Status: AC

## 2020-09-29 MED ORDER — ROSUVASTATIN CALCIUM 10 MG PO TABS: 10.0000 mg | ORAL_TABLET | Freq: Every day | ORAL | 3 refills | Status: DC

## 2020-10-05 ENCOUNTER — Other Ambulatory Visit: Payer: Medicare Other

## 2020-11-08 ENCOUNTER — Ambulatory Visit: Payer: Medicare Other | Admitting: Cardiology

## 2021-06-17 ENCOUNTER — Emergency Department (HOSPITAL_COMMUNITY)
Admission: EM | Admit: 2021-06-17 | Discharge: 2021-06-18 | Disposition: A | Discharge status: Home / Self Care | Payer: Medicare Other | Attending: Emergency Medicine | Admitting: Emergency Medicine

## 2021-06-17 ENCOUNTER — Other Ambulatory Visit: Payer: Self-pay

## 2021-06-17 DIAGNOSIS — I480 Paroxysmal atrial fibrillation: Secondary | ICD-10-CM | POA: Insufficient documentation

## 2021-06-17 DIAGNOSIS — B349 Viral infection, unspecified: Secondary | ICD-10-CM | POA: Diagnosis not present

## 2021-06-17 DIAGNOSIS — E119 Type 2 diabetes mellitus without complications: Secondary | ICD-10-CM | POA: Insufficient documentation

## 2021-06-17 DIAGNOSIS — Z28310 Unvaccinated for covid-19: Secondary | ICD-10-CM | POA: Insufficient documentation

## 2021-06-17 DIAGNOSIS — U071 COVID-19: Secondary | ICD-10-CM | POA: Diagnosis not present

## 2021-06-17 DIAGNOSIS — R509 Fever, unspecified: Secondary | ICD-10-CM | POA: Diagnosis present

## 2021-06-17 NOTE — ED Triage Notes (Signed)
[facility] from home. Pt complaining of fever. 101.3 oral with EMS. No other symptoms. Pt is not vaccinated for covid and does not know if she has been exposed.

## 2021-06-18 LAB — SARS CORONAVIRUS 2 (TAT 6-24 HRS): SARS Coronavirus 2: POSITIVE — AB

## 2021-06-18 NOTE — Discharge Instructions (Addendum)
You were evaluated in the Emergency Department and after careful evaluation, we did not find any emergent condition requiring admission or further testing in the hospital.  Your exam/testing today is overall reassuring.  Symptoms seem to be due to a viral illness, possibly COVID-19.  We have tested you for the coronavirus here in the Emergency Department.  Please isolate or quarantine at home until you receive a negative test result.  If positive, we recommend continued home quarantine per [facility] recommendations.   Please return to the Emergency Department if you experience any worsening of your condition.   Thank you for allowing [place] to be a part of your care.

## 2021-06-18 NOTE — ED Provider Notes (Signed)
[facility]-EMERGENCY DEPT [facility] Emergency Department Provider Note MRN:  [id number]  Arrival date & time: 06/18/21     Chief Complaint   Fever   History of Present Illness   Abigail Ryan is a 75 y.o. year-old female with a history of paroxysmal A. fib, diabetes presenting to the ED with chief complaint of fever.  Fever starting today with nasal congestion, cough.  Denies sore throat.  Having a mild dull frontal headache as well.  Denies any chest pain or shortness of breath, no abdominal pain, no burning with urination.  Symptoms constant, moderate, no exacerbating or alleviating factors.  Review of Systems  A complete 10 system review of systems was obtained and all systems are negative except as noted in the HPI and PMH.   Patient's Health History    Past Medical History:  Diagnosis Date   Renal stones    Thyroid disease     Past Surgical History:  Procedure Laterality Date   ABDOMINAL HYSTERECTOMY     APPENDECTOMY     CHOLECYSTECTOMY      Family History  Problem Relation Age of Onset   Breast cancer Daughter    Heart disease Mother 58   Heart disease Father 8    Social History   Socioeconomic History   Marital status: Married    Spouse name: Not on file   Number of children: Not on file   Years of education: Not on file   Highest education level: Not on file  Occupational History   Occupation: Retired  Tobacco Use   Smoking status: Never   Smokeless tobacco: Never  Vaping Use   Vaping Use: Never used  Substance and Sexual Activity   Alcohol use: No   Drug use: No   Sexual activity: Not on file  Other Topics Concern   Not on file  Social History Narrative   Lives in [place] w/ husband, who has Alzheimer's   Social Determinants of Health   Financial Resource Strain: Not on file  Food Insecurity: Not on file  Transportation Needs: Not on file  Physical Activity: Not on file  Stress: Not on file  Social Connections: Not on file  Intimate  Partner Violence: Not on file     Physical Exam   Vitals:   06/17/21 2311  BP: (!) 148/59  Pulse: 88  Resp: 18  Temp: 98.9 F (37.2 C)  SpO2: 97%    CONSTITUTIONAL: Well-appearing, NAD NEURO:  Alert and oriented x 3, no focal deficits EYES:  eyes equal and reactive ENT/NECK:  no LAD, no JVD CARDIO: Regular rate, well-perfused, normal S1 and S2 PULM:  CTAB no wheezing or rhonchi GI/GU:  normal bowel sounds, non-distended, non-tender MSK/SPINE:  No gross deformities, no edema SKIN:  no rash, atraumatic PSYCH:  Appropriate speech and behavior  *Additional and/or pertinent findings included in MDM below  Diagnostic and Interventional Summary    EKG Interpretation  Date/Time:    Ventricular Rate:    PR Interval:    QRS Duration:   QT Interval:    QTC Calculation:   R Axis:     Text Interpretation:          Labs Reviewed  SARS CORONAVIRUS 2 (TAT 6-24 HRS)    No orders to display    Medications - No data to display   Procedures  /  Critical Care Procedures  ED Course and Medical Decision Making  I have reviewed the triage vital signs, the nursing notes, and  pertinent available records from the EMR.  Listed above are laboratory and imaging tests that I personally ordered, reviewed, and interpreted and then considered in my medical decision making (see below for details).  Suspect viral upper respiratory infection.  Possibly COVID-19.  Not vaccinated.  Patient is with no increased work of breathing, normal vital signs, no hypoxia, lungs are clear, no indication for further testing or imaging.  History of liver abscess but no abdominal pain or tenderness.  Appropriate for discharge, will swab for COVID.  Advised PCP follow-up if positive test to discuss management.       Elmer Sow. Pilar Plate, MD [facility] Emergency Medicine [facility] [email or web address]  Final Clinical Impressions(s) / ED Diagnoses     ICD-10-CM   1. Viral illness  B34.9        ED Discharge Orders     None        Discharge Instructions Discussed with and Provided to Patient:    Discharge Instructions      You were evaluated in the Emergency Department and after careful evaluation, we did not find any emergent condition requiring admission or further testing in the hospital.  Your exam/testing today is overall reassuring.  Symptoms seem to be due to a viral illness, possibly COVID-19.  We have tested you for the coronavirus here in the Emergency Department.  Please isolate or quarantine at home until you receive a negative test result.  If positive, we recommend continued home quarantine per [facility] recommendations.   Please return to the Emergency Department if you experience any worsening of your condition.   Thank you for allowing [place] to be a part of your care.       Sabas Sous, MD 06/18/21 [phone]

## 2023-02-02 ENCOUNTER — Emergency Department (HOSPITAL_COMMUNITY)
Admission: EM | Admit: 2023-02-02 | Discharge: 2023-02-03 | Disposition: A | Discharge status: Home / Self Care | Payer: Medicare Other | Attending: Student | Admitting: Student

## 2023-02-02 ENCOUNTER — Emergency Department (HOSPITAL_COMMUNITY): Payer: Medicare Other

## 2023-02-02 ENCOUNTER — Other Ambulatory Visit: Payer: Self-pay

## 2023-02-02 DIAGNOSIS — M7989 Other specified soft tissue disorders: Secondary | ICD-10-CM | POA: Insufficient documentation

## 2023-02-02 DIAGNOSIS — I509 Heart failure, unspecified: Secondary | ICD-10-CM | POA: Diagnosis not present

## 2023-02-02 DIAGNOSIS — R635 Abnormal weight gain: Secondary | ICD-10-CM | POA: Diagnosis present

## 2023-02-02 DIAGNOSIS — Z79899 Other long term (current) drug therapy: Secondary | ICD-10-CM | POA: Insufficient documentation

## 2023-02-02 DIAGNOSIS — R0602 Shortness of breath: Secondary | ICD-10-CM | POA: Diagnosis not present

## 2023-02-02 DIAGNOSIS — R221 Localized swelling, mass and lump, neck: Secondary | ICD-10-CM | POA: Insufficient documentation

## 2023-02-02 DIAGNOSIS — R0609 Other forms of dyspnea: Secondary | ICD-10-CM | POA: Diagnosis not present

## 2023-02-02 DIAGNOSIS — E039 Hypothyroidism, unspecified: Secondary | ICD-10-CM | POA: Insufficient documentation

## 2023-02-02 DIAGNOSIS — E119 Type 2 diabetes mellitus without complications: Secondary | ICD-10-CM | POA: Insufficient documentation

## 2023-02-02 LAB — CBC WITH DIFFERENTIAL/PLATELET
Abs Immature Granulocytes: 0.02 10*3/uL (ref 0.00–0.07)
Basophils Absolute: 0 10*3/uL (ref 0.0–0.1)
Basophils Relative: 1 %
Eosinophils Absolute: 0.2 10*3/uL (ref 0.0–0.5)
Eosinophils Relative: 3 %
HCT: 43.1 % (ref 36.0–46.0)
Hemoglobin: 13.8 g/dL (ref 12.0–15.0)
Immature Granulocytes: 0 %
Lymphocytes Relative: 25 %
Lymphs Abs: 2 10*3/uL (ref 0.7–4.0)
MCH: 29.8 pg (ref 26.0–34.0)
MCHC: 32 g/dL (ref 30.0–36.0)
MCV: 93.1 fL (ref 80.0–100.0)
Monocytes Absolute: 0.5 10*3/uL (ref 0.1–1.0)
Monocytes Relative: 7 %
Neutro Abs: 5.1 10*3/uL (ref 1.7–7.7)
Neutrophils Relative %: 64 %
Platelets: 219 10*3/uL (ref 150–400)
RBC: 4.63 MIL/uL (ref 3.87–5.11)
RDW: 15.1 % (ref 11.5–15.5)
WBC: 7.9 10*3/uL (ref 4.0–10.5)
nRBC: 0 % (ref 0.0–0.2)

## 2023-02-02 LAB — BASIC METABOLIC PANEL
Anion gap: 9 (ref 5–15)
BUN: 17 mg/dL (ref 8–23)
CO2: 25 mmol/L (ref 22–32)
Calcium: 9.2 mg/dL (ref 8.9–10.3)
Chloride: 102 mmol/L (ref 98–111)
Creatinine, Ser: 1.54 mg/dL — ABNORMAL HIGH (ref 0.44–1.00)
GFR, Estimated: 34 mL/min — ABNORMAL LOW (ref >60)
Glucose, Bld: 99 mg/dL (ref 70–99)
Potassium: 4.6 mmol/L (ref 3.5–5.1)
Sodium: 136 mmol/L (ref 135–145)

## 2023-02-02 LAB — TSH: TSH: 58.165 u[IU]/mL — ABNORMAL HIGH (ref 0.350–4.500)

## 2023-02-02 MED ORDER — LACTATED RINGERS IV BOLUS: 1000.0000 mL | Freq: Once | INTRAVENOUS | Status: DC

## 2023-02-02 MED ORDER — LACTATED RINGERS IV BOLUS
500.0000 mL | Freq: Once | INTRAVENOUS | Status: AC
  Administered 2023-02-02: 500 mL via INTRAVENOUS

## 2023-02-02 NOTE — ED Provider Notes (Signed)
[facility] Provider Note  CSN: [id number] Arrival date & time: 02/02/23 2143  Chief Complaint(s) Sore Throat  HPI Abigail Ryan is a 77 y.o. female with PMH hypothyroidism, paroxysmal A-fib, T2DM, previous CVA, previous hepatic abscess who presents emergency department for evaluation of shortness of breath and neck swelling.  Patient states that she was apparently in a disagreement with her physician about the frequency of lab draws for her thyroid (twice yearly), and because of this, her physician will not fill her Synthroid anymore.  She states that she has been out of her Synthroid for at least 3 months and is started to notice a swelling in her throat.  She states that over the last 3 days she has noticed progressive worsening dyspnea on exertion.  She also endorses over the last week she has noticed some swelling in her legs and has taken some as needed Lasix.  She endorses a 10 pound weight gain over the course last month.  Denies chest pain, abdominal pain, nausea, vomiting, fever or other systemic symptoms.   Past Medical History Past Medical History:  Diagnosis Date   Renal stones    Thyroid disease    Patient Active Problem List   Diagnosis Date Noted   Bradycardia 09/29/2020   Mixed hyperlipidemia 09/29/2020   Chronic heart failure with preserved ejection fraction ([place]) 09/29/2020   Paroxysmal A-fib ([place]) 09/29/2020   Educated about COVID-19 virus infection 09/29/2020   Type 2 diabetes mellitus with other specified complication ([place]) [id number]   Acute CVA (cerebrovascular accident) ([place]) 02/18/2019   H/O: GI bleed 02/18/2019   Atrial fibrillation with RVR ([place]) 01/27/2019   Liver abscess 01/27/2019   Hypothyroid 01/25/2019   Sepsis ([place]) 01/25/2019   Elevated troponin 01/25/2019   Home Medication(s) Prior to Admission medications   Medication Sig Start Date End Date Taking? Authorizing Provider  blood glucose meter kit  and supplies KIT Dispense based on patient and insurance preference. Use up to four times daily as directed. (FOR ICD-9 250.00, 250.01). 02/19/19   Elodia Florence., MD  furosemide (LASIX) 20 MG tablet Take 1 tablet by mouth every other day. 09/20/20   [provider]  ibuprofen (ADVIL) 200 MG tablet Take 200 mg by mouth every 6 (six) hours as needed for fever, headache or moderate pain.    [provider]  levothyroxine (SYNTHROID) 150 MCG tablet Take 1 tablet (150 mcg total) by mouth daily before breakfast. 02/03/23 03/05/23  Leydi Winstead, Debe Coder, MD  OVER THE COUNTER MEDICATION Take 1 tablet by mouth daily. Urinary maintance 1 po Super antioxidant 1 po Cardio bp 1 po Immune protect 1 po Sugar reg 1 po Krill omega 1 po Daily spectrum 1 po Brain elevate 1 po Vitamin d 3 1 po b12 1 po Calcium 1 po Vitamin c 1 po    [provider]  rosuvastatin (CRESTOR) 10 MG tablet Take 1 tablet (10 mg total) by mouth daily. 09/29/20 12/28/20  Patwardhan, Reynold Bowen, MD  spironolactone (ALDACTONE) 25 MG tablet Take 1 tablet (25 mg total) by mouth daily. 09/29/20 12/28/20  Nigel Mormon, MD  Past Surgical History Past Surgical History:  Procedure Laterality Date   ABDOMINAL HYSTERECTOMY     APPENDECTOMY     CHOLECYSTECTOMY     Family History Family History  Problem Relation Age of Onset   Breast cancer Daughter    Heart disease Mother 29   Heart disease Father 75    Social History Social History   Tobacco Use   Smoking status: Never   Smokeless tobacco: Never  Vaping Use   Vaping Use: Never used  Substance Use Topics   Alcohol use: No   Drug use: No   Allergies Codeine and Morphine and related  Review of Systems Review of Systems  HENT:  Positive for facial swelling.   Respiratory:  Positive for shortness of breath.    Cardiovascular:  Positive for leg swelling.    Physical Exam Vital Signs  I have reviewed the triage vital signs BP 124/78   Pulse 91   Temp 98.1 F (36.7 C)   Resp 15   SpO2 95%   Physical Exam Vitals and nursing note reviewed.  Constitutional:      General: She is not in acute distress.    Appearance: She is well-developed.  HENT:     Head: Normocephalic and atraumatic.  Eyes:     Conjunctiva/sclera: Conjunctivae normal.  Neck:     Comments: Palpable nodules in the neck Cardiovascular:     Rate and Rhythm: Normal rate and regular rhythm.     Heart sounds: No murmur heard. Pulmonary:     Effort: Pulmonary effort is normal. No respiratory distress.     Breath sounds: Normal breath sounds.  Abdominal:     Palpations: Abdomen is soft.     Tenderness: There is no abdominal tenderness.  Musculoskeletal:        General: No swelling.     Cervical back: Neck supple.  Lymphadenopathy:     Cervical: Cervical adenopathy present.  Skin:    General: Skin is warm and dry.     Capillary Refill: Capillary refill takes less than 2 seconds.  Neurological:     Mental Status: She is alert.  Psychiatric:        Mood and Affect: Mood normal.     ED Results and Treatments Labs (all labs ordered are listed, but only abnormal results are displayed) Labs Reviewed  BASIC METABOLIC PANEL - Abnormal; Notable for the following components:      Result Value   Creatinine, Ser 1.54 (*)    GFR, Estimated 34 (*)    All other components within normal limits  TSH - Abnormal; Notable for the following components:   TSH 58.165 (*)    All other components within normal limits  CBC WITH DIFFERENTIAL/PLATELET  HEPATIC FUNCTION PANEL  BRAIN NATRIURETIC PEPTIDE  T3, FREE  T4, FREE  TROPONIN I (HIGH SENSITIVITY)  TROPONIN I (HIGH SENSITIVITY)  Radiology CT Soft Tissue  Neck W Contrast  Result Date: 02/03/2023 CLINICAL DATA:  Non pulsatile neck mass EXAM: CT NECK WITH CONTRAST TECHNIQUE: Multidetector CT imaging of the neck was performed using the standard protocol following the bolus administration of intravenous contrast. RADIATION DOSE REDUCTION: This exam was performed according to the departmental dose-optimization program which includes automated exposure control, adjustment of the mA and/or kV according to patient size and/or use of iterative reconstruction technique. CONTRAST:  77m OMNIPAQUE IOHEXOL 300 MG/ML  SOLN COMPARISON:  None Available. FINDINGS: Pharynx and larynx: Normal. No mass or swelling. Salivary glands: No inflammation, mass, or stone. Thyroid: No visible thyroid tissue Lymph nodes: None enlarged or abnormal density. Vascular: Negative. Limited intracranial: Negative. Visualized orbits: Negative. Mastoids and visualized paranasal sinuses: Clear. Skeleton: No acute or aggressive process. Upper chest: Negative. Other: None. IMPRESSION: 1. No acute abnormality. 2. No visible thyroid tissue. Question prior thyroidectomy or thyroid ablation. Electronically Signed   By: KUlyses JarredM.D.   On: 02/03/2023 01:08   DG Chest 2 View  Result Date: 02/03/2023 CLINICAL DATA:  Dyspnea and weakness EXAM: CHEST - 2 VIEW COMPARISON:  01/26/2019 FINDINGS: Stable cardiomediastinal silhouette. Aortic atherosclerotic calcification. No focal consolidation, pleural effusion, or pneumothorax. No acute osseous abnormality. IMPRESSION: No active cardiopulmonary disease. Electronically Signed   By: TPlacido SouM.D.   On: 02/03/2023 00:14    Pertinent labs & imaging results that were available during my care of the patient were reviewed by me and considered in my medical decision making (see MDM for details).  Medications Ordered in ED Medications  sodium chloride (PF) 0.9 % injection (has no administration in time range)  lactated ringers bolus 500 mL (0 mLs  Intravenous Stopped 02/03/23 0121)  iohexol (OMNIPAQUE) 300 MG/ML solution 60 mL (60 mLs Intravenous Contrast Given 02/03/23 0034)                                                                                                                                     Procedures Procedures  (including critical care time)  Medical Decision Making / ED Course   This patient presents to the ED for concern of shortness of breath, fatigue, sore throat, this involves an extensive number of treatment options, and is a complaint that carries with it a high risk of complications and morbidity.  The differential diagnosis includes hypothyroidism, goiter, neck mass, viral URI, fluid overload, pneumonia, ACS  MDM: Patient seen emergency room for evaluation of multiple complaints described above.  Physical exam with palpable lymphadenopathy under the angle of the mandibles bilaterally but is otherwise unremarkable.  No appreciable wheezing or rales heard.  Laboratory evaluation with a creatinine elevation to 1.54, TSH significantly elevated at 58.165 but is otherwise unremarkable.  High-sensitivity troponin and BNP negative.  T3-T4 ordered and are pending.  Chest x-ray and CT soft tissue neck reassuringly unremarkable.  At this time in the emergency room,  patient is not hypoxic and does not appear to have significant shortness of breath.  At this time she does not meet inpatient criteria for admission but I did place a endocrinology referral and restarted the patient's Synthroid as she appears to be having symptomatic hypothyroidism secondary to being off of this medication for most 3 months.  Small fluid bolus given for mild creatinine elevation and patient discharged with outpatient follow-up   Additional history obtained: -Additional history obtained from daughter -External records from outside source obtained and reviewed including: Chart review including previous notes, labs, imaging, consultation notes   Lab  Tests: -I ordered, reviewed, and interpreted labs.   The pertinent results include:   Labs Reviewed  BASIC METABOLIC PANEL - Abnormal; Notable for the following components:      Result Value   Creatinine, Ser 1.54 (*)    GFR, Estimated 34 (*)    All other components within normal limits  TSH - Abnormal; Notable for the following components:   TSH 58.165 (*)    All other components within normal limits  CBC WITH DIFFERENTIAL/PLATELET  HEPATIC FUNCTION PANEL  BRAIN NATRIURETIC PEPTIDE  T3, FREE  T4, FREE  TROPONIN I (HIGH SENSITIVITY)  TROPONIN I (HIGH SENSITIVITY)      EKG   EKG Interpretation  Date/Time:  Sunday February 02 2023 23:58:11 EST Ventricular Rate:  72 PR Interval:  145 QRS Duration: 98 QT Interval:  469 QTC Calculation: 514 R Axis:   96 Text Interpretation: Sinus rhythm Atrial premature complex Prolonged QT interval Confirmed by Denning ([id number]) on 02/03/2023 1:31:55 AM         Imaging Studies ordered: I ordered imaging studies including chest x-ray, CT soft tissue neck I independently visualized and interpreted imaging. I agree with the radiologist interpretation   Medicines ordered and prescription drug management: Meds ordered this encounter  Medications   DISCONTD: lactated ringers bolus 1,000 mL   lactated ringers bolus 500 mL   iohexol (OMNIPAQUE) 300 MG/ML solution 60 mL   sodium chloride (PF) 0.9 % injection    Lineback, Kimberly W: cabinet override   levothyroxine (SYNTHROID) 150 MCG tablet    Sig: Take 1 tablet (150 mcg total) by mouth daily before breakfast.    Dispense:  30 tablet    Refill:  0    -I have reviewed the patients home medicines and have made adjustments as needed  Critical interventions none    Cardiac Monitoring: The patient was maintained on a cardiac monitor.  I personally viewed and interpreted the cardiac monitored which showed an underlying rhythm of: Sinus rhythm with PACs  Social Determinants of  Health:  Factors impacting patients care include: Has been off of her thyroid medication for multiple months   Reevaluation: After the interventions noted above, I reevaluated the patient and found that they have :improved  Co morbidities that complicate the patient evaluation  Past Medical History:  Diagnosis Date   Renal stones    Thyroid disease       Dispostion: I considered admission for this patient, but at this time she does not meet inpatient criteria for admission she is safe for discharge outpatient follow-up     Final Clinical Impression(s) / ED Diagnoses Final diagnoses:  Hypothyroidism, unspecified type     '@PCDICTATION'$ @    Anber Mckiver, Debe Coder, MD 02/03/23 [phone]

## 2023-02-02 NOTE — ED Notes (Signed)
No strep swab. Pt states she's no concerned for strep

## 2023-02-02 NOTE — ED Triage Notes (Signed)
Pt arrives via POV. Endorses increased throat swelling x [redacted] weeks along with SOB and weakness that started. Airway intact. States concern for thyroid. Requesting levothyroxine. Last dose in November, PCP did not relief medication.

## 2023-02-02 NOTE — ED Provider Notes (Incomplete)
[facility] Provider Note  CSN: [id number] Arrival date & time: 02/02/23 2143  Chief Complaint(s) Sore Throat  HPI Abigail Ryan is a 77 y.o. female with PMH hypothyroidism, paroxysmal A-fib, T2DM, previous CVA, previous hepatic abscess who presents emergency department for evaluation of shortness of breath and neck swelling.  Patient states that she was apparently in a disagreement with her physician about the frequency of lab draws for her thyroid (twice yearly), and because of this, her physician will not fill her Synthroid anymore.  She states that she has been out of her Synthroid for at least 3 months and is started to notice a swelling in her throat.  She states that over the last 3 days she has noticed progressive worsening dyspnea on exertion.  She also endorses over the last week she has noticed some swelling in her legs and has taken some as needed Lasix.  She endorses a 10 pound weight gain over the course last month.  Denies chest pain, abdominal pain, nausea, vomiting, fever or other systemic symptoms.   Past Medical History Past Medical History:  Diagnosis Date  . Renal stones   . Thyroid disease    Patient Active Problem List   Diagnosis Date Noted  . Bradycardia 09/29/2020  . Mixed hyperlipidemia 09/29/2020  . Chronic heart failure with preserved ejection fraction ([place]) 09/29/2020  . Paroxysmal A-fib ([place]) 09/29/2020  . Educated about COVID-19 virus infection 09/29/2020  . Type 2 diabetes mellitus with other specified complication ([place]) [id number]  . Acute CVA (cerebrovascular accident) ([place]) 02/18/2019  . H/O: GI bleed 02/18/2019  . Atrial fibrillation with RVR ([place]) 01/27/2019  . Liver abscess 01/27/2019  . Hypothyroid 01/25/2019  . Sepsis ([place]) 01/25/2019  . Elevated troponin 01/25/2019   Home Medication(s) Prior to Admission medications   Medication Sig Start Date End Date Taking? Authorizing Provider  blood  glucose meter kit and supplies KIT Dispense based on patient and insurance preference. Use up to four times daily as directed. (FOR ICD-9 250.00, 250.01). 02/19/19   Elodia Florence., MD  furosemide (LASIX) 20 MG tablet Take 1 tablet by mouth every other day. 09/20/20   [provider]  ibuprofen (ADVIL) 200 MG tablet Take 200 mg by mouth every 6 (six) hours as needed for fever, headache or moderate pain.    [provider]  levothyroxine (SYNTHROID, LEVOTHROID) 150 MCG tablet Take 150 mcg by mouth at bedtime.    [provider]  OVER THE COUNTER MEDICATION Take 1 tablet by mouth daily. Urinary maintance 1 po Super antioxidant 1 po Cardio bp 1 po Immune protect 1 po Sugar reg 1 po Krill omega 1 po Daily spectrum 1 po Brain elevate 1 po Vitamin d 3 1 po b12 1 po Calcium 1 po Vitamin c 1 po    [provider]  rosuvastatin (CRESTOR) 10 MG tablet Take 1 tablet (10 mg total) by mouth daily. 09/29/20 12/28/20  Patwardhan, Reynold Bowen, MD  spironolactone (ALDACTONE) 25 MG tablet Take 1 tablet (25 mg total) by mouth daily. 09/29/20 12/28/20  Nigel Mormon, MD  Past Surgical History Past Surgical History:  Procedure Laterality Date  . ABDOMINAL HYSTERECTOMY    . APPENDECTOMY    . CHOLECYSTECTOMY     Family History Family History  Problem Relation Age of Onset  . Breast cancer Daughter   . Heart disease Mother 45  . Heart disease Father 48    Social History Social History   Tobacco Use  . Smoking status: Never  . Smokeless tobacco: Never  Vaping Use  . Vaping Use: Never used  Substance Use Topics  . Alcohol use: No  . Drug use: No   Allergies Codeine and Morphine and related  Review of Systems Review of Systems  HENT:  Positive for facial swelling.   Respiratory:  Positive for shortness of breath.    Cardiovascular:  Positive for leg swelling.    Physical Exam Vital Signs  I have reviewed the triage vital signs BP (!) 152/92 (BP Location: Right Arm)   Pulse 85   Temp 98.1 F (36.7 C)   Resp 20   SpO2 98%   Physical Exam Vitals and nursing note reviewed.  Constitutional:      General: She is not in acute distress.    Appearance: She is well-developed.  HENT:     Head: Normocephalic and atraumatic.  Eyes:     Conjunctiva/sclera: Conjunctivae normal.  Neck:     Comments: Palpable nodules in the neck Cardiovascular:     Rate and Rhythm: Normal rate and regular rhythm.     Heart sounds: No murmur heard. Pulmonary:     Effort: Pulmonary effort is normal. No respiratory distress.     Breath sounds: Normal breath sounds.  Abdominal:     Palpations: Abdomen is soft.     Tenderness: There is no abdominal tenderness.  Musculoskeletal:        General: No swelling.     Cervical back: Neck supple.  Lymphadenopathy:     Cervical: Cervical adenopathy present.  Skin:    General: Skin is warm and dry.     Capillary Refill: Capillary refill takes less than 2 seconds.  Neurological:     Mental Status: She is alert.  Psychiatric:        Mood and Affect: Mood normal.     ED Results and Treatments Labs (all labs ordered are listed, but only abnormal results are displayed) Labs Reviewed  BASIC METABOLIC PANEL - Abnormal; Notable for the following components:      Result Value   Creatinine, Ser 1.54 (*)    GFR, Estimated 34 (*)    All other components within normal limits  CBC WITH DIFFERENTIAL/PLATELET  TSH  HEPATIC FUNCTION PANEL  BRAIN NATRIURETIC PEPTIDE  TROPONIN I (HIGH SENSITIVITY)                                                                                                                          Radiology No results found.  Pertinent labs & imaging results that were available  during my care of the patient were reviewed by me and considered in my medical  decision making (see MDM for details).  Medications Ordered in ED Medications  lactated ringers bolus 500 mL (has no administration in time range)                                                                                                                                     Procedures Procedures  (including critical care time)  Medical Decision Making / ED Course   This patient presents to the ED for concern of ***, this involves an extensive number of treatment options, and is a complaint that carries with it a high risk of complications and morbidity.  The differential diagnosis includes ***  MDM: ***   Additional history obtained: -Additional history obtained from *** -External records from outside source obtained and reviewed including: Chart review including previous notes, labs, imaging, consultation notes   Lab Tests: -I ordered, reviewed, and interpreted labs.   The pertinent results include:   Labs Reviewed  BASIC METABOLIC PANEL - Abnormal; Notable for the following components:      Result Value   Creatinine, Ser 1.54 (*)    GFR, Estimated 34 (*)    All other components within normal limits  CBC WITH DIFFERENTIAL/PLATELET  TSH  HEPATIC FUNCTION PANEL  BRAIN NATRIURETIC PEPTIDE  TROPONIN I (HIGH SENSITIVITY)      EKG ***  EKG Interpretation  Date/Time:    Ventricular Rate:    PR Interval:    QRS Duration:   QT Interval:    QTC Calculation:   R Axis:     Text Interpretation:           Imaging Studies ordered: I ordered imaging studies including *** I independently visualized and interpreted imaging. I agree with the radiologist interpretation   Medicines ordered and prescription drug management: Meds ordered this encounter  Medications  . DISCONTD: lactated ringers bolus 1,000 mL  . lactated ringers bolus 500 mL    -I have reviewed the patients home medicines and have made adjustments as needed  Critical  interventions ***  Consultations Obtained: I requested consultation with the ***,  and discussed lab and imaging findings as well as pertinent plan - they recommend: ***   Cardiac Monitoring: The patient was maintained on a cardiac monitor.  I personally viewed and interpreted the cardiac monitored which showed an underlying rhythm of: ***  Social Determinants of Health:  Factors impacting patients care include: ***   Reevaluation: After the interventions noted above, I reevaluated the patient and found that they have :{resolved/improved/worsened:23923::"improved"}  Co morbidities that complicate the patient evaluation . Past Medical History:  Diagnosis Date  . Renal stones   . Thyroid disease       Dispostion: I considered admission for this patient, ***     Final Clinical Impression(s) / ED Diagnoses Final diagnoses:  None     @  PCDICTATION@

## 2023-02-03 ENCOUNTER — Emergency Department (HOSPITAL_COMMUNITY): Payer: Medicare Other

## 2023-02-03 LAB — HEPATIC FUNCTION PANEL
ALT: 27 U/L (ref 0–44)
AST: 35 U/L (ref 15–41)
Albumin: 4.1 g/dL (ref 3.5–5.0)
Alkaline Phosphatase: 64 U/L (ref 38–126)
Bilirubin, Direct: 0.1 mg/dL (ref 0.0–0.2)
Indirect Bilirubin: 0.5 mg/dL (ref 0.3–0.9)
Total Bilirubin: 0.6 mg/dL (ref 0.3–1.2)
Total Protein: 7.8 g/dL (ref 6.5–8.1)

## 2023-02-03 LAB — T4, FREE: Free T4: <0.25 ng/dL — ABNORMAL LOW (ref 0.61–1.12)

## 2023-02-03 LAB — BRAIN NATRIURETIC PEPTIDE: B Natriuretic Peptide: 32.4 pg/mL (ref 0.0–100.0)

## 2023-02-03 LAB — TROPONIN I (HIGH SENSITIVITY): Troponin I (High Sensitivity): 7 ng/L (ref <18)

## 2023-02-03 MED ORDER — LEVOTHYROXINE SODIUM 150 MCG PO TABS: 150.0000 ug | ORAL_TABLET | Freq: Every day | ORAL | 0 refills | Status: DC

## 2023-02-03 MED ORDER — SODIUM CHLORIDE (PF) 0.9 % IJ SOLN
INTRAMUSCULAR | Status: AC
  Filled 2023-02-03: qty 50

## 2023-02-03 MED ORDER — IOHEXOL 300 MG/ML  SOLN
60.0000 mL | Freq: Once | INTRAMUSCULAR | Status: AC | PRN
  Administered 2023-02-03: 60 mL via INTRAVENOUS

## 2023-02-04 LAB — T3, FREE: T3, Free: 0.5 pg/mL — ABNORMAL LOW (ref 2.0–4.4)

## 2023-02-19 NOTE — Progress Notes (Signed)
Patient ID: Abigail Ryan, female   DOB: 1946-10-20, 77 y.o.   MRN: [id number]  HPI  LLULIANA Ryan is a 77 y.o.-year-old female, referred by her PCP, Dr. Lovett Sox., for management of hypothyroidism.  She was referred to endocrinology after presenting to the emergency room recently after being off her levothyroxine for 3 months.  She mentions that there was a disagreement with her PCP regarding the frequency of thyroid checks, so she did not have her levothyroxine refilled.  At the time of her ED visit, she had shortness of breath and neck swelling.  A TSH was found to be very elevated, at 58.  She was given a prescription for levothyroxine and referred to endocrinology.  Pt. describes that she was dx with hypothyroidism in ***.   Pt. is on levothyroxine 150 mcg daily, taken: - in am - fasting - at least 30 min from b'fast - no calcium - no iron - no multivitamins - no PPIs - not on Biotin  I reviewed pt's thyroid tests: Lab Results  Component Value Date   TSH 58.165 (H) 02/02/2023   TSH 0.540 01/25/2019   FREET4 <0.25 (L) 02/02/2023   T3FREE 0.5 (L) 02/02/2023   Antithyroid antibodies: No results found for: "THGAB" No components found for: "TPOAB"  Pt describes: - fatigue - weight gain - cold intolerance - constipation - dry skin - hair loss  Pt denies: - feeling nodules in neck - hoarseness - dysphagia - choking - SOB with lying down  She has + FH of thyroid disorders in: ***. No FH of thyroid cancer. No h/o radiation tx to head or neck. No recent use of iodine supplements.  No herbal supplements.  No recent steroid use.  Pt. also has a history of CHF, paroxysmal atrial fibrillation, A-fib with RVR, history of stroke, mixed hyperlipidemia, and colitis. She also has a history of diabetes but I do not have her most recent records: Lab Results  Component Value Date   HGBA1C 7.0 (H) 02/19/2019   ROS: + see HPI  PE: There were no vitals taken for this visit. Wt  Readings from Last 3 Encounters:  06/17/21 253 lb (114.8 kg)  09/29/20 253 lb (114.8 kg)  10/20/19 220 lb (99.8 kg)   Constitutional: overweight, in NAD Eyes:  EOMI, no exophthalmos ENT: no neck masses, no cervical lymphadenopathy Cardiovascular: RRR, No MRG Respiratory: CTA B Musculoskeletal: no deformities Skin:no rashes Neurological: no tremor with outstretched hands  ASSESSMENT: 1.  Uncontrolled hypothyroidism  PLAN:  1. Patient with long-standing hypothyroidism, on levothyroxine therapy.  She was off her levothyroxine for 3 months as she was not able to refill the prescription.  She started to develop shortness of breath and neck swelling.  She presented to the emergency room and a TSH was very high.  She was given a prescription for levothyroxine.  Her symptoms improved afterwards. - she appears euthyroid at today's visit - latest thyroid labs reviewed with pt. >> TSH was elevated: Lab Results  Component Value Date   TSH 58.165 (H) 02/02/2023  - she is now on LT4 150 mcg daily - pt feels good on this dose. - we discussed about taking the thyroid hormone every day, with water, >30 minutes before breakfast, separated by >4 hours from acid reflux medications, calcium, iron, multivitamins. Pt. is taking it correctly. - will check thyroid tests in 2 more weeks: TSH and fT4 - If labs are abnormal, she will need to return for repeat TFTs in 1.5  months - Otherwise, I will see her back in 4 months  Philemon Kingdom, MD PhD [facility] Endocrinology

## 2023-02-20 ENCOUNTER — Ambulatory Visit: Payer: Medicare Other | Admitting: Internal Medicine

## 2023-02-20 ENCOUNTER — Encounter: Payer: Self-pay | Admitting: Internal Medicine

## 2023-02-20 VITALS — BP 134/82 | HR 85 | Ht 65.0 in | Wt 251.6 lb

## 2023-02-20 DIAGNOSIS — E039 Hypothyroidism, unspecified: Secondary | ICD-10-CM | POA: Diagnosis not present

## 2023-02-20 MED ORDER — LEVOTHYROXINE SODIUM 150 MCG PO TABS: 150.0000 ug | ORAL_TABLET | Freq: Every day | ORAL | 5 refills | Status: DC

## 2023-02-20 NOTE — Patient Instructions (Addendum)
Please continue Levothyroxine 150 mcg daily.  Take the thyroid hormone every day, with water, at least 30 minutes before breakfast, separated by at least 4 hours from: - acid reflux medications - calcium - iron - multivitamins  Please come back for labs in ~3 weeks.  Please return in ~3-4 months.

## 2023-03-19 ENCOUNTER — Other Ambulatory Visit (INDEPENDENT_AMBULATORY_CARE_PROVIDER_SITE_OTHER): Payer: Medicare Other

## 2023-03-19 DIAGNOSIS — E039 Hypothyroidism, unspecified: Secondary | ICD-10-CM

## 2023-03-19 LAB — T4, FREE: Free T4: 1.08 ng/dL (ref 0.60–1.60)

## 2023-03-19 LAB — TSH: TSH: 2.19 u[IU]/mL (ref 0.35–5.50)

## 2023-06-30 ENCOUNTER — Ambulatory Visit: Payer: Medicare Other | Admitting: Internal Medicine

## 2023-06-30 ENCOUNTER — Encounter: Payer: Self-pay | Admitting: Internal Medicine

## 2023-06-30 VITALS — BP 138/80 | HR 58 | Ht 65.0 in | Wt 249.8 lb

## 2023-06-30 DIAGNOSIS — E039 Hypothyroidism, unspecified: Secondary | ICD-10-CM

## 2023-06-30 NOTE — Progress Notes (Addendum)
Patient ID: Abigail Ryan, female   DOB: February 25, 1946, 77 y.o.   MRN: [id number]  HPI  Abigail Ryan is a 77 y.o.-year-old female, initially referred by her PCP, Dr.  Jeannetta Nap returning for follow-up for uncontrolled hypothyroidism.  Last visit 4 months ago.  Interim history: Patient feels well at today's visit, without complaints other than mild shortness of breath, chronic.  Reviewed and addended history: Pt. was referred to endocrinology after presenting to the emergency room recently after being off her levothyroxine for 3 months.  She mentions that there was a disagreement with her PCP (Dr. Hal Hope) regarding the frequency of thyroid checks, so she did not have her levothyroxine refilled.  At the time of her ED visit, she had shortness of breath and neck swelling.  A TSH was found to be very elevated, at 58.  She was given a prescription for levothyroxine and referred to endocrinology.  She describes that she was dx'ed with hypothyroidism at 9-42 y/o by screening TSH. She was started LT4 >> increasing doses, but for the last few years, she was on the 150 mcg daily with good control.  At our first visit from 02/2023, we continued LT4 150 mcg daily.  She takes the levothyroxine: - in am - fasting - with water - at least 30-60 min from b'fast - + calcium at night - no iron - no multivitamins - no PPIs - not on Biotin; she has not been taking B complex in a long time.  I reviewed pt's thyroid tests: Lab Results  Component Value Date   TSH 2.19 03/19/2023   TSH 58.165 (H) 02/02/2023   TSH 0.540 01/25/2019   FREET4 1.08 03/19/2023   FREET4 <0.25 (L) 02/02/2023   T3FREE 0.5 (L) 02/02/2023   Antithyroid antibodies: No results found for: "THGAB" No components found for: "TPOAB"  CT neck (02/03/2023): Thyroid: No visible thyroid tissue   Pt previously described: - fatigue - weight gain But these resolved.  Pt denies: - dysphagia - choking - SOB with lying down  But has: -  hoarseness  She has no FH of thyroid disorders. No FH of thyroid cancer. No h/o radiation tx to head or neck. No recent use of iodine supplements. + herbal supplements (Cardio Vibe). No recent steroid use.  Pt. also has a history of CHF, paroxysmal atrial fibrillation, A-fib with RVR, history of stroke, mixed hyperlipidemia, and colitis. She also has a history of diabetes but I do not have her most recent records: Lab Results  Component Value Date   HGBA1C 7.0 (H) 02/19/2019   ROS: + See HPI  Past Medical History:  Diagnosis Date   Renal stones    Thyroid disease    Past Surgical History:  Procedure Laterality Date   ABDOMINAL HYSTERECTOMY     APPENDECTOMY     CHOLECYSTECTOMY     Social History   Socioeconomic History   Marital status: Married    Spouse name: Not on file   Number of children: Not on file   Years of education: Not on file   Highest education level: Not on file  Occupational History   Occupation: Retired  Tobacco Use   Smoking status: Never   Smokeless tobacco: Never  Vaping Use   Vaping status: Never Used  Substance and Sexual Activity   Alcohol use: No   Drug use: No   Sexual activity: Not on file  Other Topics Concern   Not on file  Social History Narrative   Lives  in [place] w/ husband, who has Alzheimer's   Social Determinants of Health   Financial Resource Strain: Not on file  Food Insecurity: Not on file  Transportation Needs: Not on file  Physical Activity: Not on file  Stress: Not on file  Social Connections: Not on file  Intimate Partner Violence: Not on file   Current Outpatient Medications on File Prior to Visit  Medication Sig Dispense Refill   blood glucose meter kit and supplies KIT Dispense based on patient and insurance preference. Use up to four times daily as directed. (FOR ICD-9 250.00, 250.01). 1 each 0   furosemide (LASIX) 20 MG tablet Take 1 tablet by mouth every other day.     ibuprofen (ADVIL) 200 MG tablet Take 200  mg by mouth every 6 (six) hours as needed for fever, headache or moderate pain.     levothyroxine (SYNTHROID) 150 MCG tablet Take 1 tablet (150 mcg total) by mouth daily before breakfast. 30 tablet 5   No current facility-administered medications on file prior to visit.   Allergies  Allergen Reactions   Codeine Nausea Only and Other (See Comments)    Extreme headaches also   Morphine And Codeine Other (See Comments)    Extreme headaches   Family History  Problem Relation Age of Onset   Breast cancer Daughter    Heart disease Mother 72   Heart disease Father 30   PE: BP 138/80   Pulse (!) 58   Ht 5\' 5"  (1.651 m)   Wt 249 lb 12.8 oz (113.3 kg)   SpO2 95%   BMI 41.57 kg/m  Wt Readings from Last 3 Encounters:  06/30/23 249 lb 12.8 oz (113.3 kg)  02/20/23 251 lb 9.6 oz (114.1 kg)  06/17/21 253 lb (114.8 kg)   Constitutional: overweight, in NAD Eyes:  EOMI, no exophthalmos ENT: no neck masses, no cervical lymphadenopathy Cardiovascular: RRR, No MRG Respiratory: CTA B Musculoskeletal: no deformities Skin:no rashes Neurological: no tremor with outstretched hands  ASSESSMENT: 1.  Uncontrolled hypothyroidism  PLAN:  1. Patient with longstanding hypothyroidism, on levothyroxine therapy.  Before our last visit, she was off her levothyroxine for 3 months as she was not able to refill the prescription.  She developed shortness of breath and neck swelling along with significant weakness and fatigue.  A TSH was very high as checked when she presented to the emergency room.  She was given a prescription for levothyroxine. When I first saw her, she was able to take the levothyroxine consistently and symptoms were improving day by day.  We discussed about consequences of uncontrolled hypothyroidism including immediate complications but also long-term: CHF, pericarditis, cognitive problems. - Fortunately, she was able to take levothyroxine consistently since last visit - latest thyroid labs  reviewed with pt. >> normal: Lab Results  Component Value Date   TSH 2.19 03/19/2023  - she continues on LT4 150 mcg daily - pt feels good on this dose, w/o complaints today other than mild shortness of breath, which is not new. - we discussed about taking the thyroid hormone every day, with water, >30 minutes before breakfast, separated by >4 hours from acid reflux medications, calcium, iron, multivitamins. Pt. is taking it correctly.  She was previously not taking it correctly, dosing it at night, around the time when she was taking calcium.  We discussed about moving levothyroxine in the morning and keeping calcium at night.  She is doing this now. - will check thyroid tests today: TSH and fT4 -  If labs are abnormal, she will need to return for repeat TFTs in 1.5 months - OTW, I will have her return in 1 year.  Needs refills x 1 year.  Component     Latest Ref Rng 07/02/2023  TSH     0.35 - 5.50 uIU/mL 3.07   T4,Free(Direct)     0.60 - 1.60 ng/dL 4.09   TFTs are normal.  Refill sent.  Carlus Pavlov, MD PhD [facility] Endocrinology

## 2023-06-30 NOTE — Patient Instructions (Addendum)
Please continue Levothyroxine 150 mcg daily.  Take the thyroid hormone every day, with water, at least 30 minutes before breakfast, separated by at least 4 hours from: - acid reflux medications - calcium - iron - multivitamins  Please stop at the lab.  Please return in 1 year.

## 2023-07-01 ENCOUNTER — Other Ambulatory Visit: Payer: Self-pay

## 2023-07-01 DIAGNOSIS — E039 Hypothyroidism, unspecified: Secondary | ICD-10-CM

## 2023-07-01 MED ORDER — LEVOTHYROXINE SODIUM 150 MCG PO TABS: 150.0000 ug | ORAL_TABLET | Freq: Every day | ORAL | 3 refills | Status: DC

## 2023-07-02 ENCOUNTER — Other Ambulatory Visit (INDEPENDENT_AMBULATORY_CARE_PROVIDER_SITE_OTHER): Payer: Medicare Other

## 2023-07-02 DIAGNOSIS — E039 Hypothyroidism, unspecified: Secondary | ICD-10-CM | POA: Diagnosis not present

## 2023-07-02 LAB — TSH: TSH: 3.07 u[IU]/mL (ref 0.35–5.50)

## 2023-07-02 LAB — T4, FREE: Free T4: 0.93 ng/dL (ref 0.60–1.60)

## 2023-07-26 ENCOUNTER — Encounter (HOSPITAL_COMMUNITY): Payer: Self-pay

## 2023-07-26 ENCOUNTER — Emergency Department (HOSPITAL_COMMUNITY): Payer: Medicare Other

## 2023-07-26 ENCOUNTER — Emergency Department (HOSPITAL_COMMUNITY)
Admission: EM | Admit: 2023-07-26 | Discharge: 2023-07-26 | Disposition: A | Discharge status: Home / Self Care | Payer: Medicare Other | Attending: Emergency Medicine | Admitting: Emergency Medicine

## 2023-07-26 DIAGNOSIS — R0789 Other chest pain: Secondary | ICD-10-CM | POA: Diagnosis not present

## 2023-07-26 DIAGNOSIS — I700 Atherosclerosis of aorta: Secondary | ICD-10-CM

## 2023-07-26 DIAGNOSIS — R1013 Epigastric pain: Secondary | ICD-10-CM | POA: Diagnosis not present

## 2023-07-26 DIAGNOSIS — R079 Chest pain, unspecified: Secondary | ICD-10-CM | POA: Insufficient documentation

## 2023-07-26 DIAGNOSIS — I2510 Atherosclerotic heart disease of native coronary artery without angina pectoris: Secondary | ICD-10-CM

## 2023-07-26 DIAGNOSIS — Z1152 Encounter for screening for COVID-19: Secondary | ICD-10-CM | POA: Insufficient documentation

## 2023-07-26 DIAGNOSIS — I5032 Chronic diastolic (congestive) heart failure: Secondary | ICD-10-CM | POA: Diagnosis not present

## 2023-07-26 DIAGNOSIS — E119 Type 2 diabetes mellitus without complications: Secondary | ICD-10-CM | POA: Diagnosis not present

## 2023-07-26 DIAGNOSIS — I480 Paroxysmal atrial fibrillation: Secondary | ICD-10-CM | POA: Diagnosis not present

## 2023-07-26 DIAGNOSIS — R590 Localized enlarged lymph nodes: Secondary | ICD-10-CM | POA: Diagnosis not present

## 2023-07-26 DIAGNOSIS — R0602 Shortness of breath: Secondary | ICD-10-CM | POA: Diagnosis not present

## 2023-07-26 DIAGNOSIS — R509 Fever, unspecified: Secondary | ICD-10-CM | POA: Insufficient documentation

## 2023-07-26 DIAGNOSIS — R072 Precordial pain: Secondary | ICD-10-CM

## 2023-07-26 LAB — RESP PANEL BY RT-PCR (RSV, FLU A&B, COVID)  RVPGX2
Influenza A by PCR: NEGATIVE
Influenza B by PCR: NEGATIVE
Resp Syncytial Virus by PCR: NEGATIVE
SARS Coronavirus 2 by RT PCR: NEGATIVE

## 2023-07-26 LAB — COMPREHENSIVE METABOLIC PANEL
ALT: 20 U/L (ref 0–44)
AST: 20 U/L (ref 15–41)
Albumin: 3.7 g/dL (ref 3.5–5.0)
Alkaline Phosphatase: 82 U/L (ref 38–126)
Anion gap: 12 (ref 5–15)
BUN: 15 mg/dL (ref 8–23)
CO2: 27 mmol/L (ref 22–32)
Calcium: 9.3 mg/dL (ref 8.9–10.3)
Chloride: 100 mmol/L (ref 98–111)
Creatinine, Ser: 1.2 mg/dL — ABNORMAL HIGH (ref 0.44–1.00)
GFR, Estimated: 46 mL/min — ABNORMAL LOW (ref >60)
Glucose, Bld: 131 mg/dL — ABNORMAL HIGH (ref 70–99)
Potassium: 3.7 mmol/L (ref 3.5–5.1)
Sodium: 139 mmol/L (ref 135–145)
Total Bilirubin: 0.5 mg/dL (ref 0.3–1.2)
Total Protein: 7.1 g/dL (ref 6.5–8.1)

## 2023-07-26 LAB — CBC
HCT: 43.1 % (ref 36.0–46.0)
Hemoglobin: 13.8 g/dL (ref 12.0–15.0)
MCH: 29.3 pg (ref 26.0–34.0)
MCHC: 32 g/dL (ref 30.0–36.0)
MCV: 91.5 fL (ref 80.0–100.0)
Platelets: 241 10*3/uL (ref 150–400)
RBC: 4.71 MIL/uL (ref 3.87–5.11)
RDW: 13.9 % (ref 11.5–15.5)
WBC: 10.5 10*3/uL (ref 4.0–10.5)
nRBC: 0 % (ref 0.0–0.2)

## 2023-07-26 LAB — TROPONIN I (HIGH SENSITIVITY)
Troponin I (High Sensitivity): 6 ng/L (ref <18)
Troponin I (High Sensitivity): 7 ng/L (ref <18)

## 2023-07-26 LAB — LIPASE, BLOOD: Lipase: 37 U/L (ref 11–51)

## 2023-07-26 LAB — D-DIMER, QUANTITATIVE: D-Dimer, Quant: <0.27 ug{FEU}/mL (ref 0.00–0.50)

## 2023-07-26 LAB — CBG MONITORING, ED: Glucose-Capillary: 136 mg/dL — ABNORMAL HIGH (ref 70–99)

## 2023-07-26 MED ORDER — PANTOPRAZOLE SODIUM 40 MG IV SOLR
40.0000 mg | Freq: Once | INTRAVENOUS | Status: AC
  Administered 2023-07-26: 40 mg via INTRAVENOUS
  Filled 2023-07-26: qty 10

## 2023-07-26 MED ORDER — METOPROLOL SUCCINATE ER 25 MG PO TB24: 25.0000 mg | ORAL_TABLET | Freq: Every day | ORAL | 0 refills | Status: DC

## 2023-07-26 MED ORDER — FENTANYL CITRATE PF 50 MCG/ML IJ SOSY
50.0000 ug | PREFILLED_SYRINGE | Freq: Once | INTRAMUSCULAR | Status: AC
  Administered 2023-07-26: 50 ug via INTRAVENOUS
  Filled 2023-07-26: qty 1

## 2023-07-26 MED ORDER — LIDOCAINE VISCOUS HCL 2 % MT SOLN
15.0000 mL | Freq: Once | OROMUCOSAL | Status: AC
  Administered 2023-07-26: 15 mL via ORAL
  Filled 2023-07-26: qty 15

## 2023-07-26 MED ORDER — IOHEXOL 350 MG/ML SOLN
75.0000 mL | Freq: Once | INTRAVENOUS | Status: AC | PRN
  Administered 2023-07-26: 75 mL via INTRAVENOUS

## 2023-07-26 MED ORDER — PANTOPRAZOLE SODIUM 20 MG PO TBEC: 20.0000 mg | DELAYED_RELEASE_TABLET | Freq: Every day | ORAL | 0 refills | Status: DC

## 2023-07-26 MED ORDER — ROSUVASTATIN CALCIUM 10 MG PO TABS: 10.0000 mg | ORAL_TABLET | Freq: Every day | ORAL | 0 refills | Status: DC

## 2023-07-26 MED ORDER — AMLODIPINE BESYLATE 5 MG PO TABS: 5.0000 mg | ORAL_TABLET | Freq: Every day | ORAL | 0 refills | Status: DC

## 2023-07-26 MED ORDER — SODIUM CHLORIDE 0.9 % IV SOLN: Freq: Once | INTRAVENOUS | Status: AC

## 2023-07-26 MED ORDER — ONDANSETRON HCL 4 MG/2ML IJ SOLN
4.0000 mg | Freq: Once | INTRAMUSCULAR | Status: AC
  Administered 2023-07-26: 4 mg via INTRAVENOUS
  Filled 2023-07-26: qty 2

## 2023-07-26 MED ORDER — ALUM & MAG HYDROXIDE-SIMETH 200-200-20 MG/5ML PO SUSP
30.0000 mL | Freq: Once | ORAL | Status: AC
  Administered 2023-07-26: 30 mL via ORAL
  Filled 2023-07-26: qty 30

## 2023-07-26 MED ORDER — FENTANYL CITRATE PF 50 MCG/ML IJ SOSY
12.5000 ug | PREFILLED_SYRINGE | Freq: Once | INTRAMUSCULAR | Status: AC
  Administered 2023-07-26: 12.5 ug via INTRAVENOUS
  Filled 2023-07-26: qty 1

## 2023-07-26 NOTE — ED Notes (Signed)
Pt was able to walk to the bathroom with no assistance from this NT

## 2023-07-26 NOTE — Discharge Instructions (Signed)
Take medications as prescribed per cardiology per myself.  Follow-up with your primary care doctor early next week for reevaluation.  Follow-up with cardiology and pulmonology.  As discussed you have a pulmonary mass that needs further evaluation.  Please return if symptoms worsen.

## 2023-07-26 NOTE — Progress Notes (Signed)
ON-CALL CARDIOLOGY 07/26/23  Patient's name: Abigail Ryan.   MRN: [id number].    DOB: Dec 08, 1946 Primary care provider: Kaleen Mask, MD. Primary cardiologist: Dr. Truett Mainland  Interaction regarding this patient's care today: Patient presents to the ED for evaluation of chest pain.  Her symptoms of precordial discomfort are not brought on by effort related activities and does not resolve with rest.  More noticeable after eating and feels like indigestion and also pleuritic in nature.  She has undergone EKGs which are nonischemic.  High sensitive troponins are negative x 2.  Nongated CT study notes aortic atherosclerosis and coronary artery calcification and patient is not on aspirin or statin therapy.  CT PE study is negative for pulmonary embolism but incidental finding 2 x 4.2 cm mass in the right infrahilar region likely adenopathy, although central lung mass is possible.  Please refer to the report for additional details.  Impression: Precordial pain Coronary artery calcification on nongated CT study. Aortic atherosclerosis.  Recommendations: Had a discussion with ED physician Dr. Lockie Mola regarding her presentation and plan of care.  Given her age, risk factors, and both aortic and coronary artery calcification ischemic workup is warranted.  However, symptoms, EKG, and troponins rule out acute coronary syndrome.  Either recommended hospital admission with stress test on Monday or outpatient evaluation.  Patient prefers to be evaluated outpatient.  For now recommend amlodipine 5 mg p.o. daily, Toprol-XL 25 mg p.o. daily, and Protonix.  Start aspirin and statin therapy  Will have the office reach out to her to schedule a follow-up appoint with Dr. Rosemary Holms.  Telephone encounter total time: 15 minutes  Delilah Shan [facility]  Pager:  [phone] Office: [phone]

## 2023-07-26 NOTE — ED Provider Notes (Signed)
[place] EMERGENCY DEPARTMENT AT [facility] Provider Note  CSN: [id number] Arrival date & time: 07/26/23 0122  Chief Complaint(s) Chest Pain  HPI Abigail Ryan is a 77 y.o. female    The history is provided by the patient.  Chest Pain Pain location:  Substernal area Pain quality: aching and pressure   Pain severity:  Moderate Onset quality:  Gradual Duration:  3 hours Timing:  Constant Progression:  Waxing and waning Chronicity:  New Context: at rest   Context comment:  2 hrs post prandial Relieved by:  Nitroglycerin Worsened by:  Exertion Associated symptoms: abdominal pain (epigastric), nausea and shortness of breath   Associated symptoms: no fever, no headache, no lower extremity edema and no vomiting    Given ASA by EMS.  Past Medical History Past Medical History:  Diagnosis Date   Renal stones    Thyroid disease    Patient Active Problem List   Diagnosis Date Noted   Bradycardia 09/29/2020   Mixed hyperlipidemia 09/29/2020   Chronic heart failure with preserved ejection fraction (HCC) 09/29/2020   Paroxysmal A-fib (HCC) 09/29/2020   Educated about COVID-19 virus infection 09/29/2020   Type 2 diabetes mellitus with other specified complication (HCC) 02/19/2019   Acute CVA (cerebrovascular accident) (HCC) 02/18/2019   H/O: GI bleed 02/18/2019   Atrial fibrillation with RVR (HCC) 01/27/2019   Liver abscess 01/27/2019   Hypothyroid 01/25/2019   Sepsis (HCC) 01/25/2019   Elevated troponin 01/25/2019   Home Medication(s) Prior to Admission medications   Medication Sig Start Date End Date Taking? Authorizing Provider  blood glucose meter kit and supplies KIT Dispense based on patient and insurance preference. Use up to four times daily as directed. (FOR ICD-9 250.00, 250.01). 02/19/19   Zigmund Daniel., MD  furosemide (LASIX) 20 MG tablet Take 1 tablet by mouth every other day. Patient not taking: Reported on 06/30/2023 09/20/20   [provider]  ibuprofen (ADVIL) 200 MG tablet Take 200 mg by mouth every 6 (six) hours as needed for fever, headache or moderate pain. Patient not taking: Reported on 06/30/2023    [provider]  levothyroxine (SYNTHROID) 150 MCG tablet Take 1 tablet (150 mcg total) by mouth daily before breakfast. 07/01/23 09/29/23  Carlus Pavlov, MD                                                                                                                                    Allergies Codeine and Morphine and codeine  Review of Systems Review of Systems  Constitutional:  Negative for fever.  Respiratory:  Positive for shortness of breath.   Cardiovascular:  Positive for chest pain.  Gastrointestinal:  Positive for abdominal pain (epigastric) and nausea. Negative for vomiting.  Neurological:  Negative for headaches.   As noted in HPI  Physical Exam Vital Signs  I have reviewed the triage vital signs BP (!) 154/67  Pulse 98   Temp 99.8 F (37.7 C) (Oral)   Resp (!) 26   SpO2 97%   Physical Exam Vitals reviewed.  Constitutional:      General: She is not in acute distress.    Appearance: She is well-developed. She is obese. She is not diaphoretic.  HENT:     Head: Normocephalic and atraumatic.     Nose: Nose normal.  Eyes:     General: No scleral icterus.       Right eye: No discharge.        Left eye: No discharge.     Conjunctiva/sclera: Conjunctivae normal.     Pupils: Pupils are equal, round, and reactive to light.  Cardiovascular:     Rate and Rhythm: Normal rate and regular rhythm.     Heart sounds: No murmur heard.    No friction rub. No gallop.  Pulmonary:     Effort: Pulmonary effort is normal. No respiratory distress.     Breath sounds: Normal breath sounds. No stridor. No rales.  Abdominal:     General: There is no distension.     Palpations: Abdomen is soft.     Tenderness: There is no abdominal tenderness.  Musculoskeletal:        General: No  tenderness.     Cervical back: Normal range of motion and neck supple.     Right lower leg: No edema.     Left lower leg: No edema.  Skin:    General: Skin is warm and dry.     Findings: No erythema or rash.  Neurological:     Mental Status: She is alert and oriented to person, place, and time.     ED Results and Treatments Labs (all labs ordered are listed, but only abnormal results are displayed) Labs Reviewed  COMPREHENSIVE METABOLIC PANEL - Abnormal; Notable for the following components:      Result Value   Glucose, Bld 131 (*)    Creatinine, Ser 1.20 (*)    GFR, Estimated 46 (*)    All other components within normal limits  CBG MONITORING, ED - Abnormal; Notable for the following components:   Glucose-Capillary 136 (*)    All other components within normal limits  RESP PANEL BY RT-PCR (RSV, FLU A&B, COVID)  [id number]  CBC  LIPASE, BLOOD  D-DIMER, QUANTITATIVE  TROPONIN I (HIGH SENSITIVITY)  TROPONIN I (HIGH SENSITIVITY)                                                                                                                         EKG  EKG Interpretation Date/Time:  Saturday July 26 2023 01:27:46 EDT Ventricular Rate:  77 PR Interval:  128 QRS Duration:  93 QT Interval:  373 QTC Calculation: 423 R Axis:   81  Text Interpretation: Sinus arrhythmia Borderline right axis deviation Baseline wander in lead(s) II No acute changes Confirmed by Drema Pry ([id number]) on 07/26/2023 2:27:16 AM  Radiology DG Chest Portable 1 View  Result Date: 07/26/2023 CLINICAL DATA:  Substernal chest pain. EXAM: PORTABLE CHEST 1 VIEW COMPARISON:  February 03, 2023 FINDINGS: The heart size and mediastinal contours are within normal limits. There is moderate severity calcification of the aortic arch. Low lung volumes are seen with mild, diffuse, chronic appearing increased interstitial lung markings. Mild atelectasis is seen within the bilateral lung bases. No pleural effusion  or pneumothorax is identified. Multilevel degenerative changes are seen throughout the thoracic spine. IMPRESSION: Low lung volumes with mild bibasilar atelectasis. Electronically Signed   By: Aram Candela M.D.   On: 07/26/2023 02:01    Medications Ordered in ED Medications  alum & mag hydroxide-simeth (MAALOX/MYLANTA) 200-200-20 MG/5ML suspension 30 mL (30 mLs Oral Given 07/26/23 0157)    And  lidocaine (XYLOCAINE) 2 % viscous mouth solution 15 mL (15 mLs Oral Given 07/26/23 0157)  0.9 %  sodium chloride infusion ( Intravenous New Bag/Given 07/26/23 0204)  fentaNYL (SUBLIMAZE) injection 50 mcg (50 mcg Intravenous Given 07/26/23 0419)  pantoprazole (PROTONIX) injection 40 mg (40 mg Intravenous Given 07/26/23 0417)  fentaNYL (SUBLIMAZE) injection 12.5 mcg (12.5 mcg Intravenous Given 07/26/23 0719)   Procedures Procedures  (including critical care time) Medical Decision Making / ED Course   Medical Decision Making Amount and/or Complexity of Data Reviewed Labs: ordered. Decision-making details documented in ED Course. Radiology: ordered and independent interpretation performed. Decision-making details documented in ED Course. ECG/medicine tests: ordered and independent interpretation performed. Decision-making details documented in ED Course.  Risk OTC drugs. Prescription drug management. Parenteral controlled substances. Decision regarding hospitalization.    Substernal pain. Postprandial Pleuritic.  DDX below:  Favoring GI etiology such as GERD, esophageal spasm.  GI cocktail and Protonix given  Atypical for ACS. EKG w/o acute ischemic changes, dysrhythmias or blocks.  Serial troponins negative x 2.  Given atypical nature, feel is sufficient to rule out ACS in his clinical picture.  Dimer obtained to assess for PE and negative.   Presentation not classic for aortic dissection or esophageal perforation.  Chest x-ray without evidence of pneumonia, pneumothorax, pulmonary  edema pleural effusion.  CBC without leukocytosis or anemia CMP without significant electrolyte derangements.  Mild renal insufficiency without AKI.  Mild hyperglycemia without DKA.  No biliary obstruction or pancreatitis.  GI cocktail provided some improvement but pain returned.  Patient given fentanyl which did provide significant relief.  After initial workup completed, patient's pain recurred and still pleuritic. Patient noted to be hot to touch with uptrending temperature now 99.8.  COVID ordered We will get CT PE study to assess for occult pneumonia versus PE.  If this is negative, likely esophageal spasms.  Patient care turned over to oncoming provider. Patient case and results discussed in detail; please see their note for further ED managment.       Final Clinical Impression(s) / ED Diagnoses Final diagnoses:  None    This chart was dictated using voice recognition software.  Despite best efforts to proofread,  errors can occur which can change the documentation meaning.    Nira Conn, MD 07/26/23 [phone]

## 2023-07-26 NOTE — ED Provider Notes (Signed)
Patient signed out to me awaiting COVID and flu testing and CT scan of the chest to rule out PE.  Chest pain overnight sounds more like acid reflux/indigestion.  Troponins are normal x 2.  Sort of pleuritic.  D-dimer normal but will pursue CT scan to further evaluate.  Maybe low-grade fever.  Overall COVID and flu test are negative.  CT scan of the chest does not show evidence of pulmonary embolism.  There is a pulmonary mass with some adenopathy.  Overall I do not think she has pneumonia.  She does not have cough or sputum production.  No smoking history.  I we will have her follow-up with pulmonology to further discuss bronchoscopy/tissue sampling.  She is made aware of this.  Otherwise she has some mild atherosclerotic disease of the left anterior descending coronary artery.  Troponins are normal.  Atypical story for ACS.  I talked with Dr. Odis Hollingshead with cardiology and overall agrees that she can follow-up closely outpatient to finish an ischemic workup.  Meantime we can start her on Toprol, rosuvastatin, amlodipine.  Seems like in the past they wanted to start some of these medications but she was reluctant but overall she is okay with that now.  Will also put her on some Protonix as I suspect this could be reflux causing her issues.  Incidental pulmonary mass we will have her follow-up with pulmonology.  Patient discharged in good condition.  This chart was dictated using voice recognition software.  Despite best efforts to proofread,  errors can occur which can change the documentation meaning.    Virgina Norfolk, DO 07/26/23 1021

## 2023-07-26 NOTE — ED Triage Notes (Signed)
Pt BIB [facility] from home with c/o substernal chest pain that radiates to back started about 2330. Denies any other complaints. Pt took one nitrotab at home prior to calling EMS with some improvement. EMS gave pt 0.4 mg nitroglycerin and 324 mg ASA en route. GCS 15.  20 ga L AC  EMS Vitals: BP 126/68 HR 78 RR 16 O2 97% CBG not done

## 2023-07-26 NOTE — ED Notes (Signed)
Pt ambulated to bathroom 

## 2023-07-27 DIAGNOSIS — I700 Atherosclerosis of aorta: Secondary | ICD-10-CM

## 2023-07-27 DIAGNOSIS — R072 Precordial pain: Secondary | ICD-10-CM

## 2023-07-27 DIAGNOSIS — I2510 Atherosclerotic heart disease of native coronary artery without angina pectoris: Secondary | ICD-10-CM

## 2023-07-28 NOTE — Progress Notes (Deleted)
Patient referred by Kaleen Mask, * for arrhtymia  Subjective:   Abigail Ryan, female    DOB: 1946/07/11, 77 y.o.   MRN: [id number]   No chief complaint on file.    HPI  77 y.o. Caucasian female with hypertension, type 2 diabetes mellitus, obesity, paroxysmal atrial fibrillation, chest pain  ***  Initial consultation visit 2021: Patient lives in [place], [place] with her husband with severe dementia for which he is the primary caregiver.  She has significant caregiver burden related to lung taking care of her husband.  She finds sulisobenzone activity such as gardening.  Patient diagnosed with paroxysmal atrial fibrillation during her hospitalization with subcentimeter abscess and 01/2019.  She was recommended anticoagulation.  However, she has not been on anticoagulation ever since she was discharged from the hospital again.  She denies any palpitation symptoms.  She does have dyspnea with usual activity, with orthopnea.  She denies any chest pain, leg edema.  She is currently taking Lasix every other day without much improvement in her shortness of breath symptoms.  Recent labs reviewed with the patient, details below. Reviewed and independently interpreted prior cardiac testing.    Current Outpatient Medications:    amLODipine (NORVASC) 5 MG tablet, Take 1 tablet (5 mg total) by mouth daily., Disp: 30 tablet, Rfl: 0   blood glucose meter kit and supplies KIT, Dispense based on patient and insurance preference. Use up to four times daily as directed. (FOR ICD-9 250.00, 250.01)., Disp: 1 each, Rfl: 0   furosemide (LASIX) 20 MG tablet, Take 1 tablet by mouth every other day. (Patient not taking: Reported on 06/30/2023), Disp: , Rfl:    ibuprofen (ADVIL) 200 MG tablet, Take 200 mg by mouth every 6 (six) hours as needed for fever, headache or moderate pain. (Patient not taking: Reported on 06/30/2023), Disp: , Rfl:    levothyroxine (SYNTHROID) 150 MCG tablet, Take 1 tablet (150 mcg  total) by mouth daily before breakfast., Disp: 90 tablet, Rfl: 3   metoprolol succinate (TOPROL-XL) 25 MG 24 hr tablet, Take 1 tablet (25 mg total) by mouth daily., Disp: 30 tablet, Rfl: 0   pantoprazole (PROTONIX) 20 MG tablet, Take 1 tablet (20 mg total) by mouth daily for 14 days., Disp: 14 tablet, Rfl: 0   rosuvastatin (CRESTOR) 10 MG tablet, Take 1 tablet (10 mg total) by mouth daily., Disp: 30 tablet, Rfl: 0  Cardiovascular and other pertinent studies:  EKG 09/29/2020: Sinus rhythm 79 bpm Low voltage in precordial leads Otherwise normal EKG    Recent labs: 07/26/2023: Glucose 131, BUN/Cr 15/1.20. EGFR 46. Na/K 139/3.7. Rest of the CMP normal H/H 13/43. MCV 9. Platelets 1241 ***HbA1C ***% Chol ***, TG ***, HDL ***, LDL *** TSH 3.0 normal  09/23/2020: Glucose 114, BUN/Cr 15/1.0. EGFR 55. Na/K 139/4.8. Rest of the CMP normal H/H 13.3/42.0. MCV 92.5. Platelets 205 HbA1C 6.9 % Chol 264, TG 76, HDL 54, LDL 195 TSH 2.03 normal  02/2020: Chol 242, TG 83, HDL 60, LDL 165    Review of Systems  Cardiovascular:  Positive for dyspnea on exertion. Negative for chest pain, leg swelling, palpitations and syncope.         There were no vitals filed for this visit.    There is no height or weight on file to calculate BMI. There were no vitals filed for this visit.    Objective:   Physical Exam Vitals and nursing note reviewed.  Constitutional:      General: She  is not in acute distress. Neck:     Vascular: No JVD.  Cardiovascular:     Rate and Rhythm: Normal rate and regular rhythm. Occasional Extrasystoles are present.    Pulses: Normal pulses.     Heart sounds: Normal heart sounds. No murmur heard. Pulmonary:     Effort: Pulmonary effort is normal.     Breath sounds: Normal breath sounds. No wheezing or rales.          Assessment & Recommendations:   77 y.o. Caucasian female with hypertension, type 2 diabetes mellitus, obesity, paroxysmal atrial  fibrillation.  Paroxysmal A. Fib: Diagnosed during echo admission for sepsis in February 2020.  She has not had any subsequent EKGs.  She has not been on anticoagulation.  She states that she feels poorly when her heart rate was in the 60s. Recommend echocardiogram due to being on the monitor. CHA2DS2VASc score 4, annual stroke risk 4%. Given that she has not been on anticoagulation all this well, she would like to continue holding off for now.  She understands related stroke risks.  HFpEF: NYHA class II-III.  Currently on Lasix every other day.  Added spironolactone 25 mg daily.  Repeat BMP in 1 week.  Counseled regarding salt restriction diet.  Hyperlipidemia: LDL 195.  She is reluctant to start statin.  After much discussion, she is agreed to start rosuvastatin 10 mg daily.  I have discussed with patient regarding the safety during COVID Pandemic and steps and precautions to be taken including social distancing, frequent hand wash and use of detergent soap, gels with the patient. I asked the patient to avoid touching mouth, nose, eyes, ears with her hands. I encouraged heart healthy diet, regular exercise- as long as social distancing can be maintained.  Patient remains reluctant to take COVID vaccine.     Thank you for referring the patient to [place]. Please feel free to contact with any questions.   Elder Negus, MD Pager: [phone] Office: [phone]

## 2023-07-29 ENCOUNTER — Encounter: Payer: Medicare Other | Admitting: Cardiology

## 2023-07-29 DIAGNOSIS — R072 Precordial pain: Secondary | ICD-10-CM

## 2023-07-31 ENCOUNTER — Encounter: Payer: Self-pay | Admitting: Cardiology

## 2023-07-31 ENCOUNTER — Ambulatory Visit: Payer: Medicare Other | Admitting: Cardiology

## 2023-07-31 VITALS — BP 153/76 | HR 76 | Resp 16 | Ht 65.0 in | Wt 245.0 lb

## 2023-07-31 DIAGNOSIS — R072 Precordial pain: Secondary | ICD-10-CM

## 2023-07-31 NOTE — Progress Notes (Signed)
Patient referred by Kaleen Mask, * for arrhtymia  Subjective:   Abigail Ryan, female    DOB: 1946-02-23, 77 y.o.   MRN: [id number]   No chief complaint on file.    HPI  77 y.o. Caucasian female with hypertension, type 2 diabetes mellitus, obesity, remote h/o paroxysmal atrial fibrillation (2020 during sepsis) with no recurrence, now referred for chest pain  Patient was recently seen in [facility], ER for chest pain.  She reports that she had had eaten fried chicken, and had laid down.  Chest pain started epigastric, radiated to her retrosternal area, and improved only after gagging and coughing out a piece of fried chicken.  She has not had any chest pain since then.  She has had exertional dyspnea for a while.  Workup in ER, also showed questionable lung mass on CT chest.  She has an upcoming appointment with pulmonology for the same.  On a separate note, she recently underwent labs checked with her PCP, results not available to me.  Initial consultation visit 2021: Patient lives in [place], [place] with her husband with severe dementia for which he is the primary caregiver.  She has significant caregiver burden related to lung taking care of her husband.  She finds sulisobenzone activity such as gardening.  Patient diagnosed with paroxysmal atrial fibrillation during her hospitalization with subcentimeter abscess and 01/2019.  She was recommended anticoagulation.  However, she has not been on anticoagulation ever since she was discharged from the hospital again.  She denies any palpitation symptoms.  She does have dyspnea with usual activity, with orthopnea.  She denies any chest pain, leg edema.  She is currently taking Lasix every other day without much improvement in her shortness of breath symptoms.  Recent labs reviewed with the patient, details below. Reviewed and independently interpreted prior cardiac testing.    Current Outpatient Medications:    amLODipine (NORVASC) 5  MG tablet, Take 1 tablet (5 mg total) by mouth daily., Disp: 30 tablet, Rfl: 0   blood glucose meter kit and supplies KIT, Dispense based on patient and insurance preference. Use up to four times daily as directed. (FOR ICD-9 250.00, 250.01)., Disp: 1 each, Rfl: 0   furosemide (LASIX) 20 MG tablet, Take 1 tablet by mouth daily as needed., Disp: , Rfl:    ibuprofen (ADVIL) 200 MG tablet, Take 200 mg by mouth every 6 (six) hours as needed for fever, headache or moderate pain., Disp: , Rfl:    levothyroxine (SYNTHROID) 150 MCG tablet, Take 1 tablet (150 mcg total) by mouth daily before breakfast., Disp: 90 tablet, Rfl: 3   metoprolol succinate (TOPROL-XL) 25 MG 24 hr tablet, Take 1 tablet (25 mg total) by mouth daily., Disp: 30 tablet, Rfl: 0   pantoprazole (PROTONIX) 20 MG tablet, Take 1 tablet (20 mg total) by mouth daily for 14 days., Disp: 14 tablet, Rfl: 0   rosuvastatin (CRESTOR) 10 MG tablet, Take 1 tablet (10 mg total) by mouth daily., Disp: 30 tablet, Rfl: 0  Cardiovascular and other pertinent studies:  EKG 07/31/2023: Sinus rhythm 71 bpm with rate variation Low voltage in precordial leads Nonspecific T-abnormality  CTA chest 07/26/2023: 1. No evidence of pulmonary embolism. 2. Mild posterior heterogeneous airspace opacification over the lung bases likely atelectasis, although infection is possible. 3. 2 x 4.2 cm mass in the right infrahilar region likely adenopathy, although central lung mass is possible. Additional mediastinal adenopathy as described. Recommend correlation with patient's history and consider pulmonary  consultation for possible PET-CT or bronchoscopy/tissue sampling. 4. Mild cardiomegaly. Atherosclerotic vascular disease including the left anterior descending coronary artery. 5. Aortic atherosclerosis.   Aortic Atherosclerosis (ICD10-I70.0).  Echocardiogram 01/26/2019: Normal LV EF. No significant wall motion abnormalities noted, though  technical limitations  reduce sensitivity for mild abnormalities. No  significant valve disease.   FINDINGS   Left Ventricle: The left ventricle has normal systolic function, with an  ejection fraction of 55-60%. The cavity size was normal. Mild asymmetric  septal hypertrophy. Left ventricular diastolic parameters were normal No  evidence of left ventricular  regional wall motion abnormalities..  Right Ventricle: The right ventricle has normal systolic function. The  cavity was mildly enlarged. There is no increase in right ventricular wall  thickness. Right ventricular systolic pressure could not be assessed.    Recent labs: 07/26/2023: Glucose 131, BUN/Cr 15/1.2.K 3.7 Hb 13.7 TSH 3.0 normal  07/26/2023: Glucose 131, BUN/Cr 15/1.20. EGFR 46. Na/K 139/3.7. Rest of the CMP normal H/H 13/43. MCV 9. Platelets 1241 TSH 3.0 normal  09/23/2020: Glucose 114, BUN/Cr 15/1.0. EGFR 55. Na/K 139/4.8. Rest of the CMP normal H/H 13.3/42.0. MCV 92.5. Platelets 205 HbA1C 6.9 % Chol 264, TG 76, HDL 54, LDL 195 TSH 2.03 normal  02/2020: Chol 242, TG 83, HDL 60, LDL 165    Review of Systems  Cardiovascular:  Positive for chest pain and dyspnea on exertion. Negative for leg swelling, palpitations and syncope.         Vitals:   07/31/23 1042  BP: (!) 153/76  Pulse: 76  Resp: 16  SpO2: 98%      Body mass index is 40.77 kg/m. Filed Weights   07/31/23 1042  Weight: 245 lb (111.1 kg)      Objective:   Physical Exam Vitals and nursing note reviewed.  Constitutional:      General: She is not in acute distress. Neck:     Vascular: No JVD.  Cardiovascular:     Rate and Rhythm: Normal rate and regular rhythm. Occasional Extrasystoles are present.    Pulses: Normal pulses.     Heart sounds: Normal heart sounds. No murmur heard. Pulmonary:     Effort: Pulmonary effort is normal.     Breath sounds: Normal breath sounds. No wheezing or rales.          Assessment & Recommendations:    77 y.o.  Caucasian female with hypertension, type 2 diabetes mellitus, obesity, remote h/o paroxysmal atrial fibrillation (2020 during sepsis) with no recurrence, now referred for chest pain  Chest pain: By history, it is quite possible that her chest pain episode was related to GERD with eating fried chicken.  She has not had any exertional chest pain.  ACS was ruled out in the ER.  She has had exertional dyspnea.  However, she is rather reluctant to pursue any further cardiac workup at this time, pending further pulmonary workup for questionable lung mass on CT chest.  I will see her back in 3 months to follow-up.  If symptoms have not improved, and pulmonary workup is unyielding, will consider repeat echocardiogram as well as exercise nuclear stress test.  Paroxysmal A. Fib: Diagnosed during echo admission for sepsis in February 2020.  She has not had any subsequent EKGs.  She has not been on anticoagulation.  She states that she feels poorly when her heart rate was in the 60s. Recommend echocardiogram due to being on the monitor. CHA2DS2VASc score 4, annual stroke risk 4%. Given that she has  not been on anticoagulation all this well, she would like to continue holding off for now.  She understands related stroke risks.  Mixed hyperlipidemia: LDL previously in 190s.  Will obtain recent lab work from PCP.  Continue Crestor 10 mg daily for now, but will likely need higher dose.   F/u in 3 months    Elder Negus, MD Pager: [phone] Office: [phone]

## 2023-08-06 ENCOUNTER — Ambulatory Visit: Payer: Medicare Other | Admitting: Pulmonary Disease

## 2023-08-06 ENCOUNTER — Encounter: Payer: Self-pay | Admitting: Pulmonary Disease

## 2023-08-06 VITALS — BP 130/80 | HR 84 | Ht 65.0 in | Wt 243.4 lb

## 2023-08-06 DIAGNOSIS — R599 Enlarged lymph nodes, unspecified: Secondary | ICD-10-CM

## 2023-08-06 DIAGNOSIS — R918 Other nonspecific abnormal finding of lung field: Secondary | ICD-10-CM | POA: Diagnosis not present

## 2023-08-06 NOTE — Progress Notes (Signed)
Synopsis: Referred in August 2024 for pulmonary nodule by Kaleen Mask, *  Subjective:   PATIENT ID: Abigail Ryan GENDER: female DOB: Jun 17, 1946, MRN: [id number]  Chief Complaint  Patient presents with   Consult    Consult/Hosp f/up on lung nodule.    This is a 77 year old female, past medical history of renal stones thyroid disease, negative workup for chest pain she felt like she had a piece of chicken stuck when she was seen in the emergency room.  She had a CT scan of the chest that showed a 4 cm right hilar mass with adenopathy.  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    Past Medical History:  Diagnosis Date   Renal stones    Thyroid disease      Family History  Problem Relation Age of Onset   Breast cancer Daughter    Heart disease Mother 23   Heart disease Father 76     Past Surgical History:  Procedure Laterality Date   ABDOMINAL HYSTERECTOMY     APPENDECTOMY     CHOLECYSTECTOMY      Social History   Socioeconomic History   Marital status: Married    Spouse name: Not on file   Number of children: Not on file   Years of education: Not on file   Highest education level: Not on file  Occupational History   Occupation: Retired  Tobacco Use   Smoking status: Never   Smokeless tobacco: Never  Vaping Use   Vaping status: Never Used  Substance and Sexual Activity   Alcohol use: No   Drug use: No   Sexual activity: Not on file  Other Topics Concern   Not on file  Social History Narrative   Lives in [place] w/ husband, who has Alzheimer's   Social Determinants of Health   Financial Resource Strain: Not on file  Food Insecurity: Not on file  Transportation Needs: Not on file  Physical Activity: Not on file  Stress: Not on file  Social Connections: Not on file  Intimate Partner Violence: Not on file     Allergies  Allergen Reactions    Codeine Nausea Only and Other (See Comments)    Extreme headaches also   Morphine And Codeine Other (See Comments)    Extreme headaches     Outpatient Medications Prior to Visit  Medication Sig Dispense Refill   amLODipine (NORVASC) 5 MG tablet Take 1 tablet (5 mg total) by mouth daily. 30 tablet 0   blood glucose meter kit and supplies KIT Dispense based on patient and insurance preference. Use up to four times daily as directed. (FOR ICD-9 250.00, 250.01). 1 each 0   furosemide (LASIX) 20 MG tablet Take 1 tablet by mouth daily as needed.     ibuprofen (ADVIL) 200 MG tablet Take 200 mg by mouth every 6 (six) hours as needed for fever, headache or moderate pain.     levothyroxine (SYNTHROID) 150 MCG tablet Take 1 tablet (150 mcg total) by mouth daily before breakfast. 90 tablet 3   metoprolol succinate (TOPROL-XL) 25 MG 24 hr tablet Take 1 tablet (25 mg total) by mouth daily. 30 tablet 0   pantoprazole (PROTONIX) 20 MG tablet Take 1 tablet (20 mg total) by mouth daily for 14 days. 14 tablet 0   rosuvastatin (CRESTOR) 10 MG tablet Take 1 tablet (10 mg total) by mouth daily. 30  tablet 0   No facility-administered medications prior to visit.    Review of Systems  Constitutional:  Negative for chills, fever, malaise/fatigue and weight loss.  HENT:  Negative for hearing loss, sore throat and tinnitus.   Eyes:  Negative for blurred vision and double vision.  Respiratory:  Negative for cough, hemoptysis, sputum production, shortness of breath, wheezing and stridor.   Cardiovascular:  Negative for chest pain, palpitations, orthopnea, leg swelling and PND.  Gastrointestinal:  Negative for abdominal pain, constipation, diarrhea, heartburn, nausea and vomiting.  Genitourinary:  Negative for dysuria, hematuria and urgency.  Musculoskeletal:  Negative for joint pain and myalgias.  Skin:  Negative for itching and rash.  Neurological:  Negative for dizziness, tingling, weakness and headaches.   Endo/Heme/Allergies:  Negative for environmental allergies. Does not bruise/bleed easily.  Psychiatric/Behavioral:  Negative for depression. The patient is not nervous/anxious and does not have insomnia.   All other systems reviewed and are negative.    Objective:  Physical Exam Vitals reviewed.  Constitutional:      General: She is not in acute distress.    Appearance: She is well-developed.  HENT:     Head: Normocephalic and atraumatic.  Eyes:     General: No scleral icterus.    Conjunctiva/sclera: Conjunctivae normal.     Pupils: Pupils are equal, round, and reactive to light.  Neck:     Vascular: No JVD.     Trachea: No tracheal deviation.  Cardiovascular:     Rate and Rhythm: Normal rate and regular rhythm.     Heart sounds: Normal heart sounds. No murmur heard. Pulmonary:     Effort: Pulmonary effort is normal. No tachypnea, accessory muscle usage or respiratory distress.     Breath sounds: No stridor. No wheezing, rhonchi or rales.  Abdominal:     General: There is no distension.     Palpations: Abdomen is soft.     Tenderness: There is no abdominal tenderness.  Musculoskeletal:        General: No tenderness.     Cervical back: Neck supple.  Lymphadenopathy:     Cervical: No cervical adenopathy.  Skin:    General: Skin is warm and dry.     Capillary Refill: Capillary refill takes less than 2 seconds.     Findings: No rash.  Neurological:     Mental Status: She is alert and oriented to person, place, and time.  Psychiatric:        Behavior: Behavior normal.      Vitals:   08/06/23 1436  BP: 130/80  Pulse: 84  SpO2: 95%  Weight: 243 lb 6.4 oz (110.4 kg)  Height: 5\' 5"  (1.651 m)   95% on RA BMI Readings from Last 3 Encounters:  08/06/23 40.50 kg/m  07/31/23 40.77 kg/m  06/30/23 41.57 kg/m   Wt Readings from Last 3 Encounters:  08/06/23 243 lb 6.4 oz (110.4 kg)  07/31/23 245 lb (111.1 kg)  06/30/23 249 lb 12.8 oz (113.3 kg)     CBC     Component Value Date/Time   WBC 10.5 07/26/2023 0202   RBC 4.71 07/26/2023 0202   HGB 13.8 07/26/2023 0202   HCT 43.1 07/26/2023 0202   PLT 241 07/26/2023 0202   MCV 91.5 07/26/2023 0202   MCH 29.3 07/26/2023 0202   MCHC 32.0 07/26/2023 0202   RDW 13.9 07/26/2023 0202   LYMPHSABS 2.0 02/02/2023 2238   MONOABS 0.5 02/02/2023 2238   EOSABS 0.2 02/02/2023 2238   BASOSABS  0.0 02/02/2023 2238     Chest Imaging:  07/26/2023 CTA chest: Patient with a 2 x 4.2 cm right infrahilar mass, adenopathy within the chest concerning for malignancy. The patient's images have been independently reviewed by me.    Pulmonary Functions Testing Results:     No data to display          FeNO:   Pathology:   Echocardiogram:   Heart Catheterization:     Assessment & Plan:     ICD-10-CM   1. Adenopathy  R59.9 NM PET Image Initial (PI) Skull Base To Thigh (F-18 FDG)    2. Lung mass  R91.8       Discussion:  This is a 77 year old female, past medical history of skin cancer, no other malignancies before, found incidentally to have a right hilar mass with adenopathy concerning for potential malignancy within the chest.  Plan: Today in the office we talked about risk-benefit alternatives of proceeding with bronchoscopy versus nuclear medicine pet imaging. I reviewed the images and it appears as if the lesion is rather unusual to represent a primary lung cancer. I would like better imaging with nuclear medicine PET for before we consider best location to consider to biopsy. PET scan ASAP. Follow-up with me in clinic after PET complete.   Current Outpatient Medications:    amLODipine (NORVASC) 5 MG tablet, Take 1 tablet (5 mg total) by mouth daily., Disp: 30 tablet, Rfl: 0   blood glucose meter kit and supplies KIT, Dispense based on patient and insurance preference. Use up to four times daily as directed. (FOR ICD-9 250.00, 250.01)., Disp: 1 each, Rfl: 0   furosemide (LASIX) 20 MG  tablet, Take 1 tablet by mouth daily as needed., Disp: , Rfl:    ibuprofen (ADVIL) 200 MG tablet, Take 200 mg by mouth every 6 (six) hours as needed for fever, headache or moderate pain., Disp: , Rfl:    levothyroxine (SYNTHROID) 150 MCG tablet, Take 1 tablet (150 mcg total) by mouth daily before breakfast., Disp: 90 tablet, Rfl: 3   metoprolol succinate (TOPROL-XL) 25 MG 24 hr tablet, Take 1 tablet (25 mg total) by mouth daily., Disp: 30 tablet, Rfl: 0   pantoprazole (PROTONIX) 20 MG tablet, Take 1 tablet (20 mg total) by mouth daily for 14 days., Disp: 14 tablet, Rfl: 0   rosuvastatin (CRESTOR) 10 MG tablet, Take 1 tablet (10 mg total) by mouth daily., Disp: 30 tablet, Rfl: 0   Josephine Igo, DO [facility] 08/06/2023 3:10 PM

## 2023-08-06 NOTE — Patient Instructions (Signed)
Thank you for visiting Dr. Tonia Brooms at [facility]. Today we recommend the following:  Orders Placed This Encounter  Procedures   NM PET Image Initial (PI) Skull Base To Thigh (F-18 FDG)   Return in about 4 weeks (around 09/03/2023) for with APP or Dr. Tonia Brooms, after PET/CT Chest.    Please do your part to reduce the spread of COVID-19.

## 2023-08-14 NOTE — Progress Notes (Signed)
This encounter was created in error - please disregard.

## 2023-08-21 ENCOUNTER — Encounter (HOSPITAL_COMMUNITY): Payer: Medicare Other

## 2023-08-22 ENCOUNTER — Encounter (HOSPITAL_COMMUNITY)
Admission: RE | Admit: 2023-08-22 | Discharge: 2023-08-22 | Disposition: A | Discharge status: Home / Self Care | Payer: Medicare Other | Source: Ambulatory Visit | Attending: Pulmonary Disease | Admitting: Pulmonary Disease

## 2023-08-22 DIAGNOSIS — R599 Enlarged lymph nodes, unspecified: Secondary | ICD-10-CM | POA: Diagnosis present

## 2023-08-22 DIAGNOSIS — K5730 Diverticulosis of large intestine without perforation or abscess without bleeding: Secondary | ICD-10-CM | POA: Insufficient documentation

## 2023-08-22 DIAGNOSIS — I700 Atherosclerosis of aorta: Secondary | ICD-10-CM | POA: Insufficient documentation

## 2023-08-22 LAB — GLUCOSE, CAPILLARY: Glucose-Capillary: 107 mg/dL — ABNORMAL HIGH (ref 70–99)

## 2023-08-22 MED ORDER — FLUDEOXYGLUCOSE F - 18 (FDG) INJECTION
11.9100 | Freq: Once | INTRAVENOUS | Status: AC
  Administered 2023-08-22: 11.91 via INTRAVENOUS

## 2023-09-05 ENCOUNTER — Telehealth: Payer: Self-pay | Admitting: Pulmonary Disease

## 2023-09-05 NOTE — Telephone Encounter (Signed)
Received call report on PET from Ray with [place] [facility].   Katie, please advise. Thanks Dr. Tonia Brooms is unavailable.

## 2023-09-05 NOTE — Telephone Encounter (Signed)
Lm x1 for patient.  

## 2023-09-05 NOTE — Telephone Encounter (Signed)
She's actually had some improvement in the concerning areas in her chest. They have decreased in size. Next steps can be discussed at her follow up on 10/1.  She had findings of acute diverticulitis. If she has any symptoms of abdominal pain, fevers, chills, nausea/vomiting, bowel changes, needs to go the ED. Otherwise, she should call her PCP to discuss and decide treatment plan.

## 2023-09-05 NOTE — Telephone Encounter (Signed)
Tiffany calling with call report. For PET scan. Tiffany phone number is [phone].

## 2023-09-08 NOTE — Telephone Encounter (Signed)
Pt returning missed call from [facility]

## 2023-09-08 NOTE — Telephone Encounter (Signed)
Patient is aware of results and voiced her understanding.  Nothing further needed.   

## 2023-09-09 ENCOUNTER — Ambulatory Visit: Payer: Medicare Other | Admitting: Adult Health

## 2023-09-09 ENCOUNTER — Encounter: Payer: Self-pay | Admitting: Adult Health

## 2023-09-09 VITALS — BP 122/80 | HR 83 | Temp 97.6°F | Ht 65.0 in | Wt 248.8 lb

## 2023-09-09 DIAGNOSIS — K5790 Diverticulosis of intestine, part unspecified, without perforation or abscess without bleeding: Secondary | ICD-10-CM

## 2023-09-09 DIAGNOSIS — R918 Other nonspecific abnormal finding of lung field: Secondary | ICD-10-CM | POA: Diagnosis not present

## 2023-09-09 DIAGNOSIS — R911 Solitary pulmonary nodule: Secondary | ICD-10-CM

## 2023-09-09 DIAGNOSIS — R590 Localized enlarged lymph nodes: Secondary | ICD-10-CM | POA: Insufficient documentation

## 2023-09-09 NOTE — Progress Notes (Signed)
@Patient  ID: Abigail Ryan, female    DOB: August 30, 1946, 77 y.o.   MRN: [id number]  Chief Complaint  Patient presents with   Follow-up    Referring provider: Kaleen Mask, *  HPI: 77 year old female never smoker (secondhand smoke exposure) seen for pulmonary consult August 06, 2023 for abnormal CT chest with large right hilar mass with adenopathy Medical history significant for skin cancer  TEST/EVENTS :  07/26/2023 CTA chest: Patient with a 2 x 4.2 cm right infrahilar mass, adenopathy within the chest concerning for malignancy.  08/2019 CT Abd Low-density in the subcarinal and right infrahilar regions is stable, present since 2015 and probably an incidental cystic lesion. The lung bases are clear.  09/09/2023 Follow up : Lung mass  Patient presents for a 1 month follow-up.  Patient was seen last visit for pulmonary consult for abnormal CT chest with large right hilar mass with adenopathy.  This was concerning for underlying malignancy.  This was an incidental finding as patient had presented to the emergency room with chest and epigastric discomfort. Felt like the chicken she was eating was stuck. CT chest was negative for PE but showed large right hilar mass.  Later that night she vomited up some of the chicken . Felt much better after vomiting. Patient was set up for a PET scan that was done on August 22, 2023 that showed a decrease in size from previous 4.2 x 2.0 cm right hilar lesion now measuring 3.8 x 1.0 cm.  Low level of hypermetabolism identified in the medial portion of the soft tissue with SUV max at 3.2.  Low right peritracheal node decreased from 12 mm to 11 mm with SUV max at 2.8.  No other suspicious lymphadenopathy or pulmonary mass lesion.  Left colonic diverticulosis with adjacent subtle edema/inflammation concerning for diverticulitis. No fever, hemoptysis, blood stools, n/v/d. No weight loss.  She says overall since last visit she is feeling well.  Allergies   Allergen Reactions   Codeine Nausea Only and Other (See Comments)    Extreme headaches also   Morphine And Codeine Other (See Comments)    Extreme headaches     There is no immunization history on file for this patient.  Past Medical History:  Diagnosis Date   Renal stones    Thyroid disease     Tobacco History: Social History   Tobacco Use  Smoking Status Never  Smokeless Tobacco Never   Counseling given: Not Answered   Outpatient Medications Prior to Visit  Medication Sig Dispense Refill   benzonatate (TESSALON) 100 MG capsule Take 100 mg by mouth 3 (three) times daily as needed for cough.     furosemide (LASIX) 20 MG tablet Take 1 tablet by mouth daily as needed.     levothyroxine (SYNTHROID) 150 MCG tablet Take 1 tablet (150 mcg total) by mouth daily before breakfast. 90 tablet 3   blood glucose meter kit and supplies KIT Dispense based on patient and insurance preference. Use up to four times daily as directed. (FOR ICD-9 250.00, 250.01). (Patient not taking: Reported on 09/09/2023) 1 each 0   ibuprofen (ADVIL) 200 MG tablet Take 200 mg by mouth every 6 (six) hours as needed for fever, headache or moderate pain. (Patient not taking: Reported on 09/09/2023)     amLODipine (NORVASC) 5 MG tablet Take 1 tablet (5 mg total) by mouth daily. (Patient not taking: Reported on 09/09/2023) 30 tablet 0   metoprolol succinate (TOPROL-XL) 25 MG 24 hr tablet Take  1 tablet (25 mg total) by mouth daily. 30 tablet 0   pantoprazole (PROTONIX) 20 MG tablet Take 1 tablet (20 mg total) by mouth daily for 14 days. 14 tablet 0   rosuvastatin (CRESTOR) 10 MG tablet Take 1 tablet (10 mg total) by mouth daily. 30 tablet 0   No facility-administered medications prior to visit.     Review of Systems:   Constitutional:   No  weight loss, night sweats,  Fevers, chills, fatigue, or  lassitude.  HEENT:   No headaches,  Difficulty swallowing,  Tooth/dental problems, or  Sore throat,                 No sneezing, itching, ear ache, nasal congestion, post nasal drip,   CV:  No chest pain,  Orthopnea, PND, swelling in lower extremities, anasarca, dizziness, palpitations, syncope.   GI  No heartburn, indigestion, abdominal pain, nausea, vomiting, diarrhea, change in bowel habits, loss of appetite, bloody stools.   Resp: No shortness of breath with exertion or at rest.  No excess mucus, no productive cough,  No non-productive cough,  No coughing up of blood.  No change in color of mucus.  No wheezing.  No chest wall deformity  Skin: no rash or lesions.  GU: no dysuria, change in color of urine, no urgency or frequency.  No flank pain, no hematuria   MS:  No joint pain or swelling.  No decreased range of motion.  No back pain.    Physical Exam  BP 122/80 (BP Location: Left Arm, Patient Position: Sitting, Cuff Size: Large)   Pulse 83   Temp 97.6 F (36.4 C) (Oral)   Ht 5\' 5"  (1.651 m)   Wt 248 lb 12.8 oz (112.9 kg)   SpO2 95%   BMI 41.40 kg/m   GEN: A/Ox3; pleasant , NAD, well nourished    HEENT:  [place]/AT,   NOSE-clear, THROAT-clear, no lesions, no postnasal drip or exudate noted.   NECK:  Supple w/ fair ROM; no JVD; normal carotid impulses w/o bruits; no thyromegaly or nodules palpated; no lymphadenopathy.    RESP  Clear  P & A; w/o, wheezes/ rales/ or rhonchi. no accessory muscle use, no dullness to percussion  CARD:  RRR, no m/r/g, no peripheral edema, pulses intact, no cyanosis or clubbing.  GI:   Soft & nt; nml bowel sounds; no organomegaly or masses detected.   Musco: Warm bil, no deformities or joint swelling noted.   Neuro: alert, no focal deficits noted.    Skin: Warm, no lesions or rashes    Lab Results:  CBC   BNP   ProBNP No results found for: "PROBNP"  Imaging: NM PET Image Initial (PI) Skull Base To Thigh (F-18 FDG)  Result Date: 09/05/2023 CLINICAL DATA:  Initial treatment strategy for mediastinal mass. EXAM: NUCLEAR MEDICINE PET SKULL BASE TO  THIGH TECHNIQUE: 11.9 mCi F-18 FDG was injected intravenously. Full-ring PET imaging was performed from the skull base to thigh after the radiotracer. CT data was obtained and used for attenuation correction and anatomic localization. Fasting blood glucose: 107 mg/dl COMPARISON:  Chest CTA 07/26/2023 FINDINGS: Mediastinal blood pool activity: SUV max 4.0 Liver activity: SUV max NA NECK: No hypermetabolic lymph nodes in the neck. Incidental CT findings: None. CHEST: The 4.2 x 2.0 cm right posterior hilar lesion, just cranial to the right inferior pulmonary vein on the previous study has decreased in the interval, not well seen on noncontrast imaging today but measuring approximately 3.8 x  1.0 cm on image 84/4. Low level hypermetabolism identified in the medial portion of this soft tissue with SUV max = 3.2. 17 mm short axis node just posterior to the bronchus intermedius on the previous study is 11 mm today on image 77/4. SUV max = 3.1. Low right paratracheal node measured previously at 12 mm short axis is 11 mm today on 67/4 with SUV max = 2.8. No other suspicious lymphadenopathy or pulmonary mass lesion. Incidental CT findings: Mild atherosclerotic calcification is noted in the wall of the thoracic aorta. Mitral annular calcification evident. No suspicious pulmonary nodule or mass. ABDOMEN/PELVIS: No abnormal hypermetabolic activity within the liver, pancreas, adrenal glands, or spleen. No hypermetabolic lymph nodes in the abdomen or pelvis. FDG accumulation in the stomach, small bowel, and colon is most likely physiologic. Incidental CT findings: Gallbladder surgically absent. There is moderate atherosclerotic calcification of the abdominal aorta without aneurysm. Left colonic diverticulosis. There is an ill-defined diverticulum along the anteromedial wall the distal descending colon (image 151/4) demonstrating subtle surrounding edema/inflammation. SKELETON: No focal hypermetabolic activity to suggest skeletal  metastasis. No worrisome lytic or sclerotic osseous abnormality. Incidental CT findings: None. IMPRESSION: 1. Interval decrease in size of the right posterior hilar soft tissue lesion and mediastinal lymph nodes. Low level FDG uptake in these lesions is nonspecific and could be reactive. Well differentiated or low-grade neoplasm is not excluded. 2. No evidence for suspicious hypermetabolic disease in the neck, abdomen, or pelvis. 3. Left colonic diverticulosis with ill-defined diverticulum along the anteromedial wall the distal descending colon demonstrating adjacent subtle edema/inflammation. Imaging features are compatible with acute diverticulitis. 4.  Aortic Atherosclerosis (ICD10-I70.0). These results will be called to the ordering clinician or representative by the Radiologist Assistant, and communication documented in the PACS or [employer]. Electronically Signed   By: Kennith Center M.D.   On: 09/05/2023 12:25    Administration History     None           No data to display          No results found for: "NITRICOXIDE"      Assessment & Plan:   Lung mass Right infrahilar mass noted on CT August 2024.  This measured 2.0 x 4.2 cm with associated adenopathy.  Patient was set up for a subsequent PET scan that was done on September 13 that showed a slight decrease in size from 4.2 x 2.0 to 3.8 x 1.0.  There was low levels of hypermetabolism.  And decrease size and right paratracheal node with SUV max at 2.8.  We discussed her PET scan results in detail.  Went over options including proceeding with tissue sampling/bronchoscopy-versus serial imaging.  Patient says that she wants to wait and hold off on biopsy at this time.  For now will repeat CT chest in 6 weeks.  If no significant change or growth will need to consider tissue sampling.    Diverticulosis Diverticulosis/possible diverticulitis.  Patient has no significant abdominal symptoms.  Of asked patient to discuss these test  results with her primary care provider.  May need to go for referral for GI.  Patient has never had a previous colonoscopy.     Rubye Oaks, NP 09/09/2023

## 2023-09-09 NOTE — Patient Instructions (Addendum)
Set up for CT chest without contrast in 6 weeks to follow lung mass.  Follow up with Dr. Jeannetta Nap regarding about colon finding on PET. May need GI referral.  Follow up in 8 weeks with Dr. Tonia Brooms after CT chest .

## 2023-09-09 NOTE — Assessment & Plan Note (Signed)
Diverticulosis/possible diverticulitis.  Patient has no significant abdominal symptoms.  Of asked patient to discuss these test results with her primary care provider.  May need to go for referral for GI.  Patient has never had a previous colonoscopy.

## 2023-09-09 NOTE — Assessment & Plan Note (Signed)
Right infrahilar mass noted on CT August 2024.  This measured 2.0 x 4.2 cm with associated adenopathy.  Patient was set up for a subsequent PET scan that was done on September 13 that showed a slight decrease in size from 4.2 x 2.0 to 3.8 x 1.0.  There was low levels of hypermetabolism.  And decrease size and right paratracheal node with SUV max at 2.8.  We discussed her PET scan results in detail.  Went over options including proceeding with tissue sampling/bronchoscopy-versus serial imaging.  Patient says that she wants to wait and hold off on biopsy at this time.  For now will repeat CT chest in 6 weeks.  If no significant change or growth will need to consider tissue sampling.

## 2023-09-10 NOTE — Progress Notes (Signed)
Discussed at last office visit with Rubye Oaks, NP   Thanks,  BLI  Josephine Igo, DO  Pulmonary Critical Care 09/10/2023 4:23 PM

## 2023-10-24 ENCOUNTER — Ambulatory Visit (HOSPITAL_COMMUNITY)
Admission: RE | Admit: 2023-10-24 | Discharge: 2023-10-24 | Disposition: A | Discharge status: Home / Self Care | Payer: Medicare Other | Source: Ambulatory Visit | Attending: Family Medicine | Admitting: Family Medicine

## 2023-10-24 DIAGNOSIS — R911 Solitary pulmonary nodule: Secondary | ICD-10-CM | POA: Diagnosis present

## 2023-10-31 ENCOUNTER — Ambulatory Visit: Payer: Medicare Other | Admitting: Cardiology

## 2023-11-12 ENCOUNTER — Encounter: Payer: Self-pay | Admitting: *Deleted

## 2023-11-12 NOTE — Progress Notes (Signed)
ATC x1.  No answer.  LVM to return call.

## 2023-11-12 NOTE — Telephone Encounter (Signed)
Patient is returning phone call. Patient phone number is [phone].

## 2023-11-13 ENCOUNTER — Ambulatory Visit: Payer: Medicare Other | Admitting: Adult Health

## 2023-11-28 NOTE — Progress Notes (Signed)
ATC x2.  LVM to return call after 8:30 am on 12/01/23 as the office is currently closed.

## 2023-12-01 NOTE — Telephone Encounter (Signed)
PT is ret [facility] call from last week. Pls call @ [phone]

## 2023-12-01 NOTE — Telephone Encounter (Signed)
Results have been relayed to the patient/authorized caretaker. The patient/authorized caretaker verbalized understanding. No questions at this time.   

## 2023-12-05 ENCOUNTER — Ambulatory Visit: Payer: Medicare Other | Admitting: Primary Care

## 2023-12-19 ENCOUNTER — Telehealth: Payer: Medicare Other | Admitting: Primary Care

## 2024-01-01 ENCOUNTER — Ambulatory Visit: Payer: Medicare Other | Admitting: Internal Medicine

## 2024-01-13 ENCOUNTER — Encounter: Payer: Self-pay | Admitting: Internal Medicine

## 2024-01-13 ENCOUNTER — Ambulatory Visit: Payer: Medicare Other | Admitting: Internal Medicine

## 2024-01-13 VITALS — BP 124/68 | HR 93 | Ht 65.0 in | Wt 249.4 lb

## 2024-01-13 DIAGNOSIS — E039 Hypothyroidism, unspecified: Secondary | ICD-10-CM | POA: Diagnosis not present

## 2024-01-13 NOTE — Progress Notes (Addendum)
 Patient ID: Abigail Ryan, female   DOB: 12-02-1946, 78 y.o.   MRN: [id number]  HPI  Abigail Ryan is a 78 y.o.-year-old female, initially referred by her PCP, Dr.  Loring returning for follow-up for uncontrolled hypothyroidism.  Last visit 6 months ago.  Interim history: She was dx'ed with gout in L foot - on Colchicine. She also pain in R ankle after she fell from a ladder >20 years ago. She had eye surgery last week.  She still has a glare but overall she feels that this is getting better.  Reviewed and addended history: Pt. was referred to endocrinology after presenting to the emergency room recently after being off her levothyroxine  for 3 months.  She mentions that there was a disagreement with her PCP (Dr. Burney) regarding the frequency of thyroid  checks, so she did not have her levothyroxine  refilled.  At the time of her ED visit, she had shortness of breath and neck swelling.  A TSH was found to be very elevated, at 58.  She was given a prescription for levothyroxine  and referred to endocrinology.  She describes that she was dx'ed with hypothyroidism at [age] y/o by screening TSH. She was started LT4 >> increasing doses, but for the last few years, she was on the 150 mcg daily with good control.  At our first visit from 02/2023, we continued LT4 150 mcg daily.  She takes the levothyroxine : - in am - fasting - with water - at least 30-60 min from b'fast - + calcium  at night - no iron - no multivitamins - no PPIs - not on Biotin; she has not been taking B complex in a long time.  I reviewed pt's thyroid  tests: Lab Results  Component Value Date   TSH 3.07 07/02/2023   TSH 2.19 03/19/2023   TSH 58.165 (H) 02/02/2023   TSH 0.540 01/25/2019   FREET4 0.93 07/02/2023   FREET4 1.08 03/19/2023   FREET4 <0.25 (L) 02/02/2023   T3FREE 0.5 (L) 02/02/2023   Antithyroid antibodies: No results found for: THGAB No components found for: TPOAB  CT neck (02/03/2023): Thyroid : No  visible thyroid  tissue   Pt denies: - dysphagia - choking - SOB with lying down  But has: - hoarseness  She has no FH of thyroid  disorders. No FH of thyroid  cancer. No h/o radiation tx to head or neck. No recent use of iodine supplements. + herbal supplements (Cardio Vibe). No recent steroid use.  Pt. also has a history of CHF, paroxysmal atrial fibrillation, A-fib with RVR, history of stroke, mixed hyperlipidemia, and colitis. She also has a history of diabetes but I do not have her most recent records: Lab Results  Component Value Date   HGBA1C 7.0 (H) 02/19/2019   ROS: + See HPI  Past Medical History:  Diagnosis Date   Renal stones    Thyroid  disease    Past Surgical History:  Procedure Laterality Date   ABDOMINAL HYSTERECTOMY     APPENDECTOMY     CHOLECYSTECTOMY     Social History   Socioeconomic History   Marital status: Married    Spouse name: Not on file   Number of children: Not on file   Years of education: Not on file   Highest education level: Not on file  Occupational History   Occupation: Retired  Tobacco Use   Smoking status: Never   Smokeless tobacco: Never  Vaping Use   Vaping status: Never Used  Substance and Sexual Activity   Alcohol   use: No   Drug use: No   Sexual activity: Not on file  Other Topics Concern   Not on file  Social History Narrative   Lives in [place] w/ husband, who has Alzheimer's   Social Drivers of Corporate Investment Banker Strain: Not on file  Food Insecurity: Not on file  Transportation Needs: Not on file  Physical Activity: Not on file  Stress: Not on file  Social Connections: Not on file  Intimate Partner Violence: Not on file   Current Outpatient Medications on File Prior to Visit  Medication Sig Dispense Refill   blood glucose meter kit and supplies KIT Dispense based on patient and insurance preference. Use up to four times daily as directed. (FOR ICD-9 250.00, 250.01). 1 each 0   colchicine 0.6 MG  tablet Take by mouth.     furosemide  (LASIX ) 20 MG tablet Take 1 tablet by mouth daily as needed.     ibuprofen  (ADVIL ) 200 MG tablet Take 200 mg by mouth every 6 (six) hours as needed for fever, headache or moderate pain (pain score 4-6).     levothyroxine  (SYNTHROID ) 150 MCG tablet Take 1 tablet (150 mcg total) by mouth daily before breakfast. 90 tablet 3   benzonatate  (TESSALON ) 100 MG capsule Take 100 mg by mouth 3 (three) times daily as needed for cough. (Patient not taking: Reported on 01/13/2024)     No current facility-administered medications on file prior to visit.   Allergies  Allergen Reactions   Codeine Nausea Only and Other (See Comments)    Extreme headaches also   Morphine And Codeine Other (See Comments)    Extreme headaches   Family History  Problem Relation Age of Onset   Breast cancer Daughter    Heart disease Mother 62   Heart disease Father 11   PE: BP 124/68   Pulse 93   Ht 5' 5 (1.651 m)   Wt 249 lb 6.4 oz (113.1 kg)   SpO2 94%   BMI 41.50 kg/m  Wt Readings from Last 3 Encounters:  01/13/24 249 lb 6.4 oz (113.1 kg)  09/09/23 248 lb 12.8 oz (112.9 kg)  08/06/23 243 lb 6.4 oz (110.4 kg)   Constitutional: overweight, in NAD, walks with a cane Eyes:  EOMI, no exophthalmos ENT: no neck masses, no cervical lymphadenopathy Cardiovascular: RRR, No MRG Respiratory: CTA B Musculoskeletal: no deformities Skin:no rashes Neurological: no tremor with outstretched hands  ASSESSMENT: 1.  Acquired hypothyroidism  PLAN:  1. Patient with longstanding hypothyroidism, on levothyroxine  therapy.  Before our first visit, she was off her levothyroxine  for 3 months as she was not able to refill the prescription.  She developed shortness of breath and neck swelling along with significant weakness and fatigue.  TSH was very high and she presented to the emergency room.  She was given a prescription for levothyroxine .  At our first visit, she already had started to take the  levothyroxine  and symptoms were improving.  We discussed about possible consequences of uncontrolled hypothyroidism including immediate complications but also long-term: CHF, pericarditis, cognitive problems.  She did not have gaps in levothyroxine  dosing since last visit. -Latest TSH reviewed and this was normal: Lab Results  Component Value Date   TSH 3.07 07/02/2023  - she continues on LT4 150 mcg daily - pt feels good on this dose.  She has no complaints at today's visit other than foot pain due to gout and also an old ankle injury. - we discussed about  taking the thyroid  hormone every day, with water, >30 minutes before breakfast, separated by >4 hours from acid reflux medications, calcium , iron, multivitamins. Pt. is taking it correctly. - will check thyroid  tests today: TSH and fT4 - If labs are abnormal, she will need to return for repeat TFTs in 1.5 months -OTW, I will see her back in a year  Orders Placed This Encounter  Procedures   TSH   T4, free   Needs refills.   Component     Latest Ref Rng 01/13/2024  TSH     0.40 - 4.50 mIU/L 1.22   T4,Free(Direct)     0.8 - 1.8 ng/dL 1.3   TFTs normal.  Lela Fendt, MD PhD [facility] Endocrinology

## 2024-01-13 NOTE — Patient Instructions (Signed)
Please continue Levothyroxine 150 mcg daily.  Take the thyroid hormone every day, with water, at least 30 minutes before breakfast, separated by at least 4 hours from: - acid reflux medications - calcium - iron - multivitamins  Please stop at the lab.  Please return in 1 year.

## 2024-01-14 ENCOUNTER — Encounter: Payer: Self-pay | Admitting: Internal Medicine

## 2024-01-14 LAB — TSH: TSH: 1.22 m[IU]/L (ref 0.40–4.50)

## 2024-01-14 LAB — T4, FREE: Free T4: 1.3 ng/dL (ref 0.8–1.8)

## 2024-01-14 MED ORDER — LEVOTHYROXINE SODIUM 150 MCG PO TABS: 150.0000 ug | ORAL_TABLET | Freq: Every day | ORAL | 3 refills | Status: AC

## 2024-01-14 NOTE — Addendum Note (Signed)
 Addended by: Emilie Harden on: 01/14/2024 09:42 AM   Modules accepted: Orders

## 2024-01-28 ENCOUNTER — Ambulatory Visit: Payer: Medicare Other | Admitting: Emergency Medicine

## 2024-02-19 NOTE — Progress Notes (Unsigned)
 Cardiology Office Note:  .   Date:  02/20/2024  ID:  Abigail Ryan, DOB 12/15/1945, MRN [id number] PCP: Kaleen Mask, MD  [facility] Providers Cardiologist:  Truett Mainland, MD PCP: Kaleen Mask, MD  Chief Complaint  Patient presents with   Chest Pain   Follow-up    6 month   Shortness of Breath      History of Present Illness: .    Abigail Ryan is a 78 y.o. female with hypertension, type 2 diabetes mellitus, obesity, remote h/o paroxysmal atrial fibrillation (2020 during sepsis) with no known recurrence, with exertional dyspnea and chest pain.    I last saw the patient in 07/2023.  At that time, she was awaiting workup of her possible lung mass, and therefore had not undergone any cardiac testing.  Subsequently, she has had a PET scan in 07/2023, and a CT chest without contrast in 10/2023.  PET scan did not show any hypermetabolic activity suggesting possibly reactive etiology to masslike appearance, which improved.  She is a non-smoker and has never had lung function testing. Her exertional dyspnea remains, now occurring with as little walking as 50 feet.  She also has episodes of precordial chest pain.  He also noticed that her heart rate has been variable from 40s to 110's.  Blood pressure has been elevated.      Vitals:   02/20/24 0940  BP: (!) 160/69  Pulse: 84  Resp: 16  SpO2: 94%     ROS:  Review of Systems  Cardiovascular:  Positive for chest pain and dyspnea on exertion. Negative for leg swelling, palpitations and syncope.     Studies Reviewed: Marland Kitchen         Independently interpreted 07/2023: Hb 13.8 Cr 1.2 TSH 1.2  CT chest 10/2023: 1. Similar borderline enlarged right hilar lymph node, less well evaluated on today's study without IV contrast. No associated hypermetabolism on PET 08/22/2023, favoring a reactive etiology. 2. Aortic atherosclerosis (ICD10-I70.0). Coronary artery calcification.    Risk Assessment/Calculations:     CHA2DS2-VASc Score = 6  This indicates a 9.7% annual risk of stroke. The patient's score is based upon: CHF History: 0 HTN History: 1 Diabetes History: 1 Stroke History: 0 Vascular Disease History: 1 Age Score: 2 Gender Score: 1       Physical Exam:   Physical Exam Vitals and nursing note reviewed.  Constitutional:      General: She is not in acute distress. Neck:     Vascular: No JVD.  Cardiovascular:     Rate and Rhythm: Normal rate and regular rhythm.     Heart sounds: Normal heart sounds. No murmur heard. Pulmonary:     Effort: Pulmonary effort is normal.     Breath sounds: Normal breath sounds. No wheezing or rales.  Musculoskeletal:     Right lower leg: No edema.     Left lower leg: No edema.      VISIT DIAGNOSES:   ICD-10-CM   1. Paroxysmal A-fib (HCC)  I48.0 aspirin EC 81 MG tablet    nitroGLYCERIN (NITROSTAT) 0.4 MG SL tablet    rosuvastatin (CRESTOR) 20 MG tablet    metoprolol succinate (TOPROL XL) 25 MG 24 hr tablet    amLODipine (NORVASC) 5 MG tablet    ECHOCARDIOGRAM COMPLETE    MYOCARDIAL PERFUSION IMAGING    LONG TERM MONITOR (3-14 DAYS)    Lipid Profile    CBC    Basic metabolic panel    Pro  b natriuretic peptide (BNP)    Pro b natriuretic peptide (BNP)    Basic metabolic panel    CBC    Lipid Profile    2. Coronary artery disease of native artery of native heart with stable angina pectoris (HCC)  I25.118 EKG 12-Lead    aspirin EC 81 MG tablet    nitroGLYCERIN (NITROSTAT) 0.4 MG SL tablet    rosuvastatin (CRESTOR) 20 MG tablet    metoprolol succinate (TOPROL XL) 25 MG 24 hr tablet    amLODipine (NORVASC) 5 MG tablet    ECHOCARDIOGRAM COMPLETE    MYOCARDIAL PERFUSION IMAGING    Lipid Profile    CBC    Basic metabolic panel    Pro b natriuretic peptide (BNP)    Pro b natriuretic peptide (BNP)    Basic metabolic panel    CBC    Lipid Profile    Cardiac Stress Test: Informed Consent Details: Physician/Practitioner Attestation;  Transcribe to consent form and obtain patient signature    3. Aortic atherosclerosis (HCC)  I70.0 EKG 12-Lead    aspirin EC 81 MG tablet    nitroGLYCERIN (NITROSTAT) 0.4 MG SL tablet    rosuvastatin (CRESTOR) 20 MG tablet    metoprolol succinate (TOPROL XL) 25 MG 24 hr tablet    amLODipine (NORVASC) 5 MG tablet    ECHOCARDIOGRAM COMPLETE    MYOCARDIAL PERFUSION IMAGING    Lipid Profile    CBC    Basic metabolic panel    Pro b natriuretic peptide (BNP)    Pro b natriuretic peptide (BNP)    Basic metabolic panel    CBC    Lipid Profile    Cardiac Stress Test: Informed Consent Details: Physician/Practitioner Attestation; Transcribe to consent form and obtain patient signature    4. Exertional dyspnea  R06.09 EKG 12-Lead    aspirin EC 81 MG tablet    nitroGLYCERIN (NITROSTAT) 0.4 MG SL tablet    rosuvastatin (CRESTOR) 20 MG tablet    metoprolol succinate (TOPROL XL) 25 MG 24 hr tablet    amLODipine (NORVASC) 5 MG tablet    ECHOCARDIOGRAM COMPLETE    MYOCARDIAL PERFUSION IMAGING    Lipid Profile    CBC    Basic metabolic panel    Pro b natriuretic peptide (BNP)    Pro b natriuretic peptide (BNP)    Basic metabolic panel    CBC    Lipid Profile    Cardiac Stress Test: Informed Consent Details: Physician/Practitioner Attestation; Transcribe to consent form and obtain patient signature    5. Mixed hyperlipidemia  E78.2 aspirin EC 81 MG tablet    nitroGLYCERIN (NITROSTAT) 0.4 MG SL tablet    rosuvastatin (CRESTOR) 20 MG tablet    metoprolol succinate (TOPROL XL) 25 MG 24 hr tablet    amLODipine (NORVASC) 5 MG tablet    MYOCARDIAL PERFUSION IMAGING    Lipid Profile    CBC    Basic metabolic panel    Pro b natriuretic peptide (BNP)    Pro b natriuretic peptide (BNP)    Basic metabolic panel    CBC    Lipid Profile    6. Palpitations  R00.2 aspirin EC 81 MG tablet    nitroGLYCERIN (NITROSTAT) 0.4 MG SL tablet    rosuvastatin (CRESTOR) 20 MG tablet    metoprolol succinate  (TOPROL XL) 25 MG 24 hr tablet    amLODipine (NORVASC) 5 MG tablet    MYOCARDIAL PERFUSION IMAGING    LONG TERM MONITOR (3-14 DAYS)  Lipid Profile    CBC    Basic metabolic panel    Pro b natriuretic peptide (BNP)    Pro b natriuretic peptide (BNP)    Basic metabolic panel    CBC    Lipid Profile    7. Primary hypertension  I10 EKG 12-Lead       ASSESSMENT AND PLAN: .    Abigail Ryan is a 78 y.o. female with hypertension, type 2 diabetes mellitus, obesity, remote h/o paroxysmal atrial fibrillation (2020 during sepsis) with no known recurrence, with exertional dyspnea and chest pain.    Exertional dyspnea, chest pain: Known aortic and coronary atherosclerosis noted on CT chest before. Symptoms are concerning for obstructive coronary artery disease. Recommend echocardiogram and Lexiscan nuclear stress test. Started aspirin 81 mg daily, metoprolol succinate 25 mg daily, amlodipine 5 mg daily, sublingual nitroglycerin as needed. Patient knows to call 911, should she have chest pain is not improved with sublingual nitroglycerin. Check CBC, BMP, proBNP.  Paroxysmal A. Fib: Diagnosed during echo admission for sepsis in February 2020.  She has not had any subsequent EKGs.  She has not been on anticoagulation and has not been keen on starting anticoagulation in absence of any recurrence of A-fib. I will place her on a 2-week ZIO monitor to reassess for A-fib..  Mixed hyperlipidemia: LDL previously in 190s with known aortic and coronary atherosclerosis. Check lipid panel.   Started Crestor 20 mg daily.    Informed Consent   Shared Decision Making/Informed Consent The risks [chest pain, shortness of breath, cardiac arrhythmias, dizziness, blood pressure fluctuations, myocardial infarction, stroke/transient ischemic attack, nausea, vomiting, allergic reaction, radiation exposure, metallic taste sensation and life-threatening complications (estimated to be 1 in 10,000)], benefits  (risk stratification, diagnosing coronary artery disease, treatment guidance) and alternatives of a nuclear stress test were discussed in detail with Ms. Needles and she agrees to proceed.       Meds ordered this encounter  Medications   aspirin EC 81 MG tablet    Sig: Take 1 tablet (81 mg total) by mouth daily. Swallow whole.    Dispense:  90 tablet    Refill:  2   nitroGLYCERIN (NITROSTAT) 0.4 MG SL tablet    Sig: Place 1 tablet (0.4 mg total) under the tongue every 5 (five) minutes as needed for chest pain.    Dispense:  25 tablet    Refill:  5   rosuvastatin (CRESTOR) 20 MG tablet    Sig: Take 1 tablet (20 mg total) by mouth daily.    Dispense:  90 tablet    Refill:  2   metoprolol succinate (TOPROL XL) 25 MG 24 hr tablet    Sig: Take 1 tablet (25 mg total) by mouth daily.    Dispense:  90 tablet    Refill:  2   amLODipine (NORVASC) 5 MG tablet    Sig: Take 1 tablet (5 mg total) by mouth daily.    Dispense:  90 tablet    Refill:  2     F/u in 4-6 weeks  Signed, Elder Negus, MD

## 2024-02-20 ENCOUNTER — Ambulatory Visit: Attending: Cardiology | Admitting: Cardiology

## 2024-02-20 ENCOUNTER — Ambulatory Visit (INDEPENDENT_AMBULATORY_CARE_PROVIDER_SITE_OTHER)

## 2024-02-20 ENCOUNTER — Encounter: Payer: Self-pay | Admitting: *Deleted

## 2024-02-20 ENCOUNTER — Encounter: Payer: Self-pay | Admitting: Cardiology

## 2024-02-20 VITALS — BP 160/69 | HR 84 | Resp 16 | Ht 65.0 in | Wt 250.0 lb

## 2024-02-20 DIAGNOSIS — I480 Paroxysmal atrial fibrillation: Secondary | ICD-10-CM | POA: Diagnosis not present

## 2024-02-20 DIAGNOSIS — I25118 Atherosclerotic heart disease of native coronary artery with other forms of angina pectoris: Secondary | ICD-10-CM

## 2024-02-20 DIAGNOSIS — E782 Mixed hyperlipidemia: Secondary | ICD-10-CM

## 2024-02-20 DIAGNOSIS — I10 Essential (primary) hypertension: Secondary | ICD-10-CM | POA: Insufficient documentation

## 2024-02-20 DIAGNOSIS — R002 Palpitations: Secondary | ICD-10-CM

## 2024-02-20 DIAGNOSIS — R0602 Shortness of breath: Secondary | ICD-10-CM | POA: Insufficient documentation

## 2024-02-20 DIAGNOSIS — I700 Atherosclerosis of aorta: Secondary | ICD-10-CM | POA: Diagnosis not present

## 2024-02-20 DIAGNOSIS — R0609 Other forms of dyspnea: Secondary | ICD-10-CM

## 2024-02-20 LAB — CBC
Hematocrit: 39.2 % (ref 34.0–46.6)
Hemoglobin: 12.7 g/dL (ref 11.1–15.9)
MCH: 29.7 pg (ref 26.6–33.0)
MCHC: 32.4 g/dL (ref 31.5–35.7)
MCV: 92 fL (ref 79–97)
Platelets: 258 10*3/uL (ref 150–450)
RBC: 4.27 x10E6/uL (ref 3.77–5.28)
RDW: 13.1 % (ref 11.7–15.4)
WBC: 6.5 10*3/uL (ref 3.4–10.8)

## 2024-02-20 MED ORDER — ASPIRIN 81 MG PO TBEC
81.0000 mg | DELAYED_RELEASE_TABLET | Freq: Every day | ORAL | 2 refills | Status: DC
Start: 2024-02-20 — End: 2024-11-04

## 2024-02-20 MED ORDER — NITROGLYCERIN 0.4 MG SL SUBL
0.4000 mg | SUBLINGUAL_TABLET | SUBLINGUAL | 5 refills | Status: AC | PRN
Start: 2024-02-20 — End: ?

## 2024-02-20 MED ORDER — METOPROLOL SUCCINATE ER 25 MG PO TB24
25.0000 mg | ORAL_TABLET | Freq: Every day | ORAL | 2 refills | Status: DC
Start: 2024-02-20 — End: 2024-11-04

## 2024-02-20 MED ORDER — AMLODIPINE BESYLATE 5 MG PO TABS
5.0000 mg | ORAL_TABLET | Freq: Every day | ORAL | 2 refills | Status: DC
Start: 2024-02-20 — End: 2024-11-04

## 2024-02-20 MED ORDER — ROSUVASTATIN CALCIUM 20 MG PO TABS
20.0000 mg | ORAL_TABLET | Freq: Every day | ORAL | 2 refills | Status: DC
Start: 2024-02-20 — End: 2024-11-04

## 2024-02-20 NOTE — Patient Instructions (Addendum)
 Medication Instructions:   START TAKING ASPIRIN 81 MG BY MOUTH DAILY  START TAKING AMLODIPINE 5 MG BY MOUTH DAILY  START TAKING METOPROLOL SUCCINATE (TOPROL XL) 25 MG BY MOUTH DAILY  START TAKING NITROGLYCERIN 0.4 MG  SUBLINGUAL (UNDER THE TONGUE)--PLACE ONE TABLET UNDER THE TONGUE EVERY 5 MINS AS NEEDED FOR CHEST PAIN--IF YOU ARE GETTING TO TABLET #3 WITH NO CHEST PAIN RELIEF PLEASE CALL 911  START TAKING CRESTOR 20 MG BY MOUTH DAILY  *If you need a refill on your cardiac medications before your next appointment, please call your pharmacy*   Lab Work:  TODAY OR SOMETIME NEXT WEEK DOWNSTAIRS FIRST FLOOR AT [facility]--BMET, CBC, PRO-BNP, AND LIPIDS  If you have labs (blood work) drawn today and your tests are completely normal, you will receive your results only by: MyChart Message (if you have MyChart) OR A paper copy in the mail If you have any lab test that is abnormal or we need to change your treatment, we will call you to review the results.   Testing/Procedures:  Your physician has requested that you have an echocardiogram. Echocardiography is a painless test that uses sound waves to create images of your heart. It provides your doctor with information about the size and shape of your heart and how well your heart's chambers and valves are working. This procedure takes approximately one hour. There are no restrictions for this procedure. Please do NOT wear cologne, perfume, aftershave, or lotions (deodorant is allowed). Please arrive 15 minutes prior to your appointment time.  Please note: We ask at that you not bring children with you during ultrasound (echo/ vascular) testing. Due to room size and safety concerns, children are not allowed in the ultrasound rooms during exams. Our front office staff cannot provide observation of children in our lobby area while testing is being conducted. An adult accompanying a patient to their appointment will only be allowed in the ultrasound  room at the discretion of the ultrasound technician under special circumstances. We apologize for any inconvenience.   Your physician has requested that you have a lexiscan myoview. For further information please visit [email or web address] Please follow instruction sheet, as given.    ZIO XT- Long Term Monitor Instructions  Your physician has requested you wear a ZIO patch monitor for 14 days.   WILL BE PLACED IN THE CLINIC TODAY This is a single patch monitor. [employer] supplies one patch monitor per enrollment. Additional stickers are not available. Please do not apply patch if you will be having a Nuclear Stress Test,  Echocardiogram, Cardiac CT, MRI, or Chest Xray during the period you would be wearing the  monitor. The patch cannot be worn during these tests. You cannot remove and re-apply the  ZIO XT patch monitor.  Your ZIO patch monitor will be mailed 3 day USPS to your address on file. It may take 3-5 days  to receive your monitor after you have been enrolled.  Once you have received your monitor, please review the enclosed instructions. Your monitor  has already been registered assigning a specific monitor serial # to you.  Billing and Patient Assistance Program Information  We have supplied [employer] with any of your insurance information on file for billing purposes. [employer] offers a sliding scale Patient Assistance Program for patients that do not have  insurance, or whose insurance does not completely cover the cost of the ZIO monitor.  You must apply for the Patient Assistance Program to qualify for this discounted rate.  To apply,  please call [employer] at [phone], select option 4, select option 2, ask to apply for  Patient Assistance Program. Meredeth Ide will ask your household income, and how many people  are in your household. They will quote your out-of-pocket cost based on that information.  [employer] will also be able to set up a 71-month, interest-free payment plan if  needed.  Applying the monitor   Shave hair from upper left chest.  Hold abrader disc by orange tab. Rub abrader in 40 strokes over the upper left chest as  indicated in your monitor instructions.  Clean area with 4 enclosed alcohol pads. Let dry.  Apply patch as indicated in monitor instructions. Patch will be placed under collarbone on left  side of chest with arrow pointing upward.  Rub patch adhesive wings for 2 minutes. Remove white label marked "1". Remove the white  label marked "2". Rub patch adhesive wings for 2 additional minutes.  While looking in a mirror, press and release button in center of patch. A small green light will  flash 3-4 times. This will be your only indicator that the monitor has been turned on.  Do not shower for the first 24 hours. You may shower after the first 24 hours.  Press the button if you feel a symptom. You will hear a small click. Record Date, Time and  Symptom in the Patient Logbook.  When you are ready to remove the patch, follow instructions on the last 2 pages of Patient  Logbook. Stick patch monitor onto the last page of Patient Logbook.  Place Patient Logbook in the blue and white box. Use locking tab on box and tape box closed  securely. The blue and white box has prepaid postage on it. Please place it in the mailbox as  soon as possible. Your physician should have your test results approximately 7 days after the  monitor has been mailed back to [facility].  Call [facility] Customer Care at [phone] if you have questions regarding  your ZIO XT patch monitor. Call them immediately if you see an orange light blinking on your  monitor.  If your monitor falls off in less than 4 days, contact our Monitor department at [phone].  If your monitor becomes loose or falls off after 4 days call [employer] at [phone] for  suggestions on securing your monitor   Follow-Up:  6 WEEKS WITH AN EXTENDER IN THE OFFICE

## 2024-02-20 NOTE — Progress Notes (Unsigned)
 ZIO serial # [id number] from office inventory applied to patient.

## 2024-02-21 LAB — BASIC METABOLIC PANEL
BUN/Creatinine Ratio: 12 (ref 12–28)
BUN: 11 mg/dL (ref 8–27)
CO2: 24 mmol/L (ref 20–29)
Calcium: 9.5 mg/dL (ref 8.7–10.3)
Chloride: 100 mmol/L (ref 96–106)
Creatinine, Ser: 0.92 mg/dL (ref 0.57–1.00)
Glucose: 103 mg/dL — ABNORMAL HIGH (ref 70–99)
Potassium: 4.9 mmol/L (ref 3.5–5.2)
Sodium: 141 mmol/L (ref 134–144)
eGFR: 63 mL/min/{1.73_m2} (ref >59)

## 2024-02-21 LAB — LIPID PANEL
Chol/HDL Ratio: 4.5 ratio — ABNORMAL HIGH (ref 0.0–4.4)
Cholesterol, Total: 244 mg/dL — ABNORMAL HIGH (ref 100–199)
HDL: 54 mg/dL (ref >39)
LDL Chol Calc (NIH): 175 mg/dL — ABNORMAL HIGH (ref 0–99)
Triglycerides: 88 mg/dL (ref 0–149)
VLDL Cholesterol Cal: 15 mg/dL (ref 5–40)

## 2024-02-21 LAB — PRO B NATRIURETIC PEPTIDE: NT-Pro BNP: 244 pg/mL (ref 0–738)

## 2024-02-21 NOTE — Progress Notes (Signed)
 Cholesterol is very high. Continue Crestor 20 mg started on Friday. Repeat lipid panel in June May 2025.  Thanks MJP

## 2024-02-23 ENCOUNTER — Other Ambulatory Visit: Payer: Self-pay

## 2024-02-23 DIAGNOSIS — E782 Mixed hyperlipidemia: Secondary | ICD-10-CM

## 2024-03-09 ENCOUNTER — Telehealth (HOSPITAL_COMMUNITY): Payer: Self-pay

## 2024-03-09 NOTE — Telephone Encounter (Signed)
 Detailed instructions left on the patient's answering machine. Asked to call back with any questions.  S.Alessa Mazur CCT

## 2024-03-12 ENCOUNTER — Telehealth (HOSPITAL_COMMUNITY): Payer: Self-pay | Admitting: Cardiology

## 2024-03-12 NOTE — Telephone Encounter (Signed)
 Patient called and cancelled echocardiogram and Myoview for reason below:  03/12/2024 8:04 AM By: Andreas Blower  Cancel Rsn: Patient (Will be out of town. Will c/b to r/s)   Order will be removed from the active echo/nuc WQ. When patient calls back we will reinstate the orders. Thank you.

## 2024-03-15 ENCOUNTER — Ambulatory Visit (HOSPITAL_COMMUNITY)

## 2024-03-16 ENCOUNTER — Ambulatory Visit (HOSPITAL_COMMUNITY)

## 2024-03-18 ENCOUNTER — Encounter: Payer: Self-pay | Admitting: Emergency Medicine

## 2024-03-18 ENCOUNTER — Ambulatory Visit: Payer: Medicare Other | Admitting: Emergency Medicine

## 2024-03-18 VITALS — BP 139/83 | HR 77 | Ht 64.0 in | Wt 249.2 lb

## 2024-03-18 DIAGNOSIS — R918 Other nonspecific abnormal finding of lung field: Secondary | ICD-10-CM | POA: Diagnosis not present

## 2024-03-18 DIAGNOSIS — R590 Localized enlarged lymph nodes: Secondary | ICD-10-CM | POA: Diagnosis not present

## 2024-03-18 NOTE — Assessment & Plan Note (Signed)
 Unclear etiology but question inflammatory.  No other lab work or evaluation to suggest lymphoma.  We have been following with serial imaging  Right hilar mass Mass decreased in size with low-level hypermetabolism. Differential includes benign lymphadenopathy versus lymphoma, with lymphoma less likely. Bronchoscopy deferred due to size reduction and lack of symptoms. - Order CT chest with contrast in May 2025. - Review CT results to determine need for further investigation or biopsy. - Schedule follow-up appointment post-CT scan in May 2025.

## 2024-03-18 NOTE — Patient Instructions (Signed)
 VISIT SUMMARY:  You came in today for a follow-up evaluation of a right hilar mass that was initially found on a CT scan. The mass has decreased in size and you have no symptoms related to it. You feel well and remain active.  YOUR PLAN:  -RIGHT HILAR MASS: A right hilar mass is an abnormal growth located near the lung's hilum. Your mass has decreased in size and shows low-level activity, which is a good sign. We will monitor it with a CT scan with contrast in May 2025 to determine if further investigation or a biopsy is needed. No immediate action is required since you have no symptoms.  INSTRUCTIONS:  Please schedule a CT chest with contrast in May 2025 and a follow-up appointment to review the results.

## 2024-03-18 NOTE — Progress Notes (Signed)
 Subjective:    Patient ID: Abigail Ryan, female    DOB: 09-Aug-1946, 78 y.o.   MRN: [id number]  HPI Abigail Ryan is a 78 year old female who presents for follow-up evaluation of a right hilar mass. She was referred by Dr. Tonia Brooms for evaluation of a right hilar mass noted on CT scan.  A right hilar mass was initially identified on a CT scan of the chest on August 06, 2023, which was performed due to chest discomfort she experienced, suspecting it might be related to swallowing a piece of chicken. Subsequent imaging, including a PET scan on August 22, 2023, showed a decrease in the size of the mass from 4.2 x 2.0 cm to approximately 3.8 x 1.0 cm, with low-level hypermetabolism (SUV of 3.2). A right hilar node decreased from 17 mm to 11 mm, and a low right paratracheal node remained stable at 11 mm. The most recent CT scan on October 24, 2023, was non-contrasted, making the hilar regions difficult to evaluate, but it appeared that the right infrahilar nodal area was similar in size at 1.5 cm. She has no symptoms related to the mass, stating, 'It's not hurt me at all. I didn't know it was there.'  No current respiratory symptoms. She feels well and remains active, working regularly. She mentions a history of gout in her foot in November, which she describes as a challenging experience.  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 She denies any history of smoking and has no known allergies to IV contrast. She reports good overall health, stating, 'I'm really basically a healthy [age]-year-old woman.'   LABS Serum creatinine: 0.92 (02/16/2024)  RADIOLOGY CT chest (07/26/2023): No evidence of pulmonary embolism. 2.0 x 4.2 cm mass lesion in the right infrahilar region suggestive of adenopathy. Additional mediastinal adenopathy noted on the right. PET scan (08/22/2023): 4.2 x 2.0 cm  posterior right hilar mass lesion decreased in size to approximately 3.8 x 1.0 cm. Low level hypermetabolism present with SUV of 3.2. Right hilar node decreased in size from 17 mm to 11 mm. Low right paratracheal node stable at 11 mm. CT chest (10/24/2023): Noncontrasted study. Hilar regions difficult to evaluate. Right infrahilar nodal area similar in size at 1.5 cm.   Review of Systems As per HPI  Past Medical History:  Diagnosis Date   Renal stones    Thyroid disease      Family History  Problem Relation Age of Onset   Breast cancer Daughter    Heart disease Mother 18   Heart disease Father 60     Social History   Socioeconomic History   Marital status: Married    Spouse name: Not on file   Number of children: Not on file   Years of education: Not on file   Highest education level: Not on file  Occupational History   Occupation: Retired  Tobacco Use   Smoking status: Never   Smokeless tobacco: Never  Vaping Use   Vaping status: Never Used  Substance and Sexual Activity   Alcohol use: No   Drug use: No   Sexual activity: Not on file  Other Topics Concern   Not on file  Social History Narrative   Lives in [place] w/ husband, who has Alzheimer's   Social Drivers of Corporate investment banker Strain: Not on file  Food Insecurity: Not on file  Transportation  Needs: Not on file  Physical Activity: Not on file  Stress: Not on file  Social Connections: Not on file  Intimate Partner Violence: Not on file     Allergies  Allergen Reactions   Codeine Nausea Only and Other (See Comments)    Extreme headaches also   Morphine And Codeine Other (See Comments)    Extreme headaches     Outpatient Medications Prior to Visit  Medication Sig Dispense Refill   aspirin EC 81 MG tablet Take 1 tablet (81 mg total) by mouth daily. Swallow whole. 90 tablet 2   colchicine 0.6 MG tablet Take by mouth.     levothyroxine (SYNTHROID) 150 MCG tablet Take 1 tablet (150 mcg total)  by mouth daily before breakfast. 90 tablet 3   nitroGLYCERIN (NITROSTAT) 0.4 MG SL tablet Place 1 tablet (0.4 mg total) under the tongue every 5 (five) minutes as needed for chest pain. 25 tablet 5   amLODipine (NORVASC) 5 MG tablet Take 1 tablet (5 mg total) by mouth daily. (Patient not taking: Reported on 03/18/2024) 90 tablet 2   benzonatate (TESSALON) 100 MG capsule Take 100 mg by mouth 3 (three) times daily as needed for cough. (Patient not taking: Reported on 03/18/2024)     blood glucose meter kit and supplies KIT Dispense based on patient and insurance preference. Use up to four times daily as directed. (FOR ICD-9 250.00, 250.01). (Patient not taking: Reported on 03/18/2024) 1 each 0   metoprolol succinate (TOPROL XL) 25 MG 24 hr tablet Take 1 tablet (25 mg total) by mouth daily. (Patient not taking: Reported on 03/18/2024) 90 tablet 2   rosuvastatin (CRESTOR) 20 MG tablet Take 1 tablet (20 mg total) by mouth daily. (Patient not taking: Reported on 03/18/2024) 90 tablet 2   No facility-administered medications prior to visit.         Objective:   Physical Exam  Vitals:   03/18/24 0856  BP: 139/83  Pulse: 77  SpO2: 98%  Weight: 249 lb 3.2 oz (113 kg)  Height: 5\' 4"  (1.626 m)   Gen: Pleasant, overweight woman, in no distress,  normal affect  ENT: No lesions,  mouth clear,  oropharynx clear, no postnasal drip  Neck: No JVD, no stridor  Lungs: No use of accessory muscles, no crackles or wheezing on normal respiration, no wheeze on forced expiration  Cardiovascular: RRR, heart sounds normal, no murmur or gallops, no peripheral edema  Musculoskeletal: No deformities, no cyanosis or clubbing  Neuro: alert, awake, non focal  Skin: Warm, no lesions or rash    Assessment & Plan:  Hilar mass Unclear etiology but question inflammatory.  No other lab work or evaluation to suggest lymphoma.  We have been following with serial imaging  Right hilar mass Mass decreased in size with  low-level hypermetabolism. Differential includes benign lymphadenopathy versus lymphoma, with lymphoma less likely. Bronchoscopy deferred due to size reduction and lack of symptoms. - Order CT chest with contrast in May 2025. - Review CT results to determine need for further investigation or biopsy. - Schedule follow-up appointment post-CT scan in May 2025.  Time spent 33 minutes  Levy Pupa, MD, PhD 03/18/2024, 9:33 AM [facility] [phone] or if no answer before 7:00PM call [phone] For any issues after 7:00PM please call eLink [phone]

## 2024-03-20 ENCOUNTER — Encounter: Payer: Self-pay | Admitting: Cardiology

## 2024-03-20 DIAGNOSIS — I480 Paroxysmal atrial fibrillation: Secondary | ICD-10-CM | POA: Diagnosis not present

## 2024-03-20 DIAGNOSIS — R002 Palpitations: Secondary | ICD-10-CM

## 2024-04-14 ENCOUNTER — Ambulatory Visit: Admitting: Cardiology

## 2024-04-19 ENCOUNTER — Encounter: Payer: Self-pay | Admitting: Cardiology

## 2024-04-23 ENCOUNTER — Ambulatory Visit (HOSPITAL_COMMUNITY)
Admission: RE | Admit: 2024-04-23 | Discharge: 2024-04-23 | Disposition: A | Discharge status: Home / Self Care | Source: Ambulatory Visit | Attending: Emergency Medicine | Admitting: Emergency Medicine

## 2024-04-23 DIAGNOSIS — R590 Localized enlarged lymph nodes: Secondary | ICD-10-CM | POA: Diagnosis present

## 2024-04-23 MED ORDER — IOHEXOL 300 MG/ML  SOLN
75.0000 mL | Freq: Once | INTRAMUSCULAR | Status: AC | PRN
Start: 2024-04-23 — End: 2024-04-23
  Administered 2024-04-23: 75 mL via INTRAVENOUS

## 2024-04-29 ENCOUNTER — Encounter: Payer: Self-pay | Admitting: Cardiology

## 2024-06-24 ENCOUNTER — Encounter: Payer: Self-pay | Admitting: Cardiology

## 2024-07-15 ENCOUNTER — Encounter: Payer: Self-pay | Admitting: Emergency Medicine

## 2024-07-15 ENCOUNTER — Ambulatory Visit: Admitting: Emergency Medicine

## 2024-07-15 VITALS — BP 149/76 | HR 85 | Temp 97.8°F | Ht 63.0 in | Wt 248.0 lb

## 2024-07-15 DIAGNOSIS — R590 Localized enlarged lymph nodes: Secondary | ICD-10-CM | POA: Diagnosis not present

## 2024-07-15 NOTE — Patient Instructions (Signed)
 We reviewed your CT scan of the chest from May.  This was stable compared with your prior scan and improved compared with your original scan.  Good news. We talked about possibly repeating your CT chest in 1 year versus holding off.  We will hold off for now. Please contact Dr. Iliany Losier for any changes in your respiratory status.  If there is a clinical change then we may decide to repeat your CT chest at that time.

## 2024-07-15 NOTE — Progress Notes (Signed)
 Subjective:    Patient ID: Abigail Ryan, female    DOB: 04-Jun-1946, 78 y.o.   MRN: [id number]  HPI  ROV 07/15/2024 --78 year old woman who has been followed in our office for a right hilar mass noted on CT scan of the chest.  This was imaged originally 08/06/2023 and then was smaller on a subsequent PET scan 08/22/2023.  Question benign lymphadenopathy versus lymphoma (less likely).  We had planned to repeat her CT scan of the chest to follow for interval change and then determine whether bronchoscopy was warranted.  She had her CT in May 2025 as below.  She is breathing well. No cough.   CT scan of the chest 04/23/2024 reviewed by me shows lower right paratracheal node stable at 1.2 cm, no new progressive mediastinal adenopathy.  Stable 1.5 cm right hilar node.  No left adenopathy.  Tiny calcified granulomata bilaterally without any suspicious nodules.  Calcified granulomas in the liver.     Review of Systems As per HPI  Past Medical History:  Diagnosis Date   Renal stones    Thyroid  disease      Family History  Problem Relation Age of Onset   Breast cancer Daughter    Heart disease Mother 56   Heart disease Father 68     Social History   Socioeconomic History   Marital status: Married    Spouse name: Not on file   Number of children: Not on file   Years of education: Not on file   Highest education level: Not on file  Occupational History   Occupation: Retired  Tobacco Use   Smoking status: Never   Smokeless tobacco: Never  Vaping Use   Vaping status: Never Used  Substance and Sexual Activity   Alcohol  use: No   Drug use: No   Sexual activity: Not on file  Other Topics Concern   Not on file  Social History Narrative   Lives in [place] w/ husband, who has Alzheimer's   Social Drivers of Corporate investment banker Strain: Not on file  Food Insecurity: Not on file  Transportation Needs: Not on file  Physical Activity: Not on file  Stress: Not on file  Social  Connections: Not on file  Intimate Partner Violence: Not on file     Allergies  Allergen Reactions   Codeine Nausea Only and Other (See Comments)    Extreme headaches also   Morphine And Codeine Other (See Comments)    Extreme headaches     Outpatient Medications Prior to Visit  Medication Sig Dispense Refill   colchicine 0.6 MG tablet Take by mouth.     levothyroxine  (SYNTHROID ) 150 MCG tablet Take 1 tablet (150 mcg total) by mouth daily before breakfast. 90 tablet 3   nitroGLYCERIN  (NITROSTAT ) 0.4 MG SL tablet Place 1 tablet (0.4 mg total) under the tongue every 5 (five) minutes as needed for chest pain. 25 tablet 5   amLODipine  (NORVASC ) 5 MG tablet Take 1 tablet (5 mg total) by mouth daily. (Patient not taking: Reported on 07/15/2024) 90 tablet 2   aspirin  EC 81 MG tablet Take 1 tablet (81 mg total) by mouth daily. Swallow whole. (Patient not taking: Reported on 07/15/2024) 90 tablet 2   benzonatate  (TESSALON ) 100 MG capsule Take 100 mg by mouth 3 (three) times daily as needed for cough. (Patient not taking: Reported on 07/15/2024)     blood glucose meter kit and supplies KIT Dispense based on patient and insurance preference.  Use up to four times daily as directed. (FOR ICD-9 250.00, 250.01). (Patient not taking: Reported on 07/15/2024) 1 each 0   metoprolol  succinate (TOPROL  XL) 25 MG 24 hr tablet Take 1 tablet (25 mg total) by mouth daily. (Patient not taking: Reported on 07/15/2024) 90 tablet 2   rosuvastatin  (CRESTOR ) 20 MG tablet Take 1 tablet (20 mg total) by mouth daily. (Patient not taking: Reported on 07/15/2024) 90 tablet 2   No facility-administered medications prior to visit.         Objective:   Physical Exam  Vitals:   07/15/24 1509  BP: (!) 149/76  Pulse: 85  Temp: 97.8 F (36.6 C)  TempSrc: Temporal  SpO2: 96%  Weight: 248 lb (112.5 kg)  Height: 5' 3 (1.6 m)    Gen: Pleasant, overweight woman, in no distress,  normal affect  ENT: No lesions,  mouth clear,   oropharynx clear, no postnasal drip  Neck: No JVD, no stridor  Lungs: No use of accessory muscles, no crackles or wheezing on normal respiration, no wheeze on forced expiration  Cardiovascular: RRR, heart sounds normal, no murmur or gallops, no peripheral edema  Musculoskeletal: No deformities, no cyanosis or clubbing  Neuro: alert, awake, non focal  Skin: Warm, no lesions or rash    Assessment & Plan:  Hilar adenopathy Etiology unclear but reassuring follow-up imaging.  The nodes are still present but smaller.  We talked about possibly repeating her CT scan with contrast in 1 year.  She would like to hold off unless she develops symptoms.  If she develops respiratory symptoms or constitutional symptoms then I would recommend repeating.  For now we will defer.   Lamar Chris, MD, PhD 07/15/2024, 3:44 PM [facility] [phone] or if no answer before 7:00PM call [phone] For any issues after 7:00PM please call eLink [phone]

## 2024-07-15 NOTE — Assessment & Plan Note (Signed)
 Etiology unclear but reassuring follow-up imaging.  The nodes are still present but smaller.  We talked about possibly repeating her CT scan with contrast in 1 year.  She would like to hold off unless she develops symptoms.  If she develops respiratory symptoms or constitutional symptoms then I would recommend repeating.  For now we will defer.

## 2024-11-04 ENCOUNTER — Emergency Department (HOSPITAL_COMMUNITY)

## 2024-11-04 ENCOUNTER — Encounter (HOSPITAL_COMMUNITY): Payer: Self-pay | Admitting: Internal Medicine

## 2024-11-04 ENCOUNTER — Inpatient Hospital Stay (HOSPITAL_COMMUNITY)

## 2024-11-04 ENCOUNTER — Other Ambulatory Visit: Payer: Self-pay

## 2024-11-04 ENCOUNTER — Inpatient Hospital Stay (HOSPITAL_COMMUNITY)
Admission: EM | Admit: 2024-11-04 | Discharge: 2024-11-09 | DRG: 062 | Disposition: A | Discharge status: Rehabilitation Facility | Attending: Neurology | Admitting: Neurology

## 2024-11-04 DIAGNOSIS — E039 Hypothyroidism, unspecified: Secondary | ICD-10-CM | POA: Diagnosis present

## 2024-11-04 DIAGNOSIS — G8194 Hemiplegia, unspecified affecting left nondominant side: Secondary | ICD-10-CM | POA: Diagnosis present

## 2024-11-04 DIAGNOSIS — Z7982 Long term (current) use of aspirin: Secondary | ICD-10-CM

## 2024-11-04 DIAGNOSIS — Z885 Allergy status to narcotic agent status: Secondary | ICD-10-CM

## 2024-11-04 DIAGNOSIS — I10 Essential (primary) hypertension: Secondary | ICD-10-CM | POA: Diagnosis not present

## 2024-11-04 DIAGNOSIS — I6389 Other cerebral infarction: Secondary | ICD-10-CM

## 2024-11-04 DIAGNOSIS — I110 Hypertensive heart disease with heart failure: Secondary | ICD-10-CM | POA: Diagnosis present

## 2024-11-04 DIAGNOSIS — E119 Type 2 diabetes mellitus without complications: Secondary | ICD-10-CM | POA: Diagnosis present

## 2024-11-04 DIAGNOSIS — I639 Cerebral infarction, unspecified: Principal | ICD-10-CM | POA: Diagnosis present

## 2024-11-04 DIAGNOSIS — R29704 NIHSS score 4: Secondary | ICD-10-CM | POA: Diagnosis present

## 2024-11-04 DIAGNOSIS — I5032 Chronic diastolic (congestive) heart failure: Secondary | ICD-10-CM | POA: Diagnosis present

## 2024-11-04 DIAGNOSIS — E1151 Type 2 diabetes mellitus with diabetic peripheral angiopathy without gangrene: Secondary | ICD-10-CM

## 2024-11-04 DIAGNOSIS — I6381 Other cerebral infarction due to occlusion or stenosis of small artery: Secondary | ICD-10-CM | POA: Diagnosis not present

## 2024-11-04 DIAGNOSIS — Z79890 Hormone replacement therapy (postmenopausal): Secondary | ICD-10-CM

## 2024-11-04 DIAGNOSIS — W19XXXA Unspecified fall, initial encounter: Secondary | ICD-10-CM | POA: Diagnosis present

## 2024-11-04 DIAGNOSIS — I480 Paroxysmal atrial fibrillation: Secondary | ICD-10-CM | POA: Diagnosis present

## 2024-11-04 DIAGNOSIS — M25512 Pain in left shoulder: Secondary | ICD-10-CM | POA: Diagnosis present

## 2024-11-04 DIAGNOSIS — Z8249 Family history of ischemic heart disease and other diseases of the circulatory system: Secondary | ICD-10-CM

## 2024-11-04 DIAGNOSIS — I4891 Unspecified atrial fibrillation: Secondary | ICD-10-CM | POA: Diagnosis present

## 2024-11-04 DIAGNOSIS — E782 Mixed hyperlipidemia: Secondary | ICD-10-CM | POA: Diagnosis present

## 2024-11-04 DIAGNOSIS — Z6841 Body Mass Index (BMI) 40.0 and over, adult: Secondary | ICD-10-CM

## 2024-11-04 DIAGNOSIS — Z7901 Long term (current) use of anticoagulants: Secondary | ICD-10-CM

## 2024-11-04 DIAGNOSIS — E66813 Obesity, class 3: Secondary | ICD-10-CM | POA: Diagnosis present

## 2024-11-04 DIAGNOSIS — Z79899 Other long term (current) drug therapy: Secondary | ICD-10-CM

## 2024-11-04 DIAGNOSIS — Z803 Family history of malignant neoplasm of breast: Secondary | ICD-10-CM

## 2024-11-04 DIAGNOSIS — R29810 Facial weakness: Secondary | ICD-10-CM | POA: Diagnosis present

## 2024-11-04 DIAGNOSIS — E669 Obesity, unspecified: Secondary | ICD-10-CM

## 2024-11-04 LAB — DIFFERENTIAL
Abs Immature Granulocytes: 0.05 K/uL (ref 0.00–0.07)
Basophils Absolute: 0 K/uL (ref 0.0–0.1)
Basophils Relative: 0 %
Eosinophils Absolute: 0.1 K/uL (ref 0.0–0.5)
Eosinophils Relative: 1 %
Immature Granulocytes: 1 %
Lymphocytes Relative: 10 %
Lymphs Abs: 1 K/uL (ref 0.7–4.0)
Monocytes Absolute: 0.8 K/uL (ref 0.1–1.0)
Monocytes Relative: 7 %
Neutro Abs: 8.3 K/uL — ABNORMAL HIGH (ref 1.7–7.7)
Neutrophils Relative %: 81 %

## 2024-11-04 LAB — COMPREHENSIVE METABOLIC PANEL WITH GFR
ALT: 18 U/L (ref 0–44)
AST: 23 U/L (ref 15–41)
Albumin: 3.4 g/dL — ABNORMAL LOW (ref 3.5–5.0)
Alkaline Phosphatase: 76 U/L (ref 38–126)
Anion gap: 11 (ref 5–15)
BUN: 18 mg/dL (ref 8–23)
CO2: 23 mmol/L (ref 22–32)
Calcium: 8.7 mg/dL — ABNORMAL LOW (ref 8.9–10.3)
Chloride: 104 mmol/L (ref 98–111)
Creatinine, Ser: 0.85 mg/dL (ref 0.44–1.00)
GFR, Estimated: >60 mL/min (ref >60)
Glucose, Bld: 122 mg/dL — ABNORMAL HIGH (ref 70–99)
Potassium: 4.2 mmol/L (ref 3.5–5.1)
Sodium: 138 mmol/L (ref 135–145)
Total Bilirubin: 0.5 mg/dL (ref 0.0–1.2)
Total Protein: 6.4 g/dL — ABNORMAL LOW (ref 6.5–8.1)

## 2024-11-04 LAB — I-STAT CHEM 8, ED
BUN: 23 mg/dL (ref 8–23)
Calcium, Ion: 1.1 mmol/L — ABNORMAL LOW (ref 1.15–1.40)
Chloride: 107 mmol/L (ref 98–111)
Creatinine, Ser: 0.9 mg/dL (ref 0.44–1.00)
Glucose, Bld: 138 mg/dL — ABNORMAL HIGH (ref 70–99)
HCT: 38 % (ref 36.0–46.0)
Hemoglobin: 12.9 g/dL (ref 12.0–15.0)
Potassium: 3.9 mmol/L (ref 3.5–5.1)
Sodium: 142 mmol/L (ref 135–145)
TCO2: 24 mmol/L (ref 22–32)

## 2024-11-04 LAB — CBC
HCT: 40 % (ref 36.0–46.0)
Hemoglobin: 13.1 g/dL (ref 12.0–15.0)
MCH: 29.3 pg (ref 26.0–34.0)
MCHC: 32.8 g/dL (ref 30.0–36.0)
MCV: 89.5 fL (ref 80.0–100.0)
Platelets: 213 K/uL (ref 150–400)
RBC: 4.47 MIL/uL (ref 3.87–5.11)
RDW: 13.7 % (ref 11.5–15.5)
WBC: 10.3 K/uL (ref 4.0–10.5)
nRBC: 0 % (ref 0.0–0.2)

## 2024-11-04 LAB — ETHANOL: Alcohol, Ethyl (B): <15 mg/dL (ref <15)

## 2024-11-04 LAB — ECHOCARDIOGRAM COMPLETE
AR max vel: 2.43 cm2
AV Area VTI: 2.48 cm2
AV Area mean vel: 2.28 cm2
AV Mean grad: 6 mmHg
AV Peak grad: 12.5 mmHg
Ao pk vel: 1.77 m/s
Area-P 1/2: 2.69 cm2
Height: 63 in
S' Lateral: 2.5 cm
Weight: 4042.35 [oz_av]

## 2024-11-04 LAB — APTT: aPTT: 23 s — ABNORMAL LOW (ref 24–36)

## 2024-11-04 LAB — PROTIME-INR
INR: 1.1 (ref 0.8–1.2)
Prothrombin Time: 14.5 s (ref 11.4–15.2)

## 2024-11-04 LAB — CBG MONITORING, ED: Glucose-Capillary: 141 mg/dL — ABNORMAL HIGH (ref 70–99)

## 2024-11-04 MED ORDER — LABETALOL HCL 5 MG/ML IV SOLN: 10.0000 mg | INTRAVENOUS | Status: DC | PRN

## 2024-11-04 MED ORDER — PANTOPRAZOLE SODIUM 40 MG IV SOLR
40.0000 mg | Freq: Every day | INTRAVENOUS | Status: DC
  Administered 2024-11-04 – 2024-11-05 (×2): 40 mg via INTRAVENOUS
  Filled 2024-11-04 (×3): qty 10

## 2024-11-04 MED ORDER — PERFLUTREN LIPID MICROSPHERE
1.0000 mL | INTRAVENOUS | Status: AC | PRN
  Administered 2024-11-04: 3 mL via INTRAVENOUS

## 2024-11-04 MED ORDER — GADOBUTROL 1 MMOL/ML IV SOLN
10.0000 mL | Freq: Once | INTRAVENOUS | Status: AC | PRN
  Administered 2024-11-04: 10 mL via INTRAVENOUS

## 2024-11-04 MED ORDER — SODIUM CHLORIDE 0.9% FLUSH
3.0000 mL | Freq: Once | INTRAVENOUS | Status: AC
  Administered 2024-11-04: 3 mL via INTRAVENOUS

## 2024-11-04 MED ORDER — CHLORHEXIDINE GLUCONATE CLOTH 2 % EX PADS
6.0000 | MEDICATED_PAD | Freq: Every day | CUTANEOUS | Status: DC
  Administered 2024-11-04 – 2024-11-08 (×5): 6 via TOPICAL

## 2024-11-04 MED ORDER — SODIUM CHLORIDE 0.9 % IV SOLN: INTRAVENOUS | Status: AC

## 2024-11-04 MED ORDER — SENNOSIDES-DOCUSATE SODIUM 8.6-50 MG PO TABS
1.0000 | ORAL_TABLET | Freq: Every evening | ORAL | Status: DC | PRN
  Administered 2024-11-07: 1 via ORAL
  Filled 2024-11-04: qty 1

## 2024-11-04 MED ORDER — ACETAMINOPHEN 650 MG RE SUPP: 650.0000 mg | RECTAL | Status: DC | PRN

## 2024-11-04 MED ORDER — ACETAMINOPHEN 160 MG/5ML PO SOLN: 650.0000 mg | ORAL | Status: DC | PRN

## 2024-11-04 MED ORDER — CLEVIDIPINE BUTYRATE 0.5 MG/ML IV EMUL: 0.0000 mg/h | INTRAVENOUS | Status: DC

## 2024-11-04 MED ORDER — TENECTEPLASE 25 MG IV KIT
25.0000 mg | PACK | Freq: Once | INTRAVENOUS | Status: AC
  Administered 2024-11-04: 25 mg via INTRAVENOUS
  Filled 2024-11-04: qty 5

## 2024-11-04 MED ORDER — STROKE: EARLY STAGES OF RECOVERY BOOK
Freq: Once | Status: AC
  Administered 2024-11-05: 1
  Filled 2024-11-04: qty 1

## 2024-11-04 MED ORDER — SODIUM CHLORIDE 0.9% FLUSH: 10.0000 mL | INTRAVENOUS | Status: DC | PRN

## 2024-11-04 MED ORDER — ACETAMINOPHEN 325 MG PO TABS
650.0000 mg | ORAL_TABLET | ORAL | Status: DC | PRN
  Filled 2024-11-04: qty 2

## 2024-11-04 NOTE — Progress Notes (Signed)
 PT Cancellation Note  Patient Details Name: Abigail Ryan MRN: [id number] DOB: 1946/12/08   Cancelled Treatment:    Reason Eval/Treat Not Completed: Active bedrest order   Lenoard KATHEE Byanka Landrus 11/04/2024, 10:55 AM Lenoard SQUIBB, PT Acute Rehabilitation Services Office: [phone]

## 2024-11-04 NOTE — ED Provider Notes (Signed)
 [place] EMERGENCY DEPARTMENT AT [facility] Provider Note   CSN: [id number] Arrival date & time: 11/04/24  9189     Patient presents with: Code Stroke   Abigail Ryan is a 78 y.o. female.   HPI Patient presented as code stroke.  Met by Dr. Gorman and myself upon arrival.  At around 630 this morning felt an acute onset of dizziness and difficulty moving her left side.  Had been up this morning and was normal at that time.  Left-sided weakness that has improved somewhat since initial evaluation by EMS.   Past Medical History:  Diagnosis Date   Renal stones    Thyroid  disease     Prior to Admission medications   Medication Sig Start Date End Date Taking? Authorizing Provider  amLODipine  (NORVASC ) 5 MG tablet Take 1 tablet (5 mg total) by mouth daily. Patient not taking: Reported on 07/15/2024 02/20/24   Elmira Newman PARAS, MD  aspirin  EC 81 MG tablet Take 1 tablet (81 mg total) by mouth daily. Swallow whole. Patient not taking: Reported on 07/15/2024 02/20/24   Elmira Newman PARAS, MD  benzonatate  (TESSALON ) 100 MG capsule Take 100 mg by mouth 3 (three) times daily as needed for cough. Patient not taking: Reported on 07/15/2024    [provider]  blood glucose meter kit and supplies KIT Dispense based on patient and insurance preference. Use up to four times daily as directed. (FOR ICD-9 250.00, 250.01). Patient not taking: Reported on 07/15/2024 02/19/19   Perri DELENA Meliton Mickey., MD  colchicine 0.6 MG tablet Take by mouth. 12/08/23   [provider]  levothyroxine  (SYNTHROID ) 150 MCG tablet Take 1 tablet (150 mcg total) by mouth daily before breakfast. 01/14/24 07/15/24  Trixie File, MD  metoprolol  succinate (TOPROL  XL) 25 MG 24 hr tablet Take 1 tablet (25 mg total) by mouth daily. Patient not taking: Reported on 07/15/2024 02/20/24   Elmira Newman PARAS, MD  nitroGLYCERIN  (NITROSTAT ) 0.4 MG SL tablet Place 1 tablet (0.4 mg total) under the tongue every 5  (five) minutes as needed for chest pain. 02/20/24   Patwardhan, Newman PARAS, MD  rosuvastatin  (CRESTOR ) 20 MG tablet Take 1 tablet (20 mg total) by mouth daily. Patient not taking: Reported on 07/15/2024 02/20/24   Elmira Newman PARAS, MD    Allergies: Codeine and Morphine and codeine    Review of Systems  Updated Vital Signs Ht 5' 3 (1.6 m)   Wt 114.6 kg   SpO2 96%   BMI 44.75 kg/m   Physical Exam Vitals and nursing note reviewed.  HENT:     Head: Atraumatic.  Cardiovascular:     Rate and Rhythm: Normal rate.  Abdominal:     Tenderness: There is no abdominal tenderness.  Neurological:     Mental Status: She is alert.     Comments: Face overall appears symmetric.  Eye movements intact.  Visual fields grossly tact by confrontation.  Decreased strength on left arm compared to right but does have some elevation.  Also some slight elevation of left leg but not as strong as right.  Complete NIH scoring done by neurology.     (all labs ordered are listed, but only abnormal results are displayed) Labs Reviewed  CBG MONITORING, ED - Abnormal; Notable for the following components:      Result Value   Glucose-Capillary 141 (*)    All other components within normal limits  I-STAT CHEM 8, ED - Abnormal; Notable for the following components:  Glucose, Bld 138 (*)    Calcium , Ion 1.10 (*)    All other components within normal limits  PROTIME-INR  APTT  CBC  DIFFERENTIAL  COMPREHENSIVE METABOLIC PANEL WITH GFR  ETHANOL  CBG MONITORING, ED    EKG: None  Radiology: CT HEAD CODE STROKE WO CONTRAST Result Date: 11/04/2024 EXAM: CT HEAD WITHOUT 11/04/2024 08:21:56 AM TECHNIQUE: CT of the head was performed without the administration of intravenous contrast. Automated exposure control, iterative reconstruction, and/or weight based adjustment of the mA/kV was utilized to reduce the radiation dose to as low as reasonably achievable. COMPARISON: CT of the head dated 02/18/2019. CLINICAL  HISTORY: Neuro deficit, acute, stroke suspected. FINDINGS: BRAIN AND VENTRICLES: No acute intracranial hemorrhage. No mass effect or midline shift. No extra-axial fluid collection. No evidence of acute infarct. No hydrocephalus. ORBITS: No acute abnormality. SINUSES AND MASTOIDS: The patient is status post right antrostomy. There is mucosal disease within the right maxillary sinus. SOFT TISSUES AND SKULL: No acute skull fracture. No acute soft tissue abnormality. [place] Stroke Program Early CT Score (ASPECTS) ----- Ganglionic (Caudate, IC, Lentiform Nucleus, Insula, M1-M3): 7 Supraganglionic (M4-M6): 3 Total: 10 IMPRESSION: 1. No acute intracranial abnormality. 2. ASPECTS: 10. 3. The above findings were communicated to Dr. Nichola at 8:27 AM 11/04/24. Electronically signed by: Evalene Coho MD 11/04/2024 08:29 AM EST RP Workstation: [id number]     Procedures   Medications Ordered in the ED  sodium chloride  flush (NS) 0.9 % injection 3 mL (has no administration in time range)  tenecteplase  (TNKASE ) injection for stroke 25 mg (has no administration in time range)   stroke: early stages of recovery book (has no administration in time range)  0.9 %  sodium chloride  infusion (has no administration in time range)  acetaminophen  (TYLENOL ) tablet 650 mg (has no administration in time range)    Or  acetaminophen  (TYLENOL ) 160 MG/5ML solution 650 mg (has no administration in time range)    Or  acetaminophen  (TYLENOL ) suppository 650 mg (has no administration in time range)  senna-docusate (Senokot-S) tablet 1 tablet (has no administration in time range)  pantoprazole  (PROTONIX ) injection 40 mg (has no administration in time range)                                    Medical Decision Making Amount and/or Complexity of Data Reviewed Labs: ordered. Radiology: ordered.  Risk Decision regarding hospitalization.   Patient presented as a code stroke.  Does have some left-sided deficits.  Somewhat  debilitating and I think unlikely to be able to ambulate with it.  Differential diagnosis to include intracranial hemorrhage and stroke mimics.  Initial head CT independent interpreted and showed no some blood.  With neurology discussion decision made to give TNK.  Patient was difficult IV access.  Was able to get peripheral IV in the hand but not big enough to do a CTA.  After numerous attempts both by myself and nursing including ultrasound guidance decision made to go for MRI MRA to make decision on neuro IR.  Will be admitted.  CRITICAL CARE Performed by: [place] Total critical care time: 30 minutes Critical care time was exclusive of separately billable procedures and treating other patients. Critical care was necessary to treat or prevent imminent or life-threatening deterioration. Critical care was time spent personally by me on the following activities: development of treatment plan with patient and/or surrogate as well as nursing, discussions  with consultants, evaluation of patient's response to treatment, examination of patient, obtaining history from patient or surrogate, ordering and performing treatments and interventions, ordering and review of laboratory studies, ordering and review of radiographic studies, pulse oximetry and re-evaluation of patient's condition.        Final diagnoses:  Cerebrovascular accident (CVA), unspecified mechanism [facility])    ED Discharge Orders     None          Patsey Lot, MD 11/04/24 [phone]

## 2024-11-04 NOTE — Code Documentation (Signed)
 Stroke Response Nurse Documentation Code Documentation  Abigail Ryan is a 78 y.o. female arriving to [facility]  via [facility] on 11/04/2024 with past medical hx of HTN, CVA. On No antithrombotic. Code stroke was activated by EMS.   Patient from home where she was LKW at [phone] and now complaining of left sided weakness and left facial droop. Per EMS, patient woke up at 0545 and was in her normal state of health. She was reading a magazine at her counter when she had an acute onset of dizziness and left sided weakness. On EMS arrival, they noted left sided weakness and left facial droop.   Stroke team at the bedside on patient arrival. Labs drawn and patient cleared for CT by Dr. Patsey. Patient to CT with team. NIHSS 4, see documentation for details and code stroke times. Patient with left arm weakness, left leg weakness, and left decreased sensation on exam. The following imaging was completed:  CT Head and MRI. Patient is a candidate for IV Thrombolytic per mD. Patient is not a candidate for IR due to no LVO noted on imaging per MD.   Care Plan: VS/NIHSS q20min x 2hrs, q60min x6hrs, then q1hr; BP Goal <180/105.    Process Delays Noted: Unable to get IV access  Bedside handoff with ED RN Little.    Annabella DELENA Bame  Stroke Response RN

## 2024-11-04 NOTE — H&P (Addendum)
 NEUROLOGY H&P NOTE   Date of service: November 04, 2024 Patient Name: Abigail Ryan MRN:  [id number] DOB:  17-Mar-1946 Chief Complaint: Left hemiparesis  History of Present Illness  Abigail Ryan is a 78 y.o. female with hx of hypertension, hyperlipidemia, diabetes, atrial fibrillation not on anticoagulation and Bell's palsy who presents with sudden onset left hemiparesis.  Patient was in her normal state of health earlier today and suddenly developed left-sided weakness and fell over.  It took her some time to be able to reach her phone and call EMS.  On arrival, she was noted to have drift and weakness in left arm and leg as well as sensory deficit to the left face.  Initial CT head demonstrated no acute abnormality, and TNK was administered after discussion with the patient.  Of note, patient did have an episode of rectal bleeding recorded in chart in 2020, but this was mild, occurred while patient was anticoagulated and has not recurred since.  She did have an episode of A-fib in 2020 when she was admitted for sepsis and was anticoagulated for a time afterwards, but this was discontinued several years ago in the setting of rectal bleeding.  She reports some dyspnea with exertion but no other recent symptoms.  Discussed CODE STATUS with patient, and she states that she would like to be a DNR and has advanced directives but would be okay with intubation alone.  There was a delay in obtaining vessel imaging due to difficulty establishing appropriate IV access, and patient was ultimately taken to STAT MRA.   Last known well: 0630 Modified rankin score: 1-No significant post stroke disability and can perform usual duties with stroke symptoms IV Thrombolysis: Yes, given at 0826 Thrombectomy: No, no LVO  NIHSS components Score: Comment  1a Level of Conscious 0[x]  1[]  2[]  3[]      1b LOC Questions 0[x]  1[]  2[]       1c LOC Commands 0[x]  1[]  2[]       2 Best Gaze 0[x]  1[]  2[]       3 Visual 0[x]  1[]   2[]  3[]      4 Facial Palsy 0[x]  1[]  2[]  3[]      5a Motor Arm - left 0[]  1[]  2[x]  3[]  4[]  UN[]    5b Motor Arm - Right 0[x]  1[]  2[]  3[]  4[]  UN[]    6a Motor Leg - Left 0[]  1[x]  2[]  3[]  4[]  UN[]    6b Motor Leg - Right 0[x]  1[]  2[]  3[]  4[]  UN[]    7 Limb Ataxia 0[x]  1[]  2[]  UN[]      8 Sensory 0[]  1[x]  2[]  UN[]      9 Best Language 0[x]  1[]  2[]  3[]      10 Dysarthria 0[x]  1[]  2[]  UN[]      11 Extinct. and Inattention 0[x]  1[]  2[]       TOTAL:4       ROS  Comprehensive ROS performed and pertinent positives documented in the HPI  Past History   Past Medical History:  Diagnosis Date   Renal stones    Thyroid  disease    Past Surgical History:  Procedure Laterality Date   ABDOMINAL HYSTERECTOMY     APPENDECTOMY     CHOLECYSTECTOMY     Family History  Problem Relation Age of Onset   Breast cancer Daughter    Heart disease Mother 79   Heart disease Father 63   Social History   Socioeconomic History   Marital status: Married    Spouse name: Not on file   Number of children: Not on  file   Years of education: Not on file   Highest education level: Not on file  Occupational History   Occupation: Retired  Tobacco Use   Smoking status: Never   Smokeless tobacco: Never  Vaping Use   Vaping status: Never Used  Substance and Sexual Activity   Alcohol  use: No   Drug use: No   Sexual activity: Not on file  Other Topics Concern   Not on file  Social History Narrative   Lives in [place] w/ husband, who has Alzheimer's   Social Drivers of Corporate Investment Banker Strain: Not on file  Food Insecurity: Not on file  Transportation Needs: Not on file  Physical Activity: Not on file  Stress: Not on file  Social Connections: Not on file   Allergies  Allergen Reactions   Codeine Nausea Only and Other (See Comments)    Extreme headaches also   Morphine And Codeine Other (See Comments)    Extreme headaches    Medications   Current Facility-Administered Medications:    [START  ON 11/05/2024]  stroke: early stages of recovery book, , Does not apply, Once, [employer], Cortney E, NP   0.9 %  sodium chloride  infusion, , Intravenous, Continuous, de La Torre, Cortney E, NP   acetaminophen  (TYLENOL ) tablet 650 mg, 650 mg, Oral, Q4H PRN **OR** acetaminophen  (TYLENOL ) 160 MG/5ML solution 650 mg, 650 mg, Per Tube, Q4H PRN **OR** acetaminophen  (TYLENOL ) suppository 650 mg, 650 mg, Rectal, Q4H PRN, de Clint Kill, Cortney E, NP   pantoprazole  (PROTONIX ) injection 40 mg, 40 mg, Intravenous, QHS, de La Torre, Cortney E, NP   senna-docusate (Senokot-S) tablet 1 tablet, 1 tablet, Oral, QHS PRN, de Clint Kill, Cortney E, NP   sodium chloride  flush (NS) 0.9 % injection 3 mL, 3 mL, Intravenous, Once, Patsey Lot, MD   tenecteplase  (TNKASE ) injection for stroke 25 mg, 25 mg, Intravenous, Once, Gyasi Hazzard, Zeke HERO, MD  Current Outpatient Medications:    amLODipine  (NORVASC ) 5 MG tablet, Take 1 tablet (5 mg total) by mouth daily. (Patient not taking: Reported on 07/15/2024), Disp: 90 tablet, Rfl: 2   aspirin  EC 81 MG tablet, Take 1 tablet (81 mg total) by mouth daily. Swallow whole. (Patient not taking: Reported on 07/15/2024), Disp: 90 tablet, Rfl: 2   benzonatate  (TESSALON ) 100 MG capsule, Take 100 mg by mouth 3 (three) times daily as needed for cough. (Patient not taking: Reported on 07/15/2024), Disp: , Rfl:    blood glucose meter kit and supplies KIT, Dispense based on patient and insurance preference. Use up to four times daily as directed. (FOR ICD-9 250.00, 250.01). (Patient not taking: Reported on 07/15/2024), Disp: 1 each, Rfl: 0   colchicine 0.6 MG tablet, Take by mouth., Disp: , Rfl:    levothyroxine  (SYNTHROID ) 150 MCG tablet, Take 1 tablet (150 mcg total) by mouth daily before breakfast., Disp: 90 tablet, Rfl: 3   metoprolol  succinate (TOPROL  XL) 25 MG 24 hr tablet, Take 1 tablet (25 mg total) by mouth daily. (Patient not taking: Reported on 07/15/2024), Disp: 90 tablet, Rfl: 2    nitroGLYCERIN  (NITROSTAT ) 0.4 MG SL tablet, Place 1 tablet (0.4 mg total) under the tongue every 5 (five) minutes as needed for chest pain., Disp: 25 tablet, Rfl: 5   rosuvastatin  (CRESTOR ) 20 MG tablet, Take 1 tablet (20 mg total) by mouth daily. (Patient not taking: Reported on 07/15/2024), Disp: 90 tablet, Rfl: 2   Vitals   Vitals:   11/04/24 0818  Weight:  114.6 kg     Body mass index is 44.75 kg/m.  Physical Exam   Constitutional: Appears well-developed and well-nourished.  Psych: Affect appropriate to situation.  Eyes: No scleral injection.  HENT: No OP obstruction.  Head: Normocephalic.  Cardiovascular: Normal rate and regular rhythm.  Respiratory: Effort normal, non-labored breathing.  Skin: WDI.   Neurologic Examination    NEURO:  Mental Status: AA&Ox3, able to give clear and coherent history of present illness Speech/Language: speech is without dysarthria or aphasia.  Naming, repetition, fluency, and comprehension intact.  Cranial Nerves:  II: PERRL. Visual fields full.  III, IV, VI: EOMI. Eyelids elevate symmetrically.  V: Sensation is intact to light touch and diminished on the left near the ear VII: Smile is symmetrical.  VIII: hearing intact to voice. IX, X: Phonation is normal.  XII: tongue is midline without fasciculations. Motor: 5/5 strength to right upper and lower extremities, left side weaker than right with notable drift in left arm and leg Tone: is normal and bulk is normal Sensation- Intact to light touch bilaterally. Extinction absent to light touch to DSS.  Coordination: FTN intact the right, unable to perform on the left Gait- deferred    Labs   CBC:  Recent Labs  Lab 11/04/24 0818  HGB 12.9  HCT 38.0   Basic Metabolic Panel:  Lab Results  Component Value Date   NA 142 11/04/2024   K 3.9 11/04/2024   CO2 24 02/20/2024   GLUCOSE 138 (H) 11/04/2024   BUN 23 11/04/2024   CREATININE 0.90 11/04/2024   CALCIUM  9.5 02/20/2024    GFRNONAA 46 (L) 07/26/2023   GFRAA >60 10/20/2019   Lipid Panel:  Lab Results  Component Value Date   LDLCALC 175 (H) 02/20/2024   HgbA1c:  Lab Results  Component Value Date   HGBA1C 7.0 (H) 02/19/2019   Urine Drug Screen:     Component Value Date/Time   LABOPIA POSITIVE (A) 02/18/2019 1630   COCAINSCRNUR NONE DETECTED 02/18/2019 1630   LABBENZ NONE DETECTED 02/18/2019 1630   AMPHETMU NONE DETECTED 02/18/2019 1630   THCU NONE DETECTED 02/18/2019 1630   LABBARB NONE DETECTED 02/18/2019 1630    Alcohol  Level     Component Value Date/Time   ETH <10 02/18/2019 1605   INR  Lab Results  Component Value Date   INR 1.1 02/18/2019   APTT  Lab Results  Component Value Date   APTT 20 (L) 02/18/2019     CT Head without contrast(Personally reviewed): No acute abnormality  CT angio Head and Neck with contrast(Personally reviewed): Unable to obtain as appropriate IV access could not be established  MR Angio head and neck without contrast(Personally reviewed): No acute LVO, left vertebral artery occluded, likely chronic  MRI Brain(Personally reviewed): Acute ischemic stroke in the right basal ganglia/thalamus   Assessment   Abigail Ryan is a 79 y.o. female with history of hypertension, hyperlipidemia, diabetes, Bell's palsy and A-fib not on anticoagulation who presents with sudden onset left-sided hemiparesis.  Initial head CT demonstrated no acute abnormality, and TNK was administered after discussion of risks and benefits.  Patient was taken to MRA for vessel imaging as appropriate IV access for CTA could not be established in a timely manner.  MRA revealed no LVO.  Primary Diagnosis:  Other cerebral infarction due to occlusion of stenosis of small artery.  Secondary Diagnosis: Essential (primary) hypertension, Paroxysmal atrial fibrillation, Type 2 diabetes mellitus w/o complications, and Morbid Obesity(BMI > 40)  Recommendations  -  Admit to ICU for post TNK  monitoring - Keep BP <180/105, use labetalol  and Cleviprex  if needed - TTE  - Check A1c and LDL + add statin per guidelines - antiplt/anticoag to be added 24 hours after TNK administration - VS and NIHSS q15 minutes x 2 hours, q30 minutes x 6 hours and hourly thereafter - STAT head CT for any change in neuro exam - Tele - PT/OT/SLP - Stroke education - Amb referral to neurology upon discharge   ______________________________________________________________________ Patient seen by NP wioth MD, MD to edit note as needed.  Signed, Cortney E Everitt Clint Kill, NP [facility]  ATTENDING ATTESTATION:  Patient history of Bell's palsy with mild left facial weakness at baseline.  She has a history of atrial fibrillation that comes and goes sporadically per family she denies she has atrial fibrillation.  Not on anticoagulation.  Presented with acute weakness eligible for TNK given after consent was obtained Risks, benefits and alternatives of IVT discussed with patient and/or family and they agreed. CTH personally reviewed prior to TNK administration   Difficulty getting access with 18-gauge after multiple attempts by nurses and ER physician using ultrasound machine.  Eventually abandoned and ordered stat MRA to ensure there is no thrombus.  MRA was negative.  MRI confirmed acute stroke.  Admit to ICU stroke team to follow.  Pay close attention to telemetry and consider longer-term monitoring.  Dr. Nichola evaluated pt independently, reviewed imaging, chart, labs. Discussed and formulated plan with the Resident/APP. Changes were made to the note where appropriate. Please see APP/resident note above for details.     This patient is critically ill due stat stroke s/p tPA and at significant risk of neurological worsening, death form heart failure, respiratory failure, recurrent stroke, bleeding from [facility], seizure, sepsis. This patient's care requires constant monitoring of vital signs,  hemodynamics, respiratory and cardiac monitoring, review of multiple databases, neurological assessment, discussion with family, other specialists and medical decision making of high complexity. I spent 50 minutes of neurocritical care time in the care of this patient.   Kirsten Spearing,MD

## 2024-11-04 NOTE — Progress Notes (Signed)
*  PRELIMINARY RESULTS* Echocardiogram 2D Echocardiogram has been performed with Definity .  Abigail Ryan 11/04/2024, 1:14 PM

## 2024-11-04 NOTE — Progress Notes (Signed)
 PHARMACIST CODE STROKE RESPONSE  Notified to mix TNK at 0824 by Dr. Nichola TNK preparation completed at 0825 Given at 0826  TNK dose = 25 mg IV over 5 seconds  Issues/delays encountered (if applicable): none  Dorn LITTIE Buttner 11/04/24 8:27 AM

## 2024-11-04 NOTE — ED Triage Notes (Signed)
 Patient BIB EMS from home. Patient states that she woke up around 0545 and got up and went into the kitchen to sit at the bar and read. Patient states she had a funny sensation and she fell off the stool noticing that her left side was weak. Patient LKW 0630.

## 2024-11-05 ENCOUNTER — Inpatient Hospital Stay (HOSPITAL_COMMUNITY)

## 2024-11-05 ENCOUNTER — Other Ambulatory Visit (HOSPITAL_COMMUNITY): Payer: Self-pay

## 2024-11-05 DIAGNOSIS — I110 Hypertensive heart disease with heart failure: Secondary | ICD-10-CM

## 2024-11-05 DIAGNOSIS — I6381 Other cerebral infarction due to occlusion or stenosis of small artery: Secondary | ICD-10-CM | POA: Diagnosis not present

## 2024-11-05 DIAGNOSIS — E785 Hyperlipidemia, unspecified: Secondary | ICD-10-CM

## 2024-11-05 DIAGNOSIS — I509 Heart failure, unspecified: Secondary | ICD-10-CM

## 2024-11-05 DIAGNOSIS — R29701 NIHSS score 1: Secondary | ICD-10-CM

## 2024-11-05 DIAGNOSIS — I480 Paroxysmal atrial fibrillation: Secondary | ICD-10-CM | POA: Diagnosis not present

## 2024-11-05 DIAGNOSIS — E1151 Type 2 diabetes mellitus with diabetic peripheral angiopathy without gangrene: Secondary | ICD-10-CM | POA: Diagnosis not present

## 2024-11-05 LAB — LIPID PANEL
Cholesterol: 216 mg/dL — ABNORMAL HIGH (ref 0–200)
HDL: 54 mg/dL (ref >40)
LDL Cholesterol: 151 mg/dL — ABNORMAL HIGH (ref 0–99)
Total CHOL/HDL Ratio: 4 ratio
Triglycerides: 57 mg/dL (ref <150)
VLDL: 11 mg/dL (ref 0–40)

## 2024-11-05 LAB — COMPREHENSIVE METABOLIC PANEL WITH GFR
ALT: 16 U/L (ref 0–44)
AST: 20 U/L (ref 15–41)
Albumin: 3.1 g/dL — ABNORMAL LOW (ref 3.5–5.0)
Alkaline Phosphatase: 67 U/L (ref 38–126)
Anion gap: 10 (ref 5–15)
BUN: 13 mg/dL (ref 8–23)
CO2: 25 mmol/L (ref 22–32)
Calcium: 8.5 mg/dL — ABNORMAL LOW (ref 8.9–10.3)
Chloride: 102 mmol/L (ref 98–111)
Creatinine, Ser: 0.82 mg/dL (ref 0.44–1.00)
GFR, Estimated: >60 mL/min (ref >60)
Glucose, Bld: 139 mg/dL — ABNORMAL HIGH (ref 70–99)
Potassium: 4 mmol/L (ref 3.5–5.1)
Sodium: 137 mmol/L (ref 135–145)
Total Bilirubin: 0.6 mg/dL (ref 0.0–1.2)
Total Protein: 5.9 g/dL — ABNORMAL LOW (ref 6.5–8.1)

## 2024-11-05 LAB — HEMOGLOBIN A1C
Hgb A1c MFr Bld: 6.8 % — ABNORMAL HIGH (ref 4.8–5.6)
Mean Plasma Glucose: 148 mg/dL

## 2024-11-05 LAB — CBC
HCT: 36.7 % (ref 36.0–46.0)
Hemoglobin: 12.3 g/dL (ref 12.0–15.0)
MCH: 29.5 pg (ref 26.0–34.0)
MCHC: 33.5 g/dL (ref 30.0–36.0)
MCV: 88 fL (ref 80.0–100.0)
Platelets: 198 K/uL (ref 150–400)
RBC: 4.17 MIL/uL (ref 3.87–5.11)
RDW: 13.9 % (ref 11.5–15.5)
WBC: 7.9 K/uL (ref 4.0–10.5)
nRBC: 0 % (ref 0.0–0.2)

## 2024-11-05 MED ORDER — LEVOTHYROXINE SODIUM 75 MCG PO TABS
150.0000 ug | ORAL_TABLET | Freq: Every day | ORAL | Status: DC
  Administered 2024-11-06 – 2024-11-09 (×4): 150 ug via ORAL
  Filled 2024-11-05 (×4): qty 2

## 2024-11-05 MED ORDER — MELATONIN 3 MG PO TABS
3.0000 mg | ORAL_TABLET | Freq: Every day | ORAL | Status: DC
  Administered 2024-11-05 – 2024-11-08 (×5): 3 mg via ORAL
  Filled 2024-11-05 (×5): qty 1

## 2024-11-05 MED ORDER — ROSUVASTATIN CALCIUM 20 MG PO TABS
20.0000 mg | ORAL_TABLET | Freq: Every day | ORAL | Status: DC
  Administered 2024-11-05 – 2024-11-09 (×5): 20 mg via ORAL
  Filled 2024-11-05 (×5): qty 1

## 2024-11-05 MED ORDER — APIXABAN 5 MG PO TABS
5.0000 mg | ORAL_TABLET | Freq: Two times a day (BID) | ORAL | Status: DC
  Administered 2024-11-05 – 2024-11-09 (×8): 5 mg via ORAL
  Filled 2024-11-05 (×6): qty 1
  Filled 2024-11-05: qty 2
  Filled 2024-11-05: qty 1

## 2024-11-05 MED ORDER — ONDANSETRON HCL 4 MG/2ML IJ SOLN: 4.0000 mg | Freq: Four times a day (QID) | INTRAMUSCULAR | Status: DC | PRN

## 2024-11-05 MED ORDER — ONDANSETRON HCL 4 MG/2ML IJ SOLN
INTRAMUSCULAR | Status: AC
  Filled 2024-11-05: qty 2

## 2024-11-05 NOTE — Progress Notes (Addendum)
 STROKE TEAM PROGRESS NOTE    INTERIM HISTORY/SUBJECTIVE Patient's pastor is at the bedside.  Patient is awake alert in no distress patient received IV TNK yesterday morning Patient states she has had some improvement since yesterday she is able to move her left side more than yesterday. Patient has history of A-fib when she had sepsis in 2020 and had follow-up workup with no A-fib but now with new strokes and history would consider placing patient on Eliquis .  However patient does not have medication coverage will ask social work to help with assistance on obtaining medication Will transfer patient out of the ICU.  Labs and vitals are stable  CBC    Component Value Date/Time   WBC 7.9 11/05/2024 0400   RBC 4.17 11/05/2024 0400   HGB 12.3 11/05/2024 0400   HGB 12.7 02/20/2024 1222   HCT 36.7 11/05/2024 0400   HCT 39.2 02/20/2024 1222   PLT 198 11/05/2024 0400   PLT 258 02/20/2024 1222   MCV 88.0 11/05/2024 0400   MCV 92 02/20/2024 1222   MCH 29.5 11/05/2024 0400   MCHC 33.5 11/05/2024 0400   RDW 13.9 11/05/2024 0400   RDW 13.1 02/20/2024 1222   LYMPHSABS 1.0 11/04/2024 1110   MONOABS 0.8 11/04/2024 1110   EOSABS 0.1 11/04/2024 1110   BASOSABS 0.0 11/04/2024 1110    BMET    Component Value Date/Time   NA 137 11/05/2024 0400   NA 141 02/20/2024 1221   K 4.0 11/05/2024 0400   CL 102 11/05/2024 0400   CO2 25 11/05/2024 0400   GLUCOSE 139 (H) 11/05/2024 0400   BUN 13 11/05/2024 0400   BUN 11 02/20/2024 1221   CREATININE 0.82 11/05/2024 0400   CALCIUM  8.5 (L) 11/05/2024 0400   EGFR 63 02/20/2024 1221   GFRNONAA >60 11/05/2024 0400    IMAGING past 24 hours CT HEAD WO CONTRAST ( ) Result Date: 11/05/2024 EXAM: CT HEAD WITHOUT 11/05/2024 08:18:51 AM TECHNIQUE: CT of the head was performed without the administration of intravenous contrast. Automated exposure control, iterative reconstruction, and/or weight based adjustment of the mA/kV was utilized to reduce the radiation  dose to as low as reasonably achievable. COMPARISON: 11/04/2024 CLINICAL HISTORY: Stroke, follow up FINDINGS: BRAIN AND VENTRICLES: No acute intracranial hemorrhage. No mass effect or midline shift. No extra-axial fluid collection. There is an evolving non hemorrhagic infarct present within the posterior limb of the right internal capsule and right coronal radiata. There is a prominent perivascular space or small chronic acute infarct within the left basal ganglia. No hydrocephalus. ORBITS: Bilateral lens replacement noted. SINUSES AND MASTOIDS: Polypoid mucosal thickening in right maxillary sinus. SOFT TISSUES AND SKULL: No acute skull fracture. No acute soft tissue abnormality. IMPRESSION: 1. Evolving non-hemorrhagic infarct in the posterior limb of the right internal capsule and right corona radiata. 2. Prominent perivascular space or small chronic acute infarct in the left basal ganglia. Electronically signed by: Evalene Coho MD 11/05/2024 08:46 AM EST RP Workstation: [id number]   ECHOCARDIOGRAM COMPLETE Result Date: 11/04/2024    ECHOCARDIOGRAM REPORT   Patient Name:   LEINAALA CATANESE Date of Exam: 11/04/2024 Medical Rec #:  [id number]     Height:       63.0 in Accession #:    [id number]    Weight:       252.6 lb Date of Birth:  16-May-1946     BSA:          2.136 m Patient Age:    78 years  BP:           146/74 mmHg Patient Gender: F             HR:           75 bpm. Exam Location:  Inpatient Procedure: 2D Echo, Cardiac Doppler, Color Doppler and Intracardiac            Opacification Agent (Both Spectral and Color Flow Doppler were            utilized during procedure). Indications:    Stroke l63.9  History:        Patient has prior history of Echocardiogram examinations, most                 recent 01/26/2019. Arrythmias:Atrial Fibrillation; Risk                 Factors:Hypertension, Diabetes and Dyslipidemia.  Sonographer:    Aida Pizza RCS Referring Phys: [id number] CORTNEY E DE LA TORRE IMPRESSIONS   1. Left ventricular ejection fraction, by estimation, is 70 to 75%. The left ventricle has hyperdynamic function. The left ventricle has no regional wall motion abnormalities. There is mild concentric left ventricular hypertrophy. Left ventricular diastolic parameters are consistent with Grade I diastolic dysfunction (impaired relaxation).  2. Right ventricular systolic function is normal. The right ventricular size is normal.  3. Left atrial size was mildly dilated.  4. The mitral valve is normal in structure. Trivial mitral valve regurgitation. No evidence of mitral stenosis.  5. The aortic valve is tricuspid. There is mild calcification of the aortic valve. Aortic valve regurgitation is not visualized. Aortic valve sclerosis/calcification is present, without any evidence of aortic stenosis.  6. The inferior vena cava is normal in size with greater than 50% respiratory variability, suggesting right atrial pressure of 3 mmHg. FINDINGS  Left Ventricle: Left ventricular ejection fraction, by estimation, is 70 to 75%. The left ventricle has hyperdynamic function. The left ventricle has no regional wall motion abnormalities. Definity  contrast agent was given IV to delineate the left ventricular endocardial borders. The left ventricular internal cavity size was normal in size. There is mild concentric left ventricular hypertrophy. Left ventricular diastolic parameters are consistent with Grade I diastolic dysfunction (impaired relaxation). Right Ventricle: The right ventricular size is normal. No increase in right ventricular wall thickness. Right ventricular systolic function is normal. Left Atrium: Left atrial size was mildly dilated. Right Atrium: Right atrial size was normal in size. Pericardium: There is no evidence of pericardial effusion. Mitral Valve: The mitral valve is normal in structure. Mild mitral annular calcification. Trivial mitral valve regurgitation. No evidence of mitral valve stenosis. Tricuspid  Valve: The tricuspid valve is normal in structure. Tricuspid valve regurgitation is not demonstrated. No evidence of tricuspid stenosis. Aortic Valve: The aortic valve is tricuspid. There is mild calcification of the aortic valve. Aortic valve regurgitation is not visualized. Aortic valve sclerosis/calcification is present, without any evidence of aortic stenosis. Aortic valve mean gradient measures 6.0 mmHg. Aortic valve peak gradient measures 12.5 mmHg. Aortic valve area, by VTI measures 2.48 cm. Pulmonic Valve: The pulmonic valve was normal in structure. Pulmonic valve regurgitation is not visualized. No evidence of pulmonic stenosis. Aorta: The aortic root is normal in size and structure. Venous: The inferior vena cava is normal in size with greater than 50% respiratory variability, suggesting right atrial pressure of 3 mmHg. IAS/Shunts: No atrial level shunt detected by color flow Doppler.  LEFT VENTRICLE PLAX 2D LVIDd:  3.60 cm   Diastology LVIDs:         2.50 cm   LV e' medial:    6.53 cm/s LV PW:         1.10 cm   LV E/e' medial:  14.7 LV IVS:        1.10 cm   LV e' lateral:   7.94 cm/s LVOT diam:     2.00 cm   LV E/e' lateral: 12.1 LV SV:         81 LV SV Index:   38 LVOT Area:     3.14 cm  RIGHT VENTRICLE RV S prime:     16.90 cm/s TAPSE (M-mode): 1.6 cm LEFT ATRIUM           Index        RIGHT ATRIUM           Index LA diam:      3.00 cm 1.40 cm/m   RA Area:     14.10 cm LA Vol (A2C): 42.3 ml 19.81 ml/m  RA Volume:   34.50 ml  16.16 ml/m LA Vol (A4C): 71.0 ml 33.25 ml/m  AORTIC VALVE AV Area (Vmax):    2.43 cm AV Area (Vmean):   2.28 cm AV Area (VTI):     2.48 cm AV Vmax:           177.00 cm/s AV Vmean:          111.000 cm/s AV VTI:            0.326 m AV Peak Grad:      12.5 mmHg AV Mean Grad:      6.0 mmHg LVOT Vmax:         137.00 cm/s LVOT Vmean:        80.500 cm/s LVOT VTI:          0.257 m LVOT/AV VTI ratio: 0.79  AORTA Ao Root diam: 3.50 cm MITRAL VALVE MV Area (PHT): 2.69 cm      SHUNTS MV Decel Time: 282 msec     Systemic VTI:  0.26 m MV E velocity: 96.20 cm/s   Systemic Diam: 2.00 cm MV A velocity: 112.00 cm/s MV E/A ratio:  0.86 Toribio Fuel MD Electronically signed by Toribio Fuel MD Signature Date/Time: 11/04/2024/1:22:43 PM    Final     Vitals:   11/05/24 0800 11/05/24 0848 11/05/24 0900 11/05/24 1000  BP: (!) 124/50  131/60 125/61  Pulse: 72 70 72 74  Resp: 19 17 16 20   Temp: 98.7 F (37.1 C)     TempSrc: Axillary     SpO2: 92% 95% 93% 92%  Weight:      Height:         PHYSICAL EXAM General:  Alert, well-nourished, well-developed patient in no acute distress Psych:  Mood and affect appropriate for situation CV: Regular rate and rhythm on monitor Respiratory:  Regular, unlabored respirations on room air GI: Abdomen soft and nontender   NEURO:  Mental Status: AA&Ox3, patient is able to give clear and coherent history Speech/Language: speech is without dysarthria or aphasia.  Naming, repetition, fluency, and comprehension intact.  Cranial Nerves:  II: PERRL. Visual fields full.  III, IV, VI: EOMI. Eyelids elevate symmetrically.  V: Sensation is intact to light touch and symmetrical to face.  VII: Face is symmetrical resting and smiling VIII: hearing intact to voice. IX, X: Palate elevates symmetrically. Phonation is normal.  KP:Dynloizm shrug 5/5. XII: tongue is midline without fasciculations.  Motor: 5/5 strength in right arm and right leg, left arm patient is able to lift against gravity, leg able to lift antigravity with slight drift Tone: is normal and bulk is normal Sensation- Intact to light touch bilaterally. Extinction absent to light touch to DSS.   Coordination: FTN intact bilaterally, HKS: no ataxia in BLE.No drift.  Gait- deferred  Most Recent NIH  1a Level of Conscious.:  1b LOC Questions:  1c LOC Commands:  2 Best Gaze:  3 Visual:  4 Facial Palsy:  5a Motor Arm - left: 1 5b Motor Arm - Right:  6a Motor Leg - Left:   6b Motor Leg - Right:  7 Limb Ataxia:  8 Sensory:  9 Best Language:  10 Dysarthria:  11 Extinct. and Inatten.:  TOTAL: 1   ASSESSMENT/PLAN  [name] is a 78 y.o. female with history of   hypertension, hyperlipidemia, diabetes, atrial fibrillation not on anticoagulation and Bell's palsy who presents with sudden onset left hemiparesis.  Patient was in her normal state of health earlier today and suddenly developed left-sided weakness and fell over.  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 ASPECTS 10.    MRI: Vague area of restricted diffusion within the periventricular white matter and posterior limb of the right internal capsule, compatible with acute or subacute small vessel non-hemorrhagic infarct. MRA: Mild irregular stenosis of the right M1 segment, but no flow-limiting stenosis. Right vertebral artery is dominant and the left is hypoplastic. The left v4 segment is diminutive or moderate-to-severely stenotic proximally, with a patent distal segment. Diminutive left a1 segment with diffuse irregular contour. 24-hour CT scan no bleeding, infarct in the posterior limb of the right internal capsule and right CR 2D Echo EF 70 to 75%  LDL 151 HgbA1c pending VTE prophylaxis -Eliquis  No antithrombotic prior to admission, now on Eliquis  Therapy recommendations:  CIR Disposition: Pending  Atrial fibrillation Home Meds: None Had episode during 2020 when she had sepsis. Was on Xarelto  Had rectal bleeding and given follow-up revealed no further atrial fibrillation so Xarelto  was discontinued 02/2024 followed with Dr. Petwardhan and Ziopatch 7 day no afib Now given stroke will begin anticoagulation with Eliquis  after discussed with Dr. Elmira however patient does not have medication coverage so will need to  figure out if she can afford this Continue telemetry monitoring  Hypertension Home meds: None Stable Long-term BP goal normotensive  Hyperlipidemia Home meds: None LDL 151, goal < 70 Add Crestor  20 mg Continue statin at discharge  Diabetes type II Controlled Home meds: None HgbA1c pending goal < 7.0 CBGs SSI Recommend close follow-up with PCP for better DM control  Hypothyroidism Start home Synthroid  150 mcg  Other Stroke Risk Factors  Obesity, Body mass index is 44.75 kg/m., BMI >/= 30 associated with increased stroke risk, recommend weight loss, diet and exercise as appropriate   Congestive heart failure  Other medical issues History of left Bell's palsy  Hospital day # 1  Karna Geralds DNP, ACNPC-AG  [facility]  ATTENDING NOTE: I reviewed above note and agree with the assessment and plan. Pt was seen and examined.   Pastor at bedside.  Patient lying in bed, AO x 3, neurologically still has mild left facial droop, slurred speech and left hemiparesis.  MRI showed right CR and internal capsule infarct, location consistent  with small vessel disease however patient does have A-fib not on AC.  Discussed with Dr. Elmira, will start Eliquis .  Put on Crestor  20.  PT and OT recommend CIR  For detailed assessment and plan, please refer to above as I have made changes wherever appropriate.   Ary Cummins, MD PhD Stroke Neurology 11/05/2024 6:49 PM  This patient is critically ill due to stroke status post TNK, A-fib not on AC and at significant risk of neurological worsening, death form recurrent stroke, bleeding from TNK, heart failure. This patient's care requires constant monitoring of vital signs, hemodynamics, respiratory and cardiac monitoring, review of multiple databases, neurological assessment, discussion with family, other specialists and medical decision making of high complexity. I spent 40 minutes of neurocritical care time in the care of this  patient.    To contact Stroke Continuity provider, please refer to [email or web address]. After hours, contact General Neurology

## 2024-11-05 NOTE — Evaluation (Signed)
 Occupational Therapy Evaluation Patient Details Name: Abigail Ryan MRN: [id number] DOB: [birth year] Today's Date: 11/05/2024   History of Present Illness   78 yo female presents to [facility] 11/27 with dizziness, L weakness. 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     Clinical Impressions Abigail Ryan was evaluated s/p the above admission list. She lives alone and is indep at baseline. Upon evaluation the pt was limited by L hemi weakness, impaired functional use of RUE and RLE, chronic back pain, poor balance and decreased activity tolerance. Overall she needed mod A for bed mobility, min A to stand and minA +2 for stepping with the RW. Due to the deficits listed below the pt also needs max A for LB ADLs and min A for UB ADLs with cues for use of LUE/LE. Pt will benefit from continued acute OT services and intensive inpatient follow up therapy, >3 hours/day after discharge.       If plan is discharge home, recommend the following:   A lot of help with walking and/or transfers;A lot of help with bathing/dressing/bathroom;Assistance with cooking/housework;Assist for transportation;Supervision due to cognitive status;Help with stairs or ramp for entrance     Functional Status Assessment   Patient has had a recent decline in their functional status and demonstrates the ability to make significant improvements in function in a reasonable and predictable amount of time.     Equipment Recommendations   Other (comment) (defer)     Recommendations for Other Services   Rehab consult     Precautions/Restrictions   Precautions Precautions: Fall Recall of Precautions/Restrictions: Intact Restrictions Weight Bearing Restrictions Per Provider Order: No     Mobility Bed Mobility Overal bed mobility: Needs Assistance Bed Mobility: Supine to Sit     Supine to sit: Mod  assist          Transfers Overall transfer level: Needs assistance   Transfers: Sit to/from Stand Sit to Stand: Min assist           General transfer comment: minA +2 for stepping, LUE buckles      Balance Overall balance assessment: Needs assistance Sitting-balance support: Feet supported Sitting balance-Leahy Scale: Good     Standing balance support: Bilateral upper extremity supported, During functional activity Standing balance-Leahy Scale: Poor                             ADL either performed or assessed with clinical judgement   ADL Overall ADL's : Needs assistance/impaired Eating/Feeding: Set up   Grooming: Set up;Sitting   Upper Body Bathing: Minimal assistance   Lower Body Bathing: Moderate assistance;Sit to/from stand   Upper Body Dressing : Minimal assistance   Lower Body Dressing: Maximal assistance   Toilet Transfer: Minimal assistance;+2 for physical assistance;+2 for safety/equipment;Rolling walker (2 wheels)   Toileting- Clothing Manipulation and Hygiene: Moderate assistance;Sitting/lateral lean;Sit to/from stand       Functional mobility during ADLs: Minimal assistance;+2 for physical assistance;+2 for safety/equipment General ADL Comments: limited by L hemi weakness     Vision Baseline Vision/History: 0 No visual deficits Vision Assessment?: No apparent visual deficits     Perception Perception: Not tested       Praxis Praxis: Not tested       Pertinent Vitals/Pain Pain Assessment Pain Assessment: Faces (Simultaneous filing. User may not have seen previous data.) Faces  Pain Scale: Hurts a little bit Pain Location: back, chronic Pain Descriptors / Indicators: Discomfort Pain Intervention(s): Monitored during session, Limited activity within patient's tolerance     Extremity/Trunk Assessment Upper Extremity Assessment Upper Extremity Assessment: LUE deficits/detail;Right hand dominant LUE Deficits / Details:  gross grasp is 3+/5 MMT, elbow flexion is 3/5, shoulder is 2/5 MMT. Denies sensation changes. Unable to reach to top of head. Able to hold arm out against gravity for 3 seconds prior to drift. propriception testing was [facility] however pt had a difficlut time using the arm functionally LUE Sensation: WNL;decreased proprioception LUE Coordination: decreased gross motor;decreased fine motor   Lower Extremity Assessment Lower Extremity Assessment: Defer to PT evaluation   Cervical / Trunk Assessment Cervical / Trunk Assessment: Kyphotic   Communication Communication Communication: No apparent difficulties   Cognition Arousal: Alert Behavior During Therapy: WFL for tasks assessed/performed Cognition: No apparent impairments                               Following commands: Intact       Cueing  General Comments   Cueing Techniques: Verbal cues  VS, some dizziness noted once sitting EOB. resolved quickly           Home Living Family/patient expects to be discharged to:: Private residence Living Arrangements: Alone Available Help at Discharge: Family;Friend(s);Available 24 hours/day Type of Home: House (Simultaneous filing. User may not have seen previous data.) Home Access: Stairs to enter Entrance Stairs-Number of Steps: 3 (son suppsed to build a ramp at 1 entrance) Entrance Stairs-Rails: Right Home Layout: One level     Bathroom Shower/Tub: Chief Strategy Officer: Standard     Home Equipment: Cane - single point (has access to other equipment if needed (her daughters))      Lives With: Alone    Prior Functioning/Environment Prior Level of Function : Independent/Modified Independent             Mobility Comments: indep, intermittent use of SPC outside ADLs Comments: indep, drives    OT Problem List: Decreased strength;Decreased range of motion;Decreased activity tolerance;Impaired balance (sitting and/or standing);Decreased  cognition;Decreased safety awareness;Decreased knowledge of use of DME or AE;Decreased knowledge of precautions;Impaired UE functional use   OT Treatment/Interventions: Self-care/ADL training;Therapeutic exercise;DME and/or AE instruction;Therapeutic activities;Patient/family education;Balance training;Neuromuscular education      OT Goals(Current goals can be found in the care plan section)   Acute Rehab OT Goals Patient Stated Goal: to get better OT Goal Formulation: With patient Time For Goal Achievement: 11/19/24 Potential to Achieve Goals: Good ADL Goals Pt Will Perform Grooming: with contact guard assist;standing Pt Will Perform Upper Body Dressing: with modified independence Pt Will Perform Lower Body Dressing: with contact guard assist;sit to/from stand Pt Will Transfer to Toilet: with contact guard assist;ambulating Pt/caregiver will Perform Home Exercise Program: Increased ROM;Increased strength;Right Upper extremity;With written HEP provided   OT Frequency:  Min 2X/week       AM-PAC OT 6 Clicks Daily Activity     Outcome Measure Help from another person eating meals?: A Little Help from another person taking care of personal grooming?: A Little Help from another person toileting, which includes using toliet, bedpan, or urinal?: A Lot Help from another person bathing (including washing, rinsing, drying)?: A Lot Help from another person to put on and taking off regular upper body clothing?: A Little Help from another person to put on and taking off regular lower  body clothing?: A Lot 6 Click Score: 15   End of Session Equipment Utilized During Treatment: Rolling walker (2 wheels);Gait belt Nurse Communication: Mobility status  Activity Tolerance: Patient tolerated treatment well Patient left: in chair;with call bell/phone within reach;with chair alarm set  OT Visit Diagnosis: Unsteadiness on feet (R26.81);Other abnormalities of gait and mobility (R26.89);Muscle  weakness (generalized) (M62.81);Hemiplegia and hemiparesis                Time: 8957-8941 OT Time Calculation (min): 16 min Charges:  OT General Charges $OT Visit: 1 Visit OT Evaluation $OT Eval Moderate Complexity: 1 Mod  Lucie Kendall, OTR/L Acute Rehabilitation Services Office [phone] Secure Chat Communication Preferred   Lucie JONETTA Kendall 11/05/2024, 11:39 AM

## 2024-11-05 NOTE — Evaluation (Signed)
 Physical Therapy Evaluation Patient Details Name: Abigail Ryan MRN: [id number] DOB: [birth year] Today's Date: 11/05/2024  History of Present Illness  78 yo female presents to [facility] 11/27 with dizziness, L weakness. 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 PMHx hypertension, hyperlipidemia, diabetes, Bell's palsy and A-fib not on anticoagulation  Clinical Impression   Pt presents with LLE and LUE weakness, impaired proprioception functionally, impaired balance, poor activity tolerance, impaired gait. Pt to benefit from acute PT to address deficits. Pt ambulated short room distance, x4 forward steps bilat with max sequencing cues and LLE noted to have alternating knee buckling vs hyperextension. Pt requiring min +2 assist for mobility this date. Pt is independent at baseline, states her daughter may be moving in with her. If not, can arrange for 24/7 assist at d/c from family members. Patient will benefit from intensive inpatient follow-up therapy, >3 hours/day.         If plan is discharge home, recommend the following: A little help with bathing/dressing/bathroom;A lot of help with walking and/or transfers   Can travel by private vehicle        Equipment Recommendations None recommended by PT  Recommendations for Other Services       Functional Status Assessment Patient has had a recent decline in their functional status and demonstrates the ability to make significant improvements in function in a reasonable and predictable amount of time.     Precautions / Restrictions Precautions Precautions: Fall Recall of Precautions/Restrictions: Intact Restrictions Weight Bearing Restrictions Per Provider Order: No      Mobility  Bed Mobility Overal bed mobility: Needs Assistance             General bed mobility comments: sitting EOB with OT    Transfers Overall  transfer level: Needs assistance   Transfers: Sit to/from Stand Sit to Stand: Min assist           General transfer comment: minA +2 for stepping, LUE buckles and hyperextends in alternating fashion    Ambulation/Gait Ambulation/Gait assistance: Min assist, +2 safety/equipment Gait Distance (Feet): 4 Feet Assistive device: Rolling walker (2 wheels) Gait Pattern/deviations: Step-to pattern, Decreased step length - left, Decreased stance time - left, Knee hyperextension - left, Knees buckling Gait velocity: decr   Pre-gait activities: standing marches x7 bilat in standing General Gait Details: assist to steady, block LLE to prevent buckling, max sequencing cues and PT facilitating swing phase of stepping LLE  Stairs            Wheelchair Mobility     Tilt Bed    Modified Rankin (Stroke Patients Only)       Balance Overall balance assessment: Needs assistance Sitting-balance support: Feet supported Sitting balance-Leahy Scale: Good     Standing balance support: Bilateral upper extremity supported, During functional activity Standing balance-Leahy Scale: Poor                               Pertinent Vitals/Pain Pain Assessment Pain Assessment: Faces Faces Pain Scale: Hurts a little bit Pain Location: back, chronic Pain Descriptors / Indicators: Discomfort Pain Intervention(s): Repositioned, Limited activity within patient's tolerance, Monitored during session    Home Living Family/patient expects to be discharged to:: Private residence Living Arrangements: Alone Available Help at Discharge: Family;Friend(s);Available 24 hours/day Type of Home: House Home Access: Stairs to enter Entrance  Stairs-Rails: Right Entrance Stairs-Number of Steps: 3 (son suppsed to build a ramp at 1 entrance)   Home Layout: One level Home Equipment: Cane - single point (has access to other equipment if needed (her daughters))      Prior Function Prior Level of  Function : Independent/Modified Independent             Mobility Comments: indep, intermittent use of SPC outside ADLs Comments: indep, drives     Extremity/Trunk Assessment   Upper Extremity Assessment Upper Extremity Assessment: Defer to OT evaluation    Lower Extremity Assessment Lower Extremity Assessment: LLE deficits/detail LLE Deficits / Details: 4/5 upon MMT assessment, functionally presenting weaker with L knee buckling and hyperextension. 10/10 correct proprioception of great toe, however anticipate some proprioceptive deficit contributing to buckling. heel-to-shin WFL LLE Sensation: decreased proprioception LLE Coordination: WNL    Cervical / Trunk Assessment Cervical / Trunk Assessment: Kyphotic  Communication   Communication Communication: No apparent difficulties    Cognition Arousal: Alert Behavior During Therapy: WFL for tasks assessed/performed   PT - Cognitive impairments: No apparent impairments                         Following commands: Intact       Cueing Cueing Techniques: Verbal cues     General Comments General comments (skin integrity, edema, etc.): vss    Exercises     Assessment/Plan    PT Assessment Patient needs continued PT services  PT Problem List Decreased balance;Decreased strength;Decreased activity tolerance;Decreased knowledge of precautions;Decreased mobility;Cardiopulmonary status limiting activity;Decreased safety awareness       PT Treatment Interventions DME instruction;Therapeutic activities;Stair training;Functional mobility training;Neuromuscular re-education;Balance training;Gait training;Therapeutic exercise;Patient/family education    PT Goals (Current goals can be found in the Care Plan section)  Acute Rehab PT Goals PT Goal Formulation: With patient Time For Goal Achievement: 11/19/24 Potential to Achieve Goals: Good    Frequency Min 2X/week     Co-evaluation               AM-PAC  PT 6 Clicks Mobility  Outcome Measure Help needed turning from your back to your side while in a flat bed without using bedrails?: A Little Help needed moving from lying on your back to sitting on the side of a flat bed without using bedrails?: A Little Help needed moving to and from a bed to a chair (including a wheelchair)?: A Little Help needed standing up from a chair using your arms (e.g., wheelchair or bedside chair)?: A Little Help needed to walk in hospital room?: A Lot Help needed climbing 3-5 steps with a railing? : A Lot 6 Click Score: 16    End of Session Equipment Utilized During Treatment: Gait belt Activity Tolerance: Patient tolerated treatment well;Patient limited by fatigue Patient left: in chair;with call bell/phone within reach;with chair alarm set Nurse Communication: Mobility status PT Visit Diagnosis: Other abnormalities of gait and mobility (R26.89);Muscle weakness (generalized) (M62.81)    Time: 8941-8882 PT Time Calculation (min) (ACUTE ONLY): 19 min   Charges:   PT Evaluation $PT Eval Low Complexity: 1 Low   PT General Charges $$ ACUTE PT VISIT: 1 Visit         Johana RAMAN, PT DPT Acute Rehabilitation Services Secure Chat Preferred  Office [phone]   Akshaj Besancon E Johna 11/05/2024, 4:01 PM

## 2024-11-05 NOTE — TOC CAGE-AID Note (Signed)
 Transition of Care [facility]) - CAGE-AID Screening   Patient Details  Name: Abigail Ryan MRN: [id number] Date of Birth: 06-25-1946  [facility] of [facility] CM/SW Contact:    Jamoni Broadfoot E Elizeth Weinrich, LCSW Phone Number: 11/05/2024, 9:34 AM   Clinical Narrative: No SA noted.   CAGE-AID Screening:    Have You Ever Felt You Ought to Cut Down on Your Drinking or Drug Use?: No Have People Annoyed You By Critizing Your Drinking Or Drug Use?: No Have You Felt Bad Or Guilty About Your Drinking Or Drug Use?: No      Substance Abuse Education Offered: No

## 2024-11-05 NOTE — Progress Notes (Signed)
  Inpatient Rehab Admissions Coordinator :  Per therapy recommendations, patient was screened for CIR candidacy by Ottie Glazier RN MSN.  At this time patient appears to be a potential candidate for CIR. I will place a rehab consult per protocol for full assessment. Please call me with any questions.  Ottie Glazier RN MSN Admissions Coordinator [phone]

## 2024-11-05 NOTE — Progress Notes (Signed)
 OT Cancellation Note  Patient Details Name: Abigail Ryan MRN: [id number] DOB: 10-10-1946   Cancelled Treatment:    Reason Eval/Treat Not Completed: Active bedrest order (remains on bed rest. OT evaluation to f/u when activity orders progress)  Lucie JONETTA Kendall 11/05/2024, 8:05 AM

## 2024-11-05 NOTE — Evaluation (Signed)
 Speech Language Pathology Evaluation Patient Details Name: LIANNE CARRETO MRN: [id number] DOB: Oct 08, 1946 Today's Date: 11/05/2024 Time: 8884-8872 SLP Time Calculation (min) (ACUTE ONLY): 12 min  Problem List:  Patient Active Problem List   Diagnosis Date Noted   Acute ischemic stroke (HCC) 11/04/2024   SOB (shortness of breath) 02/20/2024   Palpitations 02/20/2024   Primary hypertension 02/20/2024   Hilar adenopathy 09/09/2023   Diverticulosis 09/09/2023   Precordial pain 07/27/2023   Coronary artery calcification seen on computed tomography 07/27/2023   Aortic atherosclerosis 07/27/2023   Bradycardia 09/29/2020   Mixed hyperlipidemia 09/29/2020   Chronic heart failure with preserved ejection fraction (HCC) 09/29/2020   Paroxysmal A-fib (HCC) 09/29/2020   Educated about COVID-19 virus infection 09/29/2020   Type 2 diabetes mellitus with other specified complication (HCC) 02/19/2019   Acute CVA (cerebrovascular accident) (HCC) 02/18/2019   H/O: GI bleed 02/18/2019   Atrial fibrillation with RVR (HCC) 01/27/2019   Liver abscess 01/27/2019   Hypothyroid 01/25/2019   Sepsis (HCC) 01/25/2019   Elevated troponin 01/25/2019   Past Medical History:  Past Medical History:  Diagnosis Date   Renal stones    Thyroid  disease    Past Surgical History:  Past Surgical History:  Procedure Laterality Date   ABDOMINAL HYSTERECTOMY     APPENDECTOMY     CHOLECYSTECTOMY     HPI:  KARLISHA MATHENA is a 78 y.o. female presented with sudden onset left-sided hemiparesis.  MRI CVA R posterior limb internal capsule and corona radiata. TNK administered 11/27. PMHx hypertension, hyperlipidemia, diabetes, Bell's palsy and A-fib not on anticoagulation   Assessment / Plan / Recommendation Clinical Impression  Pt presents with normal cognitive-linguistic function.  Expressive/receptive language are normal. Speech is clear and fluent. She is an excellent historian. Demonstrated + ability to follow  three-step commands. Verbal reasoning/problem-solving, selective attention, and working memory are all WNL.  There are no needs identified that would require SLP f/u. Our service will sign off.    SLP Assessment  SLP Recommendation/Assessment: Patient does not need any further Speech Language Pathology Services                        SLP Evaluation Cognition  Overall Cognitive Status: Within Functional Limits for tasks assessed Arousal/Alertness: Awake/alert Orientation Level: Oriented X4 Attention: Selective Selective Attention: Appears intact Memory: Appears intact Awareness: Appears intact Problem Solving: Appears intact Executive Function: Reasoning Reasoning: Appears intact       Comprehension  Auditory Comprehension Overall Auditory Comprehension: Appears within functional limits for tasks assessed Visual Recognition/Discrimination Discrimination: Not tested Reading Comprehension Reading Status: Within funtional limits    Expression Expression Primary Mode of Expression: Verbal Verbal Expression Overall Verbal Expression: Appears within functional limits for tasks assessed Written Expression Dominant Hand: Right   Oral / Motor  Oral Motor/Sensory Function Overall Oral Motor/Sensory Function: Other (comment) (hx bells palsy) Motor Speech Overall Motor Speech: Appears within functional limits for tasks assessed            [name] 11/05/2024, 11:38 AM Palma L. Vona, MA CCC/SLP Clinical Specialist - Acute Care SLP Acute Rehabilitation Services Office number [phone]

## 2024-11-05 NOTE — Progress Notes (Signed)
 SLP Cancellation Note  Patient Details Name: Abigail Ryan MRN: [id number] DOB: 05-26-1946   Cancelled treatment:       Reason Eval/Treat Not Completed: Patient at procedure or test/unavailable- currently working with OT/PT. Will continue efforts.  Maynor Mwangi L. Vona, MA CCC/SLP Clinical Specialist - Acute Care SLP Acute Rehabilitation Services Office number [phone]    [name] 11/05/2024, 11:13 AM

## 2024-11-05 NOTE — Plan of Care (Signed)

## 2024-11-06 ENCOUNTER — Inpatient Hospital Stay (HOSPITAL_COMMUNITY)

## 2024-11-06 DIAGNOSIS — R29701 NIHSS score 1: Secondary | ICD-10-CM | POA: Diagnosis not present

## 2024-11-06 DIAGNOSIS — I480 Paroxysmal atrial fibrillation: Secondary | ICD-10-CM | POA: Diagnosis not present

## 2024-11-06 DIAGNOSIS — E1151 Type 2 diabetes mellitus with diabetic peripheral angiopathy without gangrene: Secondary | ICD-10-CM | POA: Diagnosis not present

## 2024-11-06 DIAGNOSIS — I6381 Other cerebral infarction due to occlusion or stenosis of small artery: Secondary | ICD-10-CM | POA: Diagnosis not present

## 2024-11-06 NOTE — PMR Pre-admission (Signed)
 PMR Admission Coordinator Pre-Admission Assessment  Patient: Abigail Ryan is an 78 y.o., female MRN: [id number] DOB: [birth year] Height: 5' 3 (160 cm) Weight: 114.6 kg  Insurance Information HMO: yes    PPO:      PCP:      IPA:      80/20:      OTHER:  PRIMARY: [employer] Medicare      Policy#: [id number]      Subscriber: patient CM Name: Rosaline      Phone#:      Fax#: [phone] Pre-Cert#: [id number] auth for CIR from [place] with [facility] for admit 12/2 with next review date 12/8.  Updates due to CM at fax listed above.     Employer:  Benefits:  Phone #: [email or web address]     Name:  Eff. Date: 12/10/23-12/08/24     Deduct: $o (does nto have deductible)      Out of Pocket Max: $7,900 [phone] met)      Life Max: NA CIR: $475/day co-pay for days 1-5, 100% coverage for days 6+      SNF: 100% coverage for days 1-20, 4203 co-pay/day for days 21-100 Outpatient: $35/visit co-pay     Co-Pay:  Home Health: 100% coverage      Co-Pay:  DME: 80% coverage     Co-Pay: 20% co-insurance Providers: in-network SECONDARY:       Policy#:      Phone#:   Artist:       Phone#:   The Data Processing Manager" for patients in Inpatient Rehabilitation Facilities with attached "Privacy Act Statement-Health Care Records" was provided and verbally reviewed with: Patient  Emergency Contact Information Contact Information     Name Relation Home Work Mobile   O'Daniel,Jamie Daughter [phone]      Other Contacts   None on File     Current Medical History  Patient Admitting Diagnosis: CVA History of Present Illness: Pt is a 78 year old female with medical hx significant for: HTN, hyperlipidemia, diabetes, A-fib, Bell's palsy.  Pt presented to [facility] d/t acute onset of dizziness and difficulty moving left side. Pt presented as code stroke. CT negative for acute abnormality. TNK administered. MRA negative for LVO. 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 Pt recommended for Eliquis , states she will take it in the acute setting but not once discharged, she prefers to take supplements.  Therapy evaluations completed and CIR recommended d/t pt's deficits in functional mobility. Complete NIHSS TOTAL: 2  Patient's medical record from [facility] has been reviewed by the rehabilitation admission coordinator and physician.  Past Medical History  Past Medical History:  Diagnosis Date   Renal stones    Thyroid  disease     Has the patient had major surgery during 100 days prior to admission? No  Family History   family history includes Breast cancer in her daughter; Heart disease (age of onset: 95) in her father; Heart disease (age of onset: 94) in her mother.  Current Medications  Current Facility-Administered Medications:    acetaminophen  (TYLENOL ) tablet 650 mg, 650 mg, Oral, Q4H PRN **OR** acetaminophen  (TYLENOL ) 160 MG/5ML solution 650 mg, 650 mg, Per Tube, Q4H PRN **OR** acetaminophen  (TYLENOL ) suppository 650 mg, 650 mg, Rectal, Q4H PRN, de Clint Kill, Cortney E, NP   apixaban  (ELIQUIS ) tablet 5 mg, 5 mg, Oral, BID, Jerri Pfeiffer, MD, 5 mg at 11/08/24 0949   Chlorhexidine  Gluconate Cloth 2 % PADS 6 each, 6 each, Topical,  Daily, Nichola Zeke HERO, MD, 6 each at 11/08/24 1000   labetalol  (NORMODYNE ) injection 10 mg, 10 mg, Intravenous, Q2H PRN, de Clint Kill, Cortney E, NP   levothyroxine  (SYNTHROID ) tablet 150 mcg, 150 mcg, Oral, Q0600, Waddell Aquas A, NP, 150 mcg at 11/08/24 9388   melatonin tablet 3 mg, 3 mg, Oral, QHS, Arora, Ashish, MD, 3 mg at 11/07/24 2145   ondansetron  (ZOFRAN ) injection 4 mg, 4 mg, Intravenous, Q6H PRN, Jerri Pfeiffer, MD   pantoprazole  (PROTONIX ) injection 40 mg, 40 mg, Intravenous, QHS, de La Torre, Hilltop E, NP, 40 mg at 11/05/24 2124   rosuvastatin  (CRESTOR ) tablet 20 mg, 20 mg, Oral, Daily, Jerri Pfeiffer, MD, 20 mg at 11/08/24 9050   senna-docusate (Senokot-S) tablet 1  tablet, 1 tablet, Oral, QHS PRN, de Clint Kill, Cortney E, NP, 1 tablet at 11/07/24 0446   sodium chloride  flush (NS) 0.9 % injection 10-40 mL, 10-40 mL, Intracatheter, PRN, Patsey Lot, MD  Patients Current Diet:  Diet Order             Diet Heart Room service appropriate? Yes with Assist; Fluid consistency: Thin  Diet effective now                   Precautions / Restrictions Precautions Precautions: Fall Restrictions Weight Bearing Restrictions Per Provider Order: No   Has the patient had 2 or more falls or a fall with injury in the past year? No  Prior Activity Level Community (5-7x/wk): drives, gets out of house frequently  Prior Functional Level Self Care: Did the patient need help bathing, dressing, using the toilet or eating? Independent  Indoor Mobility: Did the patient need assistance with walking from room to room (with or without device)? Independent  Stairs: Did the patient need assistance with internal or external stairs (with or without device)? Independent  Functional Cognition: Did the patient need help planning regular tasks such as shopping or remembering to take medications? Independent  Patient Information Are you of Hispanic, Latino/a,or Spanish origin?: A. No, not of Hispanic, Latino/a, or Spanish origin What is your race?: A. White Do you need or want an interpreter to communicate with a doctor or health care staff?: 0. No  Patient's Response To:  Health Literacy and Transportation Is the patient able to respond to health literacy and transportation needs?: Yes Health Literacy - How often do you need to have someone help you when you read instructions, pamphlets, or other written material from your doctor or pharmacy?: Never In the past 12 months, has lack of transportation kept you from medical appointments or from getting medications?: No In the past 12 months, has lack of transportation kept you from meetings, work, or from getting things  needed for daily living?: No  Home Assistive Devices / Equipment Home Equipment: [place] - single point (has access to other equipment if needed (her daughters))  Prior Device Use: Indicate devices/aids used by the patient prior to current illness, exacerbation or injury? Cane (used occasionally)  Current Functional Level Cognition  Arousal/Alertness: Awake/alert Overall Cognitive Status: Within Functional Limits for tasks assessed Orientation Level: Oriented X4 Attention: Selective Selective Attention: Appears intact Memory: Appears intact Awareness: Appears intact Problem Solving: Appears intact Executive Function: Reasoning Reasoning: Appears intact    Extremity Assessment (includes Sensation/Coordination)  Upper Extremity Assessment: Defer to OT evaluation LUE Deficits / Details: gross grasp is 3+/5 MMT, elbow flexion is 3/5, shoulder is 2/5 MMT. Denies sensation changes. Unable to reach to top of head. Able  to hold arm out against gravity for 3 seconds prior to drift. propriception testing was [facility] however pt had a difficlut time using the arm functionally LUE Sensation: WNL, decreased proprioception LUE Coordination: decreased gross motor, decreased fine motor  Lower Extremity Assessment: LLE deficits/detail LLE Deficits / Details: 4/5 upon MMT assessment, functionally presenting weaker with L knee buckling and hyperextension. 10/10 correct proprioception of great toe, however anticipate some proprioceptive deficit contributing to buckling. heel-to-shin WFL LLE Sensation: decreased proprioception LLE Coordination: WNL    ADLs  Overall ADL's : Needs assistance/impaired Eating/Feeding: Set up Grooming: Wash/dry hands, Wash/dry face, Oral care, Brushing hair, Set up, Sitting Grooming Details (indicate cue type and reason): in recliner Upper Body Bathing: Minimal assistance Lower Body Bathing: Moderate assistance, Sit to/from stand Upper Body Dressing : Minimal assistance Lower  Body Dressing: Maximal assistance Lower Body Dressing Details (indicate cue type and reason): patient unable to doff or donn socks Toilet Transfer: Moderate assistance, Rolling walker (2 wheels) Toileting- Clothing Manipulation and Hygiene: Moderate assistance, Sitting/lateral lean, Sit to/from stand Functional mobility during ADLs: Minimal assistance, +2 for physical assistance, +2 for safety/equipment General ADL Comments: LUE weakness but strength increasing    Mobility  Overal bed mobility: Needs Assistance Bed Mobility: Supine to Sit, Sit to Supine Supine to sit: Mod assist, HOB elevated, Used rails Sit to supine: Mod assist, Used rails General bed mobility comments: Pt initially advancing BLE to L side of the bed, then to the R due to line constraints. Pt demonstrates increased difficulty sitting to R side due to L shoulder pain/ decreased ROM. Assist for BLE elevation back to EOB    Transfers  Overall transfer level: Needs assistance Equipment used: Rolling walker (2 wheels) Transfers: Sit to/from Stand, Bed to chair/wheelchair/BSC Sit to Stand: Mod assist Bed to/from chair/wheelchair/BSC transfer type:: Step pivot Step pivot transfers: Min assist General transfer comment: STS from EOB and recliner with mod A for power up and pt demonstrating good carryover of hand placement from previous session. Pivot to recliner and back to EOB with assist for L knee blocking and cues for sequencing    Ambulation / Gait / Stairs / Wheelchair Mobility  Ambulation/Gait Ambulation/Gait assistance: Editor, Commissioning (Feet): 4 Feet Assistive device: Rolling walker (2 wheels) Gait Pattern/deviations: Step-to pattern, Decreased step length - left, Decreased stance time - left, Knee hyperextension - left, Decreased step length - right, Decreased stride length, Decreased weight shift to left General Gait Details: Pt demonstrates L knee instability with hyperextension, but no overt buckling noted.  Pt able to advance LLE, but requires cues for increased B step length Gait velocity: decr Pre-gait activities: standing marches x7 bilat in standing    Posture / Balance Dynamic Sitting Balance Sitting balance - Comments: EOB Balance Overall balance assessment: Needs assistance Sitting-balance support: Feet supported Sitting balance-Leahy Scale: Good Sitting balance - Comments: EOB Standing balance support: Bilateral upper extremity supported, During functional activity Standing balance-Leahy Scale: Poor Standing balance comment: reliant on BUE support when standing    Special considerations/life events  Skin Erythema/Redness: arm/left; Ecchymosis: arm/bilateral, upper, lower and External Urinary Catheter   Previous Home Environment (from acute therapy documentation) Living Arrangements: Alone  Lives With: Alone Available Help at Discharge: Family, Friend(s), Available 24 hours/day Type of Home: House Home Layout: One level Home Access: Stairs to enter Entrance Stairs-Rails: Right, Left Entrance Stairs-Number of Steps: 3 (son suppsed to build a ramp at 1 entrance) Bathroom Shower/Tub: Engineer, Manufacturing Systems: Standard (does have one  handicapped toilet) Bathroom Accessibility: Yes How Accessible: Accessible via walker Home Care Services: No  Discharge Living Setting Plans for Discharge Living Setting: Patient's home Type of Home at Discharge: House Discharge Home Layout: One level Discharge Home Access: Stairs to enter Entrance Stairs-Rails: Right, Left Entrance Stairs-Number of Steps: 3 Discharge Bathroom Shower/Tub: Tub/shower unit Discharge Bathroom Toilet: Standard (does have one handicapped toilet) Discharge Bathroom Accessibility: Yes How Accessible: Accessible via walker Does the patient have any problems obtaining your medications?: No  Social/Family/Support Systems Anticipated Caregiver: Warren (daughter) and other family Anticipated Caregiver's Contact  Information: Warren: [phone] Caregiver Availability: 24/7 Discharge Plan Discussed with Primary Caregiver: Yes Is Caregiver In Agreement with Plan?: Yes Does Caregiver/Family have Issues with Lodging/Transportation while Pt is in Rehab?: No  Goals Patient/Family Goal for Rehab: Supervision:PT/OT Expected length of stay: 10 days Pt/Family Agrees to Admission and willing to participate: Yes Program Orientation Provided & Reviewed with Pt/Caregiver Including Roles  & Responsibilities: Yes  Decrease burden of Care through IP rehab admission: NA  Possible need for SNF placement upon discharge: Not anticipated  Patient Condition: I have reviewed medical records from [facility], spoken with [facility] team, and patient and daughter. I met with patient at the bedside for inpatient rehabilitation assessment.  Patient will benefit from ongoing PT and OT, can actively participate in 3 hours of therapy a day 5 days of the week, and can make measurable gains during the admission.  Patient will also benefit from the coordinated team approach during an Inpatient Acute Rehabilitation admission.  The patient will receive intensive therapy as well as Rehabilitation physician, nursing, social worker, and care management interventions.  Due to safety, skin/wound care, disease management, medication administration, pain management, and patient education the patient requires 24 hour a day rehabilitation nursing.  The patient is currently min/mod assist with mobility and basic ADLs.  Discharge setting and therapy post discharge at home with home health is anticipated.  Patient has agreed to participate in the Acute Inpatient Rehabilitation Program and will admit today.  Preadmission Screen Completed By: Tinnie SHAUNNA Yvone Delayne, with day of admit updates by Reche Lowers, PT, DPT 11/08/2024 2:58 PM ______________________________________________________________________   Discussed status with Dr. Rishit Burkhalter on 11/09/24  at 10:26  AM  and received approval for admission today.  Admission Coordinator: Reche Lowers, PT, DPT and Lauren SHAUNNA Yvone Delayne, CCC-SLP, time 10:26 AM Pattricia 11/09/24    Assessment/Plan: Diagnosis: R Internal capsule stroke s/p TNK Does the need for close, 24 hr/day Medical supervision in concert with the patient's rehab needs make it unreasonable for this patient to be served in a less intensive setting? Yes Co-Morbidities requiring supervision/potential complications: CHF, DM- A1c 6.8; HTN, HLD; Class III morbid obesity; BMI 44.75; L shoulder pain; hx of L bell's palsy Due to bladder management, bowel management, safety, skin/wound care, disease management, medication administration, and patient education, does the patient require 24 hr/day rehab nursing? Yes Does the patient require coordinated care of a physician, rehab nurse, PT, OT,  to address physical and functional deficits in the context of the above medical diagnosis(es)? Yes Addressing deficits in the following areas: balance, endurance, locomotion, strength, transferring, bowel/bladder control, bathing, dressing, feeding, grooming, and toileting Can the patient actively participate in an intensive therapy program of at least 3 hrs of therapy 5 days a week? Yes The potential for patient to make measurable gains while on inpatient rehab is good Anticipated functional outcomes upon discharge from inpatient rehab: supervision PT, supervision OT, n/a SLP Estimated  rehab length of stay to reach the above functional goals is: 10-12 days Anticipated discharge destination: Home 10. Overall Rehab/Functional Prognosis: good   MD Signature:

## 2024-11-06 NOTE — Plan of Care (Signed)
  Problem: Education: Goal: Knowledge of disease or condition will improve Outcome: Progressing Goal: Knowledge of secondary prevention will improve (MUST DOCUMENT ALL) Outcome: Progressing Goal: Knowledge of patient specific risk factors will improve (DELETE if not current risk factor) Outcome: Progressing   Problem: Ischemic Stroke/TIA Tissue Perfusion: Goal: Complications of ischemic stroke/TIA will be minimized Outcome: Progressing   Problem: Coping: Goal: Will verbalize positive feelings about self Outcome: Progressing Goal: Will identify appropriate support needs Outcome: Progressing   Problem: Health Behavior/Discharge Planning: Goal: Ability to manage health-related needs will improve Outcome: Progressing

## 2024-11-06 NOTE — Progress Notes (Addendum)
 STROKE TEAM PROGRESS NOTE    INTERIM HISTORY/SUBJECTIVE No family at the bedside.  Patient is awake alert in no apparent distress.  She complains of left shoulder pain and states she landed on that arm when she fell.  Will obtain x-ray to evaluate for acute process Labs and vitals are stable.  Neurological exam remains stable and unchanged  CBC    Component Value Date/Time   WBC 7.9 11/05/2024 0400   RBC 4.17 11/05/2024 0400   HGB 12.3 11/05/2024 0400   HGB 12.7 02/20/2024 1222   HCT 36.7 11/05/2024 0400   HCT 39.2 02/20/2024 1222   PLT 198 11/05/2024 0400   PLT 258 02/20/2024 1222   MCV 88.0 11/05/2024 0400   MCV 92 02/20/2024 1222   MCH 29.5 11/05/2024 0400   MCHC 33.5 11/05/2024 0400   RDW 13.9 11/05/2024 0400   RDW 13.1 02/20/2024 1222   LYMPHSABS 1.0 11/04/2024 1110   MONOABS 0.8 11/04/2024 1110   EOSABS 0.1 11/04/2024 1110   BASOSABS 0.0 11/04/2024 1110    BMET    Component Value Date/Time   NA 137 11/05/2024 0400   NA 141 02/20/2024 1221   K 4.0 11/05/2024 0400   CL 102 11/05/2024 0400   CO2 25 11/05/2024 0400   GLUCOSE 139 (H) 11/05/2024 0400   BUN 13 11/05/2024 0400   BUN 11 02/20/2024 1221   CREATININE 0.82 11/05/2024 0400   CALCIUM  8.5 (L) 11/05/2024 0400   EGFR 63 02/20/2024 1221   GFRNONAA >60 11/05/2024 0400    IMAGING past 24 hours No results found.   Vitals:   11/06/24 0018 11/06/24 0407 11/06/24 0800 11/06/24 1130  BP: 136/70 (!) 129/58 (!) 157/73 (!) 143/74  Pulse:   77 73  Resp:   19 16  Temp: 98.1 F (36.7 C) 98.4 F (36.9 C) 98 F (36.7 C) 97.7 F (36.5 C)  TempSrc: Oral Oral Oral Oral  SpO2: 98% 96%  96%  Weight:      Height:         PHYSICAL EXAM General:  Alert, well-nourished, well-developed patient in no acute distress Psych:  Mood and affect appropriate for situation CV: Regular rate and rhythm on monitor Respiratory:  Regular, unlabored respirations on room air GI: Abdomen soft and nontender   NEURO:  Mental  Status: AA&Ox3, patient is able to give clear and coherent history Speech/Language: speech is without dysarthria or aphasia.  Naming, repetition, fluency, and comprehension intact.  Cranial Nerves:  II: PERRL. Visual fields full.  III, IV, VI: EOMI. Eyelids elevate symmetrically.  V: Sensation is intact to light touch and symmetrical to face.  VII: Face is symmetrical resting and smiling VIII: hearing intact to voice. IX, X: Palate elevates symmetrically. Phonation is normal.  KP:Dynloizm shrug 5/5. XII: tongue is midline without fasciculations. Motor: 5/5 strength in right arm and right leg, left arm patient is able to lift against gravity, leg able to lift antigravity with slight drift Tone: is normal and bulk is normal Sensation- Intact to light touch bilaterally. Extinction absent to light touch to DSS.   Coordination: FTN intact bilaterally, HKS: no ataxia in BLE.No drift.  Gait- deferred  Most Recent NIH  1a Level of Conscious.:  1b LOC Questions:  1c LOC Commands:  2 Best Gaze:  3 Visual:  4 Facial Palsy:  5a Motor Arm - left: 1 5b Motor Arm - Right:  6a Motor Leg - Left:  6b Motor Leg - Right:  7 Limb Ataxia:  8 Sensory:  9 Best Language:  10 Dysarthria:  11 Extinct. and Inatten.:  TOTAL: 1   ASSESSMENT/PLAN  [name] is a 78 y.o. female with history of hypertension, hyperlipidemia, diabetes, atrial fibrillation not on anticoagulation and Bell's palsy who presents with sudden onset left hemiparesis.  Patient was in her normal state of health earlier today and suddenly developed left-sided weakness and fell over.  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 ASPECTS 10.    MRI: Vague area of restricted diffusion within the periventricular white matter and posterior limb of the right  internal capsule, compatible with acute or subacute small vessel non-hemorrhagic infarct. MRA: Mild irregular stenosis of the right M1 segment, but no flow-limiting stenosis. Right vertebral artery is dominant and the left is hypoplastic. The left v4 segment is diminutive or moderate-to-severely stenotic proximally, with a patent distal segment. Diminutive left a1 segment with diffuse irregular contour. 24-hour CT scan no bleeding, infarct in the posterior limb of the right internal capsule and right CR 2D Echo EF 70 to 75%  LDL 151 HgbA1c 6.8 VTE prophylaxis -Eliquis  No antithrombotic prior to admission, now on Eliquis  Therapy recommendations:  CIR Disposition: Pending  Atrial fibrillation Home Meds: None Had episode during 2020 when she had sepsis. Was on Xarelto  Had rectal bleeding and given follow-up revealed no further atrial fibrillation so Xarelto  was discontinued 02/2024 followed with Dr. Petwardhan and Ziopatch 7 day no afib Now given stroke will begin anticoagulation with Eliquis  after discussed with Dr. Elmira however patient does not have medication coverage so will need to figure out if she can afford this Continue telemetry monitoring  Hypertension Home meds: None Stable Long-term BP goal normotensive  Hyperlipidemia Home meds: None LDL 151, goal < 70 Add Crestor  20 mg Continue statin at discharge  Diabetes type II Controlled Home meds: None HgbA1c 6.8 goal < 7.0 CBGs SSI Recommend close follow-up with PCP for better DM control  Hypothyroidism Start home Synthroid  150 mcg  Left shoulder pain s/p fall  Will obtain shoulder x-ray to evaluate for acute process   Other Stroke Risk Factors  Obesity, Body mass index is 44.75 kg/m., BMI >/= 30 associated with increased stroke risk, recommend weight loss, diet and exercise as appropriate   Congestive heart failure  Other medical issues History of left Bell's palsy  Hospital day # 2  Abigail Geralds DNP,  ACNPC-AG  [facility]  ATTENDING NOTE: I reviewed above note and agree with the assessment and plan. Pt was seen and examined.   Two family and friend is at the bedside. Pt AAO, language intact, still has mild L sided hemiparesis, arm > leg. On eliquis  and statin, pending CIR.   For detailed assessment and plan, please refer to above as I have made changes wherever appropriate.   Abigail Cummins, MD PhD Stroke Neurology 11/06/2024 11:08 PM    To contact Stroke Continuity provider, please refer to [email or web address]. After hours, contact General Neurology

## 2024-11-06 NOTE — Plan of Care (Signed)
 Family came to visit  her and remained with her during the day time.   Problem: Education: Goal: Knowledge of secondary prevention will improve (MUST DOCUMENT ALL) Outcome: Progressing   Problem: Education: Goal: Knowledge of patient specific risk factors will improve (DELETE if not current risk factor) Outcome: Progressing   Problem: Ischemic Stroke/TIA Tissue Perfusion: Goal: Complications of ischemic stroke/TIA will be minimized Outcome: Progressing   Problem: Coping: Goal: Will verbalize positive feelings about self Outcome: Progressing   Problem: Education: Goal: Knowledge of disease or condition will improve Outcome: Progressing   Problem: Clinical Measurements: Goal: Will remain free from infection Outcome: Progressing   Problem: Pain Managment: Goal: General experience of comfort will improve and/or be controlled Outcome: Progressing   Problem: Safety: Goal: Ability to remain free from injury will improve Outcome: Progressing   Problem: Skin Integrity: Goal: Risk for impaired skin integrity will decrease Outcome: Progressing

## 2024-11-06 NOTE — Progress Notes (Signed)
 Inpatient Rehab Admissions:  Inpatient Rehab Consult received.  I met with patient, daughter Warren and granddaughter at the bedside for rehabilitation assessment and to discuss goals and expectations of an inpatient rehab admission.  Discussed average length of stay, insurance authorization requirement and discharge home after completion of CIR. All acknowledged understanding. Pt is interested in pursuing CIR and family supportive. Warren confirmed that pt will have 24/7 support after discharge. Will continue to follow.    Signed: Tinnie Yvone Cohens, MS, CCC-SLP Admissions Coordinator [phone]

## 2024-11-07 DIAGNOSIS — I6381 Other cerebral infarction due to occlusion or stenosis of small artery: Secondary | ICD-10-CM | POA: Diagnosis not present

## 2024-11-07 DIAGNOSIS — E1151 Type 2 diabetes mellitus with diabetic peripheral angiopathy without gangrene: Secondary | ICD-10-CM | POA: Diagnosis not present

## 2024-11-07 DIAGNOSIS — R29701 NIHSS score 1: Secondary | ICD-10-CM | POA: Diagnosis not present

## 2024-11-07 DIAGNOSIS — I480 Paroxysmal atrial fibrillation: Secondary | ICD-10-CM | POA: Diagnosis not present

## 2024-11-07 NOTE — Progress Notes (Addendum)
 STROKE TEAM PROGRESS NOTE    INTERIM HISTORY/SUBJECTIVE No family at the bedside.  Patient is laying in bed in no apparent distress.  She states her left shoulder feels better after having some ice Tylenol ..  X-ray of left shoulder with no acute process Neurological exam remains stable and unchanged  CBC    Component Value Date/Time   WBC 7.9 11/05/2024 0400   RBC 4.17 11/05/2024 0400   HGB 12.3 11/05/2024 0400   HGB 12.7 02/20/2024 1222   HCT 36.7 11/05/2024 0400   HCT 39.2 02/20/2024 1222   PLT 198 11/05/2024 0400   PLT 258 02/20/2024 1222   MCV 88.0 11/05/2024 0400   MCV 92 02/20/2024 1222   MCH 29.5 11/05/2024 0400   MCHC 33.5 11/05/2024 0400   RDW 13.9 11/05/2024 0400   RDW 13.1 02/20/2024 1222   LYMPHSABS 1.0 11/04/2024 1110   MONOABS 0.8 11/04/2024 1110   EOSABS 0.1 11/04/2024 1110   BASOSABS 0.0 11/04/2024 1110    BMET    Component Value Date/Time   NA 137 11/05/2024 0400   NA 141 02/20/2024 1221   K 4.0 11/05/2024 0400   CL 102 11/05/2024 0400   CO2 25 11/05/2024 0400   GLUCOSE 139 (H) 11/05/2024 0400   BUN 13 11/05/2024 0400   BUN 11 02/20/2024 1221   CREATININE 0.82 11/05/2024 0400   CALCIUM  8.5 (L) 11/05/2024 0400   EGFR 63 02/20/2024 1221   GFRNONAA >60 11/05/2024 0400    IMAGING past 24 hours DG Shoulder Left Port Result Date: 11/06/2024 CLINICAL DATA:  Shoulder pain EXAM: LEFT SHOULDER COMPARISON:  None Available. FINDINGS: There is no evidence of fracture or dislocation. 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 Soft tissues are unremarkable. 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 Electronically Signed   By: Limin  Monterius Rolf M.D.   On: 11/06/2024 19:05     Vitals:   11/06/24 2343 11/07/24 0426 11/07/24 0751 11/07/24 1203  BP: 136/62 128/66 136/84 136/75  Pulse: 70 67 66 70  Resp:   18 20  Temp: 98.9 F (37.2 C) 97.9 F (36.6 C) 97.6 F (36.4 C) 98.1 F  (36.7 C)  TempSrc: Oral Oral Oral Oral  SpO2: 91% 92% 100% 99%  Weight:      Height:         PHYSICAL EXAM General:  Alert, well-nourished, well-developed patient in no acute distress Psych:  Mood and affect appropriate for situation CV: Regular rate and rhythm on monitor Respiratory:  Regular, unlabored respirations on room air GI: Abdomen soft and nontender   NEURO:  Mental Status: AA&Ox3, patient is able to give clear and coherent history Speech/Language: speech is without dysarthria or aphasia.  Naming, repetition, fluency, and comprehension intact.  Cranial Nerves:  II: PERRL. Visual fields full.  III, IV, VI: EOMI. Eyelids elevate symmetrically.  V: Sensation is intact to light touch and symmetrical to face.  VII: Face is symmetrical resting and smiling VIII: hearing intact to voice. IX, X: Palate elevates symmetrically. Phonation is normal.  KP:Dynloizm shrug 5/5. XII: tongue is midline without fasciculations. Motor: 5/5 strength in right arm and right leg, left arm patient is able to lift against gravity, leg able to lift antigravity with slight drift Tone: is normal and bulk is normal Sensation- Intact to light touch bilaterally. Extinction absent to light touch to DSS.   Coordination: FTN intact bilaterally, HKS: no ataxia in BLE.No drift.  Gait- deferred  Most Recent NIH  1a Level of Conscious.:  1b LOC Questions:  1c LOC Commands:  2 Best Gaze:  3 Visual:  4 Facial Palsy:  5a Motor Arm - left: 1 5b Motor Arm - Right:  6a Motor Leg - Left:  6b Motor Leg - Right:  7 Limb Ataxia:  8 Sensory:  9 Best Language:  10 Dysarthria:  11 Extinct. and Inatten.:  TOTAL: 1   ASSESSMENT/PLAN  Ms. JANEANN PAISLEY is a 78 y.o. female with history of hypertension, hyperlipidemia, diabetes, atrial fibrillation not on anticoagulation and Bell's palsy who presents with sudden onset left hemiparesis.  Patient was in her normal state of health earlier today and suddenly  developed left-sided weakness and fell over.  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 ASPECTS 10.    MRI: Vague area of restricted diffusion within the periventricular white matter and posterior limb of the right internal capsule, compatible with acute or subacute small vessel non-hemorrhagic infarct. MRA: Mild irregular stenosis of the right M1 segment, but no flow-limiting stenosis. Right vertebral artery is dominant and the left is hypoplastic. The left v4 segment is diminutive or moderate-to-severely stenotic proximally, with a patent distal segment. Diminutive left a1 segment with diffuse irregular contour. 24-hour CT scan no bleeding, infarct in the posterior limb of the right internal capsule and right CR 2D Echo EF 70 to 75%  LDL 151 HgbA1c 6.8 VTE prophylaxis -Eliquis  No antithrombotic prior to admission, now on Eliquis  Therapy recommendations:  CIR Disposition: Pending  Atrial fibrillation Home Meds: None Had episode during 2020 when she had sepsis. Was on Xarelto  Had rectal bleeding and given follow-up revealed no further atrial fibrillation so Xarelto  was discontinued 02/2024 followed with Dr. Petwardhan and Ziopatch 7 day no afib Now given stroke will begin Eliquis  after discussed with Dr. Elmira however patient does not have medication coverage so will need to figure out if she can afford this Continue telemetry monitoring  Hypertension Home meds: None Stable Long-term BP goal normotensive  Hyperlipidemia Home meds: None LDL 151, goal < 70 Add Crestor  20 mg Continue statin at discharge  Diabetes type II Controlled Home meds: None HgbA1c 6.8 goal < 7.0 CBGs SSI Recommend close follow-up with PCP for better DM control  Hypothyroidism Start home Synthroid  150 mcg  Left shoulder pain  s/p fall   shoulder x-ray Moderate degenerative changes of the acromioclavicular and glenohumeral joints.    Other Stroke Risk Factors  Obesity, Body mass index is 44.75 kg/m., BMI >/= 30 associated with increased stroke risk, recommend weight loss, diet and exercise as appropriate   Congestive heart failure  Other medical issues History of left Bell's palsy  Hospital day # 3  Karna Geralds DNP, ACNPC-AG  [facility]  ATTENDING NOTE: I reviewed above note and agree with the assessment and plan. Pt was seen and examined.   Pt on the phone with other family members. Still has left hemiparesis but slowly improving. On eliquis  and statin but needs to figure out affordability of eliquis . Plan for CIR tomorrow.   For detailed assessment and plan, please refer to above as I have made changes wherever appropriate.   Ary Cummins, MD PhD Stroke Neurology 11/07/2024 10:49 PM      To contact Stroke Continuity provider, please refer to [email or web address]. After hours, contact General Neurology

## 2024-11-07 NOTE — Plan of Care (Signed)
  Problem: Education: Goal: Knowledge of disease or condition will improve Outcome: Progressing   Problem: Ischemic Stroke/TIA Tissue Perfusion: Goal: Complications of ischemic stroke/TIA will be minimized Outcome: Progressing   Problem: Coping: Goal: Will verbalize positive feelings about self Outcome: Progressing   Problem: Health Behavior/Discharge Planning: Goal: Goals will be collaboratively established with patient/family Outcome: Progressing   Problem: Self-Care: Goal: Ability to communicate needs accurately will improve Outcome: Progressing   Problem: Nutrition: Goal: Risk of aspiration will decrease Outcome: Progressing Goal: Dietary intake will improve Outcome: Progressing   Problem: Clinical Measurements: Goal: Cardiovascular complication will be avoided Outcome: Progressing   Problem: Elimination: Goal: Will not experience complications related to bowel motility Outcome: Progressing Goal: Will not experience complications related to urinary retention Outcome: Progressing   Problem: Safety: Goal: Ability to remain free from injury will improve Outcome: Progressing

## 2024-11-07 NOTE — Plan of Care (Signed)

## 2024-11-08 ENCOUNTER — Other Ambulatory Visit (HOSPITAL_COMMUNITY): Payer: Self-pay

## 2024-11-08 DIAGNOSIS — R29701 NIHSS score 1: Secondary | ICD-10-CM | POA: Diagnosis not present

## 2024-11-08 DIAGNOSIS — I6381 Other cerebral infarction due to occlusion or stenosis of small artery: Secondary | ICD-10-CM | POA: Diagnosis not present

## 2024-11-08 DIAGNOSIS — I110 Hypertensive heart disease with heart failure: Secondary | ICD-10-CM | POA: Diagnosis not present

## 2024-11-08 DIAGNOSIS — I480 Paroxysmal atrial fibrillation: Secondary | ICD-10-CM | POA: Diagnosis not present

## 2024-11-08 LAB — URINALYSIS, W/ REFLEX TO CULTURE (INFECTION SUSPECTED)
Bilirubin Urine: NEGATIVE
Glucose, UA: NEGATIVE mg/dL
Hgb urine dipstick: NEGATIVE
Ketones, ur: NEGATIVE mg/dL
Leukocytes,Ua: NEGATIVE
Nitrite: POSITIVE — AB
Protein, ur: NEGATIVE mg/dL
Specific Gravity, Urine: >1.03 — ABNORMAL HIGH (ref 1.005–1.030)
pH: 6 (ref 5.0–8.0)

## 2024-11-08 MED ORDER — ACETAMINOPHEN 650 MG RE SUPP: 650.0000 mg | RECTAL | Status: DC | PRN

## 2024-11-08 MED ORDER — ACETAMINOPHEN 325 MG PO TABS
650.0000 mg | ORAL_TABLET | ORAL | Status: DC | PRN
  Administered 2024-11-08: 650 mg via ORAL
  Filled 2024-11-08: qty 2

## 2024-11-08 MED ORDER — ACETAMINOPHEN 160 MG/5ML PO SOLN: 650.0000 mg | ORAL | Status: DC | PRN

## 2024-11-08 NOTE — Progress Notes (Signed)
Inpatient Rehab Admissions Coordinator:  Saw pt at bedside. Insurance authorization started. Will continue to follow.   Wolfgang Phoenix, MS, CCC-SLP Admissions Coordinator [phone]

## 2024-11-08 NOTE — Progress Notes (Signed)
 Physical Therapy Treatment Patient Details Name: Abigail Ryan MRN: [id number] DOB: 1946/02/20 Today's Date: 11/08/2024   History of Present Illness 78 yo female presents to [facility] 11/27 with dizziness, L weakness. 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 PMHx hypertension, hyperlipidemia, diabetes, Bell's palsy and A-fib not on anticoagulation    PT Comments  Pt received in supine and agreeable to session. Pt demonstrates good motivation to progress mobility. Pt able to tolerate multiple transfer trials and a short gait distance with up to mod A due to weakness. Pt demonstrates L knee hyperextension during WB, but no buckling noted this session. Pt instructed in LLE exercises for increased strength and control. Patient will benefit from intensive inpatient follow-up therapy, >3 hours/day to maximize progress towards functional independence. Pt continues to benefit from PT services to progress toward functional mobility goals.     If plan is discharge home, recommend the following: A little help with bathing/dressing/bathroom;A lot of help with walking and/or transfers   Can travel by private vehicle        Equipment Recommendations  None recommended by PT    Recommendations for Other Services       Precautions / Restrictions Precautions Precautions: Fall Recall of Precautions/Restrictions: Intact Restrictions Weight Bearing Restrictions Per Provider Order: No     Mobility  Bed Mobility Overal bed mobility: Needs Assistance Bed Mobility: Supine to Sit, Sit to Supine     Supine to sit: Mod assist, HOB elevated, Used rails Sit to supine: Mod assist, Used rails   General bed mobility comments: Pt initially advancing BLE to L side of the bed, then to the R due to line constraints. Pt demonstrates increased difficulty sitting to R side due to L shoulder pain/ decreased  ROM. Assist for BLE elevation back to EOB    Transfers Overall transfer level: Needs assistance Equipment used: Rolling walker (2 wheels) Transfers: Sit to/from Stand, Bed to chair/wheelchair/BSC Sit to Stand: Mod assist   Step pivot transfers: Min assist       General transfer comment: STS from EOB and recliner with mod A for power up and pt demonstrating good carryover of hand placement from previous session. Pivot to recliner and back to EOB with assist for L knee blocking and cues for sequencing    Ambulation/Gait Ambulation/Gait assistance: Min assist Gait Distance (Feet): 4 Feet Assistive device: Rolling walker (2 wheels) Gait Pattern/deviations: Step-to pattern, Decreased step length - left, Decreased stance time - left, Knee hyperextension - left, Decreased step length - right, Decreased stride length, Decreased weight shift to left Gait velocity: decr     General Gait Details: Pt demonstrates L knee instability with hyperextension, but no overt buckling noted. Pt able to advance LLE, but requires cues for increased B step length   Stairs             Wheelchair Mobility     Tilt Bed    Modified Rankin (Stroke Patients Only) Modified Rankin (Stroke Patients Only) Pre-Morbid Rankin Score: No symptoms Modified Rankin: Moderately severe disability     Balance Overall balance assessment: Needs assistance Sitting-balance support: Feet supported Sitting balance-Leahy Scale: Good Sitting balance - Comments: EOB   Standing balance support: Bilateral upper extremity supported, During functional activity Standing balance-Leahy Scale: Poor Standing balance comment: reliant on BUE support when standing  Communication Communication Communication: No apparent difficulties  Cognition Arousal: Alert Behavior During Therapy: WFL for tasks assessed/performed   PT - Cognitive impairments: No apparent impairments                          Following commands: Intact      Cueing Cueing Techniques: Verbal cues  Exercises General Exercises - Lower Extremity Quad Sets: AROM, Seated, Both, 5 reps Long Arc Quad: AROM, Seated, Both, 5 reps Hip Flexion/Marching: AROM, Standing, Both, 5 reps    General Comments        Pertinent Vitals/Pain Pain Assessment Pain Assessment: No/denies pain     PT Goals (current goals can now be found in the care plan section) Acute Rehab PT Goals PT Goal Formulation: With patient Time For Goal Achievement: 11/19/24 Progress towards PT goals: Progressing toward goals    Frequency    Min 2X/week       AM-PAC PT 6 Clicks Mobility   Outcome Measure  Help needed turning from your back to your side while in a flat bed without using bedrails?: A Little Help needed moving from lying on your back to sitting on the side of a flat bed without using bedrails?: A Lot Help needed moving to and from a bed to a chair (including a wheelchair)?: A Little Help needed standing up from a chair using your arms (e.g., wheelchair or bedside chair)?: A Lot Help needed to walk in hospital room?: A Lot Help needed climbing 3-5 steps with a railing? : A Lot 6 Click Score: 14    End of Session Equipment Utilized During Treatment: Gait belt Activity Tolerance: Patient tolerated treatment well Patient left: in bed;with nursing/sitter in room;with call bell/phone within reach;with bed alarm set Nurse Communication: Mobility status PT Visit Diagnosis: Other abnormalities of gait and mobility (R26.89);Muscle weakness (generalized) (M62.81)     Time: 1128-1209 PT Time Calculation (min) (ACUTE ONLY): 41 min  Charges:    $Gait Training: 8-22 mins $Therapeutic Exercise: 8-22 mins $Therapeutic Activity: 8-22 mins PT General Charges $$ ACUTE PT VISIT: 1 Visit                    Darryle George, PTA Acute Rehabilitation Services Secure Chat Preferred  [phone]     Darryle George 11/08/2024, 12:43 PM

## 2024-11-08 NOTE — Progress Notes (Addendum)
 STROKE TEAM PROGRESS NOTE    INTERIM HISTORY/SUBJECTIVE No family at the bedside.  Patient is laying in bed in no apparent distress.   Neurological exam remains stable and unchanged  Discussed Eliquis . However, patient does not have Medicare prescription drug coverage and will not be able to afford this. She declined enrolling in Part D coverage and stated that she does not want to take Eliquis  at home, would rather take her supplements. Planned to remain on eliquis  inpatient and discuss outpatient plan further.  She is pending insurance approval for discharge to CIR.   CBC    Component Value Date/Time   WBC 7.9 11/05/2024 0400   RBC 4.17 11/05/2024 0400   HGB 12.3 11/05/2024 0400   HGB 12.7 02/20/2024 1222   HCT 36.7 11/05/2024 0400   HCT 39.2 02/20/2024 1222   PLT 198 11/05/2024 0400   PLT 258 02/20/2024 1222   MCV 88.0 11/05/2024 0400   MCV 92 02/20/2024 1222   MCH 29.5 11/05/2024 0400   MCHC 33.5 11/05/2024 0400   RDW 13.9 11/05/2024 0400   RDW 13.1 02/20/2024 1222   LYMPHSABS 1.0 11/04/2024 1110   MONOABS 0.8 11/04/2024 1110   EOSABS 0.1 11/04/2024 1110   BASOSABS 0.0 11/04/2024 1110    BMET    Component Value Date/Time   NA 137 11/05/2024 0400   NA 141 02/20/2024 1221   K 4.0 11/05/2024 0400   CL 102 11/05/2024 0400   CO2 25 11/05/2024 0400   GLUCOSE 139 (H) 11/05/2024 0400   BUN 13 11/05/2024 0400   BUN 11 02/20/2024 1221   CREATININE 0.82 11/05/2024 0400   CALCIUM  8.5 (L) 11/05/2024 0400   EGFR 63 02/20/2024 1221   GFRNONAA >60 11/05/2024 0400    IMAGING past 24 hours No results found.    Vitals:   11/08/24 0012 11/08/24 0424 11/08/24 0745 11/08/24 1133  BP: 132/70 133/68 101/61 (!) 148/73  Pulse: 70 70 72 68  Resp:      Temp: (!) 97.4 F (36.3 C) 97.6 F (36.4 C) 98.8 F (37.1 C) 98 F (36.7 C)  TempSrc: Oral Oral Oral Oral  SpO2: 96% 96% 98% 97%  Weight:      Height:       PHYSICAL EXAM General:  Alert, well-nourished, well-developed  patient in no acute distress Psych:  Mood and affect appropriate for situation CV: Regular rate and rhythm on monitor Respiratory:  Regular, unlabored respirations on room air GI: Abdomen soft and nontender  NEURO:  Mental Status: AA&Ox3, patient is able to give clear and coherent history Speech/Language: speech is without dysarthria or aphasia.  Naming, repetition, fluency, and comprehension intact.  Cranial Nerves:  II: PERRL. Visual fields full.  III, IV, VI: EOMI. Eyelids elevate symmetrically.  V: Sensation is intact to light touch and symmetrical to face.  VII: Face is symmetrical resting and smiling VIII: hearing intact to voice. IX, X: Palate elevates symmetrically. Phonation is normal.  KP:Dynloizm shrug 5/5. XII: tongue is midline without fasciculations. Motor: 5/5 strength in right arm and right leg, left arm patient is able to lift against gravity, leg able to lift antigravity with slight drift Tone: is normal and bulk is normal Sensation- Intact to light touch bilaterally. Extinction absent to light touch to DSS.   Coordination: FTN intact bilaterally, HKS: no ataxia in BLE.No drift.  Gait- deferred  Most Recent NIH: 1  ASSESSMENT/PLAN  [name] is a 78 y.o. female with history of hypertension, hyperlipidemia, diabetes,  atrial fibrillation not on anticoagulation and Bell's palsy who presents with sudden onset left hemiparesis.  Patient was in her normal state of health earlier today and suddenly developed left-sided weakness and fell over.  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 ASPECTS 10.    MRI: Vague area of restricted diffusion within the periventricular white matter and posterior limb of the right internal capsule, compatible with acute or subacute small vessel non-hemorrhagic  infarct. MRA: Mild irregular stenosis of the right M1 segment, but no flow-limiting stenosis. Right vertebral artery is dominant and the left is hypoplastic. The left v4 segment is diminutive or moderate-to-severely stenotic proximally, with a patent distal segment. Diminutive left a1 segment with diffuse irregular contour. 24-hour CT scan no bleeding, infarct in the posterior limb of the right internal capsule and right CR 2D Echo EF 70 to 75%  LDL 151 HgbA1c 6.8 VTE prophylaxis -Eliquis  No antithrombotic prior to admission, continue Eliquis  Therapy recommendations:  CIR Disposition: Pending  Atrial fibrillation Home Meds: None Had episode during 2020 when she had sepsis. Was on Xarelto  Had rectal bleeding and given follow-up revealed no further atrial fibrillation so Xarelto  was discontinued 02/2024 followed with Dr. Petwardhan and Ziopatch 7 day no afib Now given stroke will begin Eliquis  after discussed with Dr. Elmira However, patient does not have Medicare prescription drug coverage and will not be able to afford this. She declined enrolling in Part D coverage and stated that she does not want to take Eliquis  at home, would rather take her supplements. Planned to remain on eliquis  inpatient and discuss outpatient plan further Continue telemetry monitoring  Hypertension Home meds: None Stable Long-term BP goal normotensive  Hyperlipidemia Home meds: None LDL 151, goal < 70 Continue Crestor  20 mg Continue statin at discharge  Diabetes type II Controlled Home meds: None HgbA1c 6.8 goal < 7.0  Hypothyroidism Continue home Synthroid  150 mcg  Left shoulder pain s/p fall   X-ray: Moderate degenerative changes of the acromioclavicular and glenohumeral joints. Continue PRN Tylenol    Other Stroke Risk Factors  Obesity, Body mass index is 44.75 kg/m., BMI >/= 30 associated with increased stroke risk, recommend weight loss, diet and exercise as appropriate   Congestive heart  failure  Other medical issues History of left Bell's palsy  Hospital day # 4   Pt seen by Neuro NP/APP with MD. Note/plan to be edited by MD as needed.    Rocky JAYSON Likes, DNP [facility] Please use AMION for contact information & EPIC for messaging.  I have personally obtained history,examined this patient, reviewed notes, independently viewed imaging studies, participated in medical decision making and plan of care.ROS completed by me personally and pertinent positives fully documented  I have made any additions or clarifications directly to the above note. Agree with note above.  Patient neurologically stable to be transferred to inpatient rehab when bed available.  Recommend Eliquis  for secondary stroke prevention.  No family at the bedside.  Eather Popp, MD Medical Director [facility] Pager: [phone] 11/08/2024 3:19 PM

## 2024-11-08 NOTE — Progress Notes (Signed)
 Occupational Therapy Treatment Patient Details Name: Abigail Ryan MRN: [id number] DOB: [birth year] Today's Date: 11/08/2024   History of present illness 78 yo female presents to [facility] 11/27 with dizziness, L weakness. 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 PMHx hypertension, hyperlipidemia, diabetes, Bell's palsy and A-fib not on anticoagulation   OT comments  Patient received in supine and eager for OT and to get OOB. Patient able to get to EOB with verbal cues and mod assist due to assistance with LLE and scooting to EOB.  Patient able to stand from raised bed with min assist and mod assist for step pivot transfer to recliner. Patient able to perform grooming with setup and max assist for doffing and donning socks due to difficulty reaching feet.  Patient may benefit from AE training to address LB dressing.  Patient will benefit from intensive inpatient follow-up therapy, >3 hours/day.  Acute OT to continue to follow to address established goals to facilitate DC to next venue of care.        If plan is discharge home, recommend the following:  A lot of help with walking and/or transfers;A lot of help with bathing/dressing/bathroom;Assistance with cooking/housework;Assist for transportation;Supervision due to cognitive status;Help with stairs or ramp for entrance   Equipment Recommendations  Other (comment) (defer)    Recommendations for Other Services      Precautions / Restrictions Precautions Precautions: Fall Recall of Precautions/Restrictions: Intact Restrictions Weight Bearing Restrictions Per Provider Order: No       Mobility Bed Mobility Overal bed mobility: Needs Assistance Bed Mobility: Supine to Sit     Supine to sit: Mod assist, HOB elevated, Used rails     General bed mobility comments: cues for rail use and assistance with LLE and scooting to EOB     Transfers Overall transfer level: Needs assistance Equipment used: Rolling walker (2 wheels) Transfers: Sit to/from Stand, Bed to chair/wheelchair/BSC Sit to Stand: Min assist, From elevated surface     Step pivot transfers: Mod assist     General transfer comment: mod assist for step pivot transfer due to assistance with balance and walker management     Balance Overall balance assessment: Needs assistance Sitting-balance support: Feet supported Sitting balance-Leahy Scale: Good Sitting balance - Comments: EOB   Standing balance support: Bilateral upper extremity supported, During functional activity Standing balance-Leahy Scale: Poor Standing balance comment: reliant on BUE support when standing                           ADL either performed or assessed with clinical judgement   ADL Overall ADL's : Needs assistance/impaired     Grooming: Wash/dry hands;Wash/dry face;Oral care;Brushing hair;Set up;Sitting Grooming Details (indicate cue type and reason): in recliner             Lower Body Dressing: Maximal assistance Lower Body Dressing Details (indicate cue type and reason): patient unable to doff or donn socks Toilet Transfer: Moderate assistance;Rolling walker (2 wheels)             General ADL Comments: LUE weakness but strength increasing    Extremity/Trunk Assessment              Vision       Perception     Praxis     Communication Communication Communication: No apparent difficulties   Cognition Arousal: Alert Behavior  During Therapy: WFL for tasks assessed/performed Cognition: No apparent impairments             OT - Cognition Comments: pleasant and eager to get OOB                 Following commands: Intact        Cueing   Cueing Techniques: Verbal cues  Exercises      Shoulder Instructions       General Comments      Pertinent Vitals/ Pain       Pain Assessment Pain Assessment: Faces Faces  Pain Scale: Hurts a little bit Pain Location: back, chronic Pain Descriptors / Indicators: Discomfort Pain Intervention(s): Limited activity within patient's tolerance, Monitored during session, Repositioned  Home Living                                          Prior Functioning/Environment              Frequency  Min 2X/week        Progress Toward Goals  OT Goals(current goals can now be found in the care plan section)  Progress towards OT goals: Progressing toward goals  Acute Rehab OT Goals Patient Stated Goal: to go to rehab OT Goal Formulation: With patient Time For Goal Achievement: 11/19/24 Potential to Achieve Goals: Good ADL Goals Pt Will Perform Grooming: with contact guard assist;standing Pt Will Perform Upper Body Dressing: with modified independence Pt Will Perform Lower Body Dressing: with contact guard assist;sit to/from stand Pt Will Transfer to Toilet: with contact guard assist;ambulating Pt/caregiver will Perform Home Exercise Program: Increased ROM;Increased strength;Right Upper extremity;With written HEP provided  Plan      Co-evaluation                 AM-PAC OT 6 Clicks Daily Activity     Outcome Measure   Help from another person eating meals?: A Little Help from another person taking care of personal grooming?: A Little Help from another person toileting, which includes using toliet, bedpan, or urinal?: A Lot Help from another person bathing (including washing, rinsing, drying)?: A Lot Help from another person to put on and taking off regular upper body clothing?: A Little Help from another person to put on and taking off regular lower body clothing?: A Lot 6 Click Score: 15    End of Session Equipment Utilized During Treatment: Gait belt;Rolling walker (2 wheels)  OT Visit Diagnosis: Unsteadiness on feet (R26.81);Other abnormalities of gait and mobility (R26.89);Muscle weakness (generalized)  (M62.81);Hemiplegia and hemiparesis Hemiplegia - Right/Left: Left Hemiplegia - dominant/non-dominant: Non-Dominant   Activity Tolerance Patient tolerated treatment well   Patient Left in chair;with call bell/phone within reach;with chair alarm set   Nurse Communication Mobility status        Time: 9282-9259 OT Time Calculation (min): 23 min  Charges: OT General Charges $OT Visit: 1 Visit OT Treatments $Self Care/Home Management : 23-37 mins  Abigail Ryan, OTA Acute Rehabilitation Services  Office [phone]   Abigail Ryan 11/08/2024, 9:01 AM

## 2024-11-08 NOTE — Plan of Care (Signed)
 Sat in chair for more than an hour in daytime. Family came to visit her. Plan to go to CIR. Problem: Education: Goal: Knowledge of secondary prevention will improve (MUST DOCUMENT ALL) Outcome: Progressing   Problem: Education: Goal: Knowledge of patient specific risk factors will improve (DELETE if not current risk factor) Outcome: Progressing   Problem: Ischemic Stroke/TIA Tissue Perfusion: Goal: Complications of ischemic stroke/TIA will be minimized Outcome: Progressing   Problem: Clinical Measurements: Goal: Will remain free from infection Outcome: Progressing   Problem: Activity: Goal: Risk for activity intolerance will decrease Outcome: Progressing   Problem: Nutrition: Goal: Adequate nutrition will be maintained Outcome: Progressing

## 2024-11-08 NOTE — Discharge Instructions (Signed)

## 2024-11-08 NOTE — TOC CM/SW Note (Signed)
 Transition of Care [facility]) - Inpatient Brief Assessment   Patient Details  Name: Abigail Ryan MRN: [id number] Date of Birth: 1946/12/09  Transition of [facility]) CM/SW Contact:    Almarie CHRISTELLA Goodie, LCSW Phone Number: 11/08/2024, 3:31 PM   Clinical Narrative:     CSW updated by MD that patient does not have Part D for prescription coverage, to please discuss with patient. CSW met with patient, she does not take medications so did not sign up for Part D if she wasn't going to use it. Patient reported she is not interested in taking Eliquis , will take her supplements instead and she had told the doctor that herself. Patient feeling very encouraged by her progress with PT and looking forward to CIR admission to get back home before Christmas, supportive family at bedside.    Transition of Care Asessment: Insurance and Status: Insurance coverage has been reviewed Patient has primary care physician: Yes Home environment has been reviewed: Home alone Prior level of function:: Independent Prior/Current Home Services: No current home services Social Drivers of Health Review: SDOH reviewed no interventions necessary Readmission risk has been reviewed: No Transition of care needs: transition of care needs identified, TOC will continue to follow

## 2024-11-09 ENCOUNTER — Other Ambulatory Visit: Payer: Self-pay

## 2024-11-09 ENCOUNTER — Inpatient Hospital Stay (HOSPITAL_COMMUNITY)
Admission: AD | Admit: 2024-11-09 | Discharge: 2024-11-18 | DRG: 057 | Disposition: A | Source: Intra-hospital | Attending: Physical Medicine and Rehabilitation | Admitting: Physical Medicine and Rehabilitation

## 2024-11-09 ENCOUNTER — Encounter (HOSPITAL_COMMUNITY): Payer: Self-pay | Admitting: Physical Medicine and Rehabilitation

## 2024-11-09 DIAGNOSIS — R29701 NIHSS score 1: Secondary | ICD-10-CM | POA: Diagnosis not present

## 2024-11-09 DIAGNOSIS — I639 Cerebral infarction, unspecified: Secondary | ICD-10-CM | POA: Diagnosis not present

## 2024-11-09 DIAGNOSIS — I6381 Other cerebral infarction due to occlusion or stenosis of small artery: Secondary | ICD-10-CM | POA: Diagnosis not present

## 2024-11-09 LAB — BASIC METABOLIC PANEL WITH GFR
Anion gap: 8 (ref 5–15)
BUN: 18 mg/dL (ref 8–23)
CO2: 27 mmol/L (ref 22–32)
Calcium: 8.9 mg/dL (ref 8.9–10.3)
Chloride: 99 mmol/L (ref 98–111)
Creatinine, Ser: 0.88 mg/dL (ref 0.44–1.00)
GFR, Estimated: >60 mL/min (ref >60)
Glucose, Bld: 132 mg/dL — ABNORMAL HIGH (ref 70–99)
Potassium: 4.7 mmol/L (ref 3.5–5.1)
Sodium: 134 mmol/L — ABNORMAL LOW (ref 135–145)

## 2024-11-09 LAB — URINALYSIS, W/ REFLEX TO CULTURE (INFECTION SUSPECTED)
Bilirubin Urine: NEGATIVE
Glucose, UA: NEGATIVE mg/dL
Hgb urine dipstick: NEGATIVE
Ketones, ur: NEGATIVE mg/dL
Nitrite: POSITIVE — AB
Protein, ur: NEGATIVE mg/dL
Specific Gravity, Urine: 1.01 (ref 1.005–1.030)
pH: 6 (ref 5.0–8.0)

## 2024-11-09 LAB — GLUCOSE, CAPILLARY
Glucose-Capillary: 159 mg/dL — ABNORMAL HIGH (ref 70–99)
Glucose-Capillary: 161 mg/dL — ABNORMAL HIGH (ref 70–99)

## 2024-11-09 MED ORDER — PROCHLORPERAZINE MALEATE 5 MG PO TABS: 5.0000 mg | ORAL_TABLET | Freq: Four times a day (QID) | ORAL | Status: DC | PRN

## 2024-11-09 MED ORDER — MELATONIN 3 MG PO TABS
3.0000 mg | ORAL_TABLET | Freq: Every day | ORAL | Status: DC
  Administered 2024-11-09 – 2024-11-17 (×8): 3 mg via ORAL
  Filled 2024-11-09 (×8): qty 1

## 2024-11-09 MED ORDER — INSULIN ASPART 100 UNIT/ML IJ SOLN
0.0000 [IU] | Freq: Three times a day (TID) | INTRAMUSCULAR | Status: DC
  Filled 2024-11-09: qty 2

## 2024-11-09 MED ORDER — DIPHENHYDRAMINE HCL 25 MG PO CAPS
25.0000 mg | ORAL_CAPSULE | Freq: Four times a day (QID) | ORAL | Status: DC | PRN
  Administered 2024-11-14: 25 mg via ORAL
  Filled 2024-11-09: qty 1

## 2024-11-09 MED ORDER — PROCHLORPERAZINE 25 MG RE SUPP: 12.5000 mg | Freq: Four times a day (QID) | RECTAL | Status: DC | PRN

## 2024-11-09 MED ORDER — ALUM & MAG HYDROXIDE-SIMETH 200-200-20 MG/5ML PO SUSP: 30.0000 mL | ORAL | Status: DC | PRN

## 2024-11-09 MED ORDER — FLEET ENEMA RE ENEM: 1.0000 | ENEMA | Freq: Once | RECTAL | Status: DC | PRN

## 2024-11-09 MED ORDER — CALCIUM CITRATE 950 (200 CA) MG PO TABS
200.0000 mg | ORAL_TABLET | Freq: Every day | ORAL | Status: DC
  Administered 2024-11-09 – 2024-11-17 (×9): 950 mg via ORAL
  Filled 2024-11-09 (×9): qty 1

## 2024-11-09 MED ORDER — NITROGLYCERIN 0.4 MG SL SUBL: 0.4000 mg | SUBLINGUAL_TABLET | SUBLINGUAL | Status: DC | PRN

## 2024-11-09 MED ORDER — APIXABAN 5 MG PO TABS: 5.0000 mg | ORAL_TABLET | Freq: Two times a day (BID) | ORAL | Status: DC

## 2024-11-09 MED ORDER — SENNOSIDES-DOCUSATE SODIUM 8.6-50 MG PO TABS: 1.0000 | ORAL_TABLET | Freq: Every evening | ORAL | Status: DC | PRN

## 2024-11-09 MED ORDER — INSULIN ASPART 100 UNIT/ML IJ SOLN: 0.0000 [IU] | Freq: Every day | INTRAMUSCULAR | Status: DC

## 2024-11-09 MED ORDER — GUAIFENESIN-DM 100-10 MG/5ML PO SYRP: 5.0000 mL | ORAL_SOLUTION | Freq: Four times a day (QID) | ORAL | Status: DC | PRN

## 2024-11-09 MED ORDER — LIDOCAINE HCL URETHRAL/MUCOSAL 2 % EX GEL: CUTANEOUS | Status: DC | PRN

## 2024-11-09 MED ORDER — PANTOPRAZOLE SODIUM 40 MG IV SOLR
40.0000 mg | Freq: Every day | INTRAVENOUS | Status: DC
  Filled 2024-11-09 (×2): qty 10

## 2024-11-09 MED ORDER — ROSUVASTATIN CALCIUM 20 MG PO TABS
20.0000 mg | ORAL_TABLET | Freq: Every day | ORAL | Status: DC
  Administered 2024-11-10 – 2024-11-11 (×2): 20 mg via ORAL
  Filled 2024-11-09 (×3): qty 1

## 2024-11-09 MED ORDER — ACETAMINOPHEN 325 MG PO TABS
325.0000 mg | ORAL_TABLET | ORAL | Status: DC | PRN
  Administered 2024-11-10 – 2024-11-16 (×6): 650 mg via ORAL
  Administered 2024-11-17: 325 mg via ORAL
  Administered 2024-11-17 – 2024-11-18 (×2): 650 mg via ORAL
  Filled 2024-11-09 (×5): qty 2
  Filled 2024-11-09: qty 1
  Filled 2024-11-09 (×3): qty 2

## 2024-11-09 MED ORDER — MELATONIN 5 MG PO TABS
5.0000 mg | ORAL_TABLET | Freq: Every evening | ORAL | Status: DC | PRN
  Administered 2024-11-14: 5 mg via ORAL
  Filled 2024-11-09: qty 1

## 2024-11-09 MED ORDER — BISACODYL 10 MG RE SUPP: 10.0000 mg | Freq: Every day | RECTAL | Status: DC | PRN

## 2024-11-09 MED ORDER — PROCHLORPERAZINE EDISYLATE 10 MG/2ML IJ SOLN: 5.0000 mg | Freq: Four times a day (QID) | INTRAMUSCULAR | Status: DC | PRN

## 2024-11-09 MED ORDER — CHLORHEXIDINE GLUCONATE CLOTH 2 % EX PADS
6.0000 | MEDICATED_PAD | Freq: Every day | CUTANEOUS | Status: DC
  Administered 2024-11-10: 6 via TOPICAL

## 2024-11-09 MED ORDER — APIXABAN 5 MG PO TABS
5.0000 mg | ORAL_TABLET | Freq: Two times a day (BID) | ORAL | Status: DC
  Administered 2024-11-09 – 2024-11-18 (×18): 5 mg via ORAL
  Filled 2024-11-09 (×2): qty 1
  Filled 2024-11-09: qty 2
  Filled 2024-11-09 (×15): qty 1

## 2024-11-09 MED ORDER — LEVOTHYROXINE SODIUM 75 MCG PO TABS
150.0000 ug | ORAL_TABLET | Freq: Every day | ORAL | Status: DC
  Administered 2024-11-10 – 2024-11-18 (×9): 150 ug via ORAL
  Filled 2024-11-09 (×9): qty 2

## 2024-11-09 MED ORDER — ROSUVASTATIN CALCIUM 20 MG PO TABS: 20.0000 mg | ORAL_TABLET | Freq: Every day | ORAL | Status: DC

## 2024-11-09 NOTE — Plan of Care (Signed)
  Problem: Health Behavior/Discharge Planning: Goal: Ability to manage health-related needs will improve Outcome: Progressing Goal: Goals will be collaboratively established with patient/family Outcome: Progressing   Problem: Nutrition: Goal: Risk of aspiration will decrease Outcome: Progressing Goal: Dietary intake will improve Outcome: Progressing   Problem: Clinical Measurements: Goal: Ability to maintain clinical measurements within normal limits will improve Outcome: Progressing Goal: Will remain free from infection Outcome: Progressing Goal: Diagnostic test results will improve Outcome: Progressing Goal: Respiratory complications will improve Outcome: Progressing Goal: Cardiovascular complication will be avoided Outcome: Progressing   Problem: Activity: Goal: Risk for activity intolerance will decrease Outcome: Progressing   Problem: Elimination: Goal: Will not experience complications related to bowel motility Outcome: Progressing Goal: Will not experience complications related to urinary retention Outcome: Progressing   Problem: Pain Managment: Goal: General experience of comfort will improve and/or be controlled Outcome: Progressing   Problem: Safety: Goal: Ability to remain free from injury will improve Outcome: Progressing   Problem: Skin Integrity: Goal: Risk for impaired skin integrity will decrease Outcome: Progressing

## 2024-11-09 NOTE — Progress Notes (Signed)
 Patient ID: Abigail Ryan, female   DOB: 1946-01-07, 78 y.o.   MRN: [id number] Met with the patient to review current medical situation, rehab process, team conference and plan of care. Discussed secondary risk management including HTN, HLD on Crestor  and A-fib on Eliquis . Reviewed OSA/sleep study and prediabetes (A1C 6.8). Patient noted she is willing to take Eliquis  and Crestor  while in the hospital but preferred to go back on supplements at discharge due to finances.  Also reviewed HH diet modification recommendations. Note strong urine; UA + but not on medication and noted incontinent PTA; using incontinence pads. Reviewed assessment findings with MD/PA.  Continue to follow along to address educational needs to facilitate preparation for discharge. Fredericka Barnie KATHEE

## 2024-11-09 NOTE — Care Management Important Message (Signed)
 Important Message  Patient Details  Name: Abigail Ryan MRN: [id number] Date of Birth: 1946/10/22   Important Message Given:  Yes - Medicare IM     Claretta Deed 11/09/2024, 12:21 PM

## 2024-11-09 NOTE — H&P (Signed)
 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 She was found to have sensory deficits on left face also. CT head negative and TNKase  administered. MRI brain done revealing vague area of restricted diffusion within white matter and posterior limb right internal capsule c/w acute or subacute small vessel infarct and mild to moderate small vessel disease. MRA brain revealed diminutive L-VA with V4 segment severely stenotic. 2 D echo showed EF 70-75% with no wall abnormality.  Dr. 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 Elmira.  Of note; she had been on Xarelto  in 2020 after episode of A fib in setting of sepsis but this was d/c due to rectal bleeding and no further A fib episodes. Crestor  added due to LDL 151 and below goal.   She also has not signed up for Medicare part D and plans on natural substitute for blood thinner. Left shoulder pain reported post fall and X rays done showing moderate degenerative changes AC and glenohumeral joints. She has had improvement in left sided weakness but continues to be limited by weakness with left knee instability and requires min assist for mobility and min to max assist with ADLs. She lives alone, was independent PTA and CIR recommended due to functional decline.   Pt reports cannot afford Eliquis  and has no desire to take.  [place] 3 STE. Using purewick.     Review of Systems  Constitutional:  Positive for malaise/fatigue.  HENT: Negative.    Eyes: Negative.  Negative for blurred vision, double vision and discharge.  Respiratory: Negative.     Cardiovascular: Negative.  Negative for chest pain and leg swelling.  Gastrointestinal:  Negative for constipation, diarrhea, nausea and vomiting.  Genitourinary:  Positive for frequency.       Using purewick  Musculoskeletal:  Positive for joint pain and myalgias.       L shoulder and L upper arm pain from fall  Skin: Negative.   Neurological:  Positive for tingling, focal weakness and weakness.  Endo/Heme/Allergies:  Bruises/bleeds easily.  Psychiatric/Behavioral: Negative.    All other systems reviewed and are negative.    Past Medical History:  Diagnosis Date   Renal stones    Thyroid  disease     Past Surgical History:  Procedure Laterality Date   ABDOMINAL HYSTERECTOMY     APPENDECTOMY     CHOLECYSTECTOMY     Family History  Problem Relation Age of Onset   Breast cancer Daughter    Heart disease Mother 75   Heart disease Father 12    Social History:  reports that she has never smoked. She has never used smokeless tobacco. She reports that she does not drink alcohol  and does not use drugs.   Allergies  Allergen Reactions   Morphine And Codeine Nausea Only and Other (See Comments)    Extreme headaches  Reactions occur with both codeine and morphine.    Medications Prior to Admission  Medication Sig Dispense Refill   apixaban  (ELIQUIS ) 5 MG TABS tablet  Take 1 tablet (5 mg total) by mouth 2 (two) times daily.     Cholecalciferol  (VITAMIN D -3 PO) Take 1 capsule by mouth daily.     levothyroxine  (SYNTHROID ) 150 MCG tablet Take 1 tablet (150 mcg total) by mouth daily before breakfast. (Patient taking differently: Take 150 mcg by mouth at bedtime.) 90 tablet 3   nitroGLYCERIN  (NITROSTAT ) 0.4 MG SL tablet Place 1 tablet (0.4 mg total) under the tongue every 5 (five) minutes as needed for chest pain. (Patient not taking: Reported on 11/04/2024) 25 tablet 5   OVER THE COUNTER MEDICATION Take 1 capsule by mouth daily. OTC; CardioVibe     OVER THE COUNTER MEDICATION  Take 1 tablet by mouth daily. OTC; Cardio-D     OVER THE COUNTER MEDICATION Take 1 tablet by mouth daily. OTC; Lung Support.     OVER THE COUNTER MEDICATION Take 1 capsule by mouth daily. OTC; Urinary Maintenance.     OVER THE COUNTER MEDICATION Take 1 capsule by mouth daily. OTC; Immune Protect.     OVER THE COUNTER MEDICATION Take 1 capsule by mouth daily. OTC; Immune-5.     OVER THE COUNTER MEDICATION Take 1 capsule by mouth daily. OTC; Breathe Ease.     OVER THE COUNTER MEDICATION Take 1 capsule by mouth daily. OTC; Liver Support.     OVER THE COUNTER MEDICATION Take 1 capsule by mouth daily. OTC; Liver Detox.     [START ON 11/10/2024] rosuvastatin  (CRESTOR ) 20 MG tablet Take 1 tablet (20 mg total) by mouth daily.        Home: Home Living Family/patient expects to be discharged to:: Private residence Living Arrangements: Alone   Functional History:    Functional Status:  Mobility:          ADL:    Cognition: Cognition Orientation Level: Oriented X4     Blood pressure 115/61, pulse 75, temperature 98.5 F (36.9 C), temperature source Oral, resp. rate 18, height 5' 4 (1.626 m), weight 108.7 kg, SpO2 97%. Physical Exam Vitals and nursing note reviewed.  Constitutional:      Appearance: Normal appearance. She is obese.     Comments: Pt sitting up in bed- awake, alert, appropriate, NAD  HENT:     Head: Normocephalic and atraumatic.     Comments: Mild L facial droop Tongue midline Facial sensation questionably less on L- she reports it's intermittent    Right Ear: External ear normal.     Left Ear: External ear normal.     Nose: Nose normal. No congestion.     Mouth/Throat:     Mouth: Mucous membranes are dry.     Pharynx: No oropharyngeal exudate.  Eyes:     General:        Right eye: No discharge.        Left eye: No discharge.     Extraocular Movements: Extraocular movements intact.     Comments: No nystagmus  Skin:    General: Skin is  warm and dry.     Findings: Bruising present.     Comments: A lot of large purple bruising in Ue's- B/L from IV sticks and blood draws Midline R upper arm  Neurological:     Mental Status: She is alert.     Comments: Ox3 Questionable mild sensory deficit of L side of face No hoffman's Intact to light touch in x4 extremities No increased tone or clonus Intact naming and repetition   Psychiatric:  Mood and Affect: Mood normal.        Behavior: Behavior normal.     Results for orders placed or performed during the hospital encounter of 11/09/24 (from the past 48 hours)  Basic metabolic panel     Status: Abnormal   Collection Time: 11/09/24  2:21 PM  Result Value Ref Range   Sodium 134 (L) 135 - 145 mmol/L   Potassium 4.7 3.5 - 5.1 mmol/L   Chloride 99 98 - 111 mmol/L   CO2 27 22 - 32 mmol/L   Glucose, Bld 132 (H) 70 - 99 mg/dL    Comment: Glucose reference range applies only to samples taken after fasting for at least 8 hours.   BUN 18 8 - 23 mg/dL   Creatinine, Ser 9.11 0.44 - 1.00 mg/dL   Calcium  8.9 8.9 - 10.3 mg/dL   GFR, Estimated >39 >39 mL/min    Comment: (NOTE) Calculated using the CKD-EPI Creatinine Equation (2021)    Anion gap 8 5 - 15    Comment: Performed at [facility], [address], [place], [place] [address]  Urinalysis, w/ Reflex to Culture (Infection Suspected) -Urine, Clean Catch     Status: Abnormal   Collection Time: 11/09/24  2:49 PM  Result Value Ref Range   Specimen Source URINE, CLEAN CATCH    Color, Urine YELLOW YELLOW   APPearance CLEAR CLEAR   Specific Gravity, Urine 1.010 1.005 - 1.030   pH 6.0 5.0 - 8.0   Glucose, UA NEGATIVE NEGATIVE mg/dL   Hgb urine dipstick NEGATIVE NEGATIVE   Bilirubin Urine NEGATIVE NEGATIVE   Ketones, ur NEGATIVE NEGATIVE mg/dL   Protein, ur NEGATIVE NEGATIVE mg/dL   Nitrite POSITIVE (A) NEGATIVE   Leukocytes,Ua SMALL (A) NEGATIVE   RBC / HPF 0-5 0 - 5 RBC/hpf   WBC, UA 6-10 0 - 5 WBC/hpf     Comment:        Reflex urine culture not performed if WBC <=10, OR if Squamous epithelial cells >5. If Squamous epithelial cells >5 suggest recollection.    Bacteria, UA MANY (A) NONE SEEN   Squamous Epithelial / HPF 0-5 0 - 5 /HPF    Comment: Performed at [facility], [address], [place], [place] [address]  Glucose, capillary     Status: Abnormal   Collection Time: 11/09/24  4:49 PM  Result Value Ref Range   Glucose-Capillary 159 (H) 70 - 99 mg/dL    Comment: Glucose reference range applies only to samples taken after fasting for at least 8 hours.   No results found.    Blood pressure 115/61, pulse 75, temperature 98.5 F (36.9 C), temperature source Oral, resp. rate 18, height 5' 4 (1.626 m), weight 108.7 kg, SpO2 97%.  Medical Problem List and Plan: 1. 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  Antithrombotics: -DVT/anticoagulation:  Pharmaceutical: Eliquis - while in hospital- pt doesn't want vs cannot afford to get Medicare D to cover it  -antiplatelet therapy: N/A 3. Pain Management: LCSW to follow for evaluation and support.  4. Mood/Behavior/Sleep: N/A  --Melatonin prn for insomnia.   -antipsychotic agents: N/A 5. Neuropsych/cognition: This patient is capable of making decisions on her own behalf. 6. Skin/Wound Care: Routine pressure relief measures.  7. Fluids/Electrolytes/Nutrition: Monitor I/O. Check CMET in am 8. T2DM: Hgb A1c-6.8 and diet controlled? No meds due to  lack of insurance? --FBS 120-130. Will monitor BS ac/hs for trend and use SSI for elevated BS 9. Class III obesity: Educated on appropriate diet and exercise to help promote mobility and health.  --change diet  to heart healthy. 10.  A fib: Monitor HR TID 11. Hypothyroid: On synthroid  for supplement. 12.  Frequency: Will check UA reflex to culture. Monitor voiding  with PVR checks.  --encourage water intake. 13. Hyperlipidemia: Chol- 216. LDL-151. Now on Crestor .  14. Low ionized calcium : Will add caltrate.         Lennie Vasco, MD 11/09/2024  I have personally performed a face to face diagnostic evaluation of this patient and formulated the key components of the plan.  Additionally, I have personally reviewed laboratory data, imaging studies, as well as relevant notes and concur with the physician assistant's documentation above.   The patient's status has not changed from the original H&P.  Any changes in documentation from the acute care chart have been noted above.

## 2024-11-09 NOTE — Progress Notes (Signed)
 Inpatient Rehab Admissions Coordinator:    I have insurance approval and a bed available for pt to admit to CIR today. Dr. Rosemarie in agreement and [facility] aware.  I will notify pt/family and make arrangements.    Reche Lowers, PT, DPT Admissions Coordinator [phone] 11/09/24 10:22 AM

## 2024-11-09 NOTE — Progress Notes (Signed)
 Cornelio Bouchard, MD  Physician Physical Medicine and Rehabilitation   PMR Pre-admission    Signed   Date of Service: 11/09/2024 10:26 AM  Related encounter: ED to Hosp-Admission (Discharged) from 11/04/2024 in [facility]   Signed     Expand All Collapse All  PMR Admission Coordinator Pre-Admission Assessment   Patient: Abigail Ryan is an 78 y.o., female MRN: [id number] DOB: Mar 20, 1946 Height: 5' 3 (160 cm) Weight: 114.6 kg   Insurance Information HMO: yes    PPO:      PCP:      IPA:      80/20:      OTHER:  PRIMARY: [employer] Medicare      Policy#: [id number]      Subscriber: patient CM Name: Rosaline      Phone#:      Fax#: [phone] Pre-Cert#: [id number] auth for CIR from [place] with [facility] for admit 12/2 with next review date 12/8.  Updates due to CM at fax listed above.     Employer:  Benefits:  Phone #: [email or web address]     Name:  Eff. Date: 12/10/23-12/08/24     Deduct: $o (does nto have deductible)      Out of Pocket Max: $7,900 [phone] met)      Life Max: NA CIR: $475/day co-pay for days 1-5, 100% coverage for days 6+      SNF: 100% coverage for days 1-20, 4203 co-pay/day for days 21-100 Outpatient: $35/visit co-pay     Co-Pay:  Home Health: 100% coverage      Co-Pay:  DME: 80% coverage     Co-Pay: 20% co-insurance Providers: in-network SECONDARY:       Policy#:      Phone#:    Artist:       Phone#:    The Data Processing Manager" for patients in Inpatient Rehabilitation Facilities with attached "Privacy Act Statement-Health Care Records" was provided and verbally reviewed with: Patient   Emergency Contact Information Contact Information       Name Relation Home Work Mobile    O'Daniel,Jamie Daughter [phone]         Other Contacts   None on File        Current Medical History  Patient Admitting Diagnosis: CVA History of Present Illness: Pt is a 78 year old female with  medical hx significant for: HTN, hyperlipidemia, diabetes, A-fib, Bell's palsy.  Pt presented to [facility] d/t acute onset of dizziness and difficulty moving left side. Pt presented as code stroke. CT negative for acute abnormality. TNK administered. MRA negative for LVO. MRI showed acute ischemic stroke in right basal ganglia/thalamus. Pt recommended for Eliquis , states she will take it in the acute setting but not once discharged, she prefers to take supplements.  Therapy evaluations completed and CIR recommended d/t pt's deficits in functional mobility. Complete NIHSS TOTAL: 2   Patient's medical record from [facility] has been reviewed by the rehabilitation admission coordinator and physician.   Past Medical History      Past Medical History:  Diagnosis Date   Renal stones     Thyroid  disease            Has the patient had major surgery during 100 days prior to admission? No   Family History   family history includes Breast cancer in her daughter; Heart disease (age of onset: 51) in her father; Heart disease (age of onset: 98) in her mother.  Current Medications  Current Medications    Current Facility-Administered Medications:    acetaminophen  (TYLENOL ) tablet 650 mg, 650 mg, Oral, Q4H PRN **OR** acetaminophen  (TYLENOL ) 160 MG/5ML solution 650 mg, 650 mg, Per Tube, Q4H PRN **OR** acetaminophen  (TYLENOL ) suppository 650 mg, 650 mg, Rectal, Q4H PRN, de Clint Kill, Cortney E, NP   apixaban  (ELIQUIS ) tablet 5 mg, 5 mg, Oral, BID, Jerri Pfeiffer, MD, 5 mg at 11/08/24 9050   Chlorhexidine  Gluconate Cloth 2 % PADS 6 each, 6 each, Topical, Daily, Palikh, Gaurang M, MD, 6 each at 11/08/24 1000   labetalol  (NORMODYNE ) injection 10 mg, 10 mg, Intravenous, Q2H PRN, de Clint Kill, Cortney E, NP   levothyroxine  (SYNTHROID ) tablet 150 mcg, 150 mcg, Oral, Q0600, Waddell Aquas A, NP, 150 mcg at 11/08/24 9388   melatonin tablet 3 mg, 3 mg, Oral, QHS, Arora, Ashish, MD, 3 mg at 11/07/24  2145   ondansetron  (ZOFRAN ) injection 4 mg, 4 mg, Intravenous, Q6H PRN, Jerri Pfeiffer, MD   pantoprazole  (PROTONIX ) injection 40 mg, 40 mg, Intravenous, QHS, de La Torre, [place] E, NP, 40 mg at 11/05/24 2124   rosuvastatin  (CRESTOR ) tablet 20 mg, 20 mg, Oral, Daily, Jerri Pfeiffer, MD, 20 mg at 11/08/24 0949   senna-docusate (Senokot-S) tablet 1 tablet, 1 tablet, Oral, QHS PRN, de Clint Kill, Cortney E, NP, 1 tablet at 11/07/24 0446   sodium chloride  flush (NS) 0.9 % injection 10-40 mL, 10-40 mL, Intracatheter, PRN, Patsey Lot, MD     Patients Current Diet:  Diet Order                  Diet Heart Room service appropriate? Yes with Assist; Fluid consistency: Thin  Diet effective now                         Precautions / Restrictions Precautions Precautions: Fall Restrictions Weight Bearing Restrictions Per Provider Order: No    Has the patient had 2 or more falls or a fall with injury in the past year? No   Prior Activity Level Community (5-7x/wk): drives, gets out of house frequently   Prior Functional Level Self Care: Did the patient need help bathing, dressing, using the toilet or eating? Independent   Indoor Mobility: Did the patient need assistance with walking from room to room (with or without device)? Independent   Stairs: Did the patient need assistance with internal or external stairs (with or without device)? Independent   Functional Cognition: Did the patient need help planning regular tasks such as shopping or remembering to take medications? Independent   Patient Information Are you of Hispanic, Latino/a,or Spanish origin?: A. No, not of Hispanic, Latino/a, or Spanish origin What is your race?: A. White Do you need or want an interpreter to communicate with a doctor or health care staff?: 0. No   Patient's Response To:  Health Literacy and Transportation Is the patient able to respond to health literacy and transportation needs?: Yes Health Literacy -  How often do you need to have someone help you when you read instructions, pamphlets, or other written material from your doctor or pharmacy?: Never In the past 12 months, has lack of transportation kept you from medical appointments or from getting medications?: No In the past 12 months, has lack of transportation kept you from meetings, work, or from getting things needed for daily living?: No   Home Assistive Devices / Equipment Home Equipment: [place] - single point (has access to other equipment  if needed (her daughters))   Prior Device Use: Indicate devices/aids used by the patient prior to current illness, exacerbation or injury? Cane (used occasionally)   Current Functional Level Cognition   Arousal/Alertness: Awake/alert Overall Cognitive Status: Within Functional Limits for tasks assessed Orientation Level: Oriented X4 Attention: Selective Selective Attention: Appears intact Memory: Appears intact Awareness: Appears intact Problem Solving: Appears intact Executive Function: Reasoning Reasoning: Appears intact    Extremity Assessment (includes Sensation/Coordination)   Upper Extremity Assessment: Defer to OT evaluation LUE Deficits / Details: gross grasp is 3+/5 MMT, elbow flexion is 3/5, shoulder is 2/5 MMT. Denies sensation changes. Unable to reach to top of head. Able to hold arm out against gravity for 3 seconds prior to drift. propriception testing was [facility] however pt had a difficlut time using the arm functionally LUE Sensation: WNL, decreased proprioception LUE Coordination: decreased gross motor, decreased fine motor  Lower Extremity Assessment: LLE deficits/detail LLE Deficits / Details: 4/5 upon MMT assessment, functionally presenting weaker with L knee buckling and hyperextension. 10/10 correct proprioception of great toe, however anticipate some proprioceptive deficit contributing to buckling. heel-to-shin WFL LLE Sensation: decreased proprioception LLE Coordination:  WNL     ADLs   Overall ADL's : Needs assistance/impaired Eating/Feeding: Set up Grooming: Wash/dry hands, Wash/dry face, Oral care, Brushing hair, Set up, Sitting Grooming Details (indicate cue type and reason): in recliner Upper Body Bathing: Minimal assistance Lower Body Bathing: Moderate assistance, Sit to/from stand Upper Body Dressing : Minimal assistance Lower Body Dressing: Maximal assistance Lower Body Dressing Details (indicate cue type and reason): patient unable to doff or donn socks Toilet Transfer: Moderate assistance, Rolling walker (2 wheels) Toileting- Clothing Manipulation and Hygiene: Moderate assistance, Sitting/lateral lean, Sit to/from stand Functional mobility during ADLs: Minimal assistance, +2 for physical assistance, +2 for safety/equipment General ADL Comments: LUE weakness but strength increasing     Mobility   Overal bed mobility: Needs Assistance Bed Mobility: Supine to Sit, Sit to Supine Supine to sit: Mod assist, HOB elevated, Used rails Sit to supine: Mod assist, Used rails General bed mobility comments: Pt initially advancing BLE to L side of the bed, then to the R due to line constraints. Pt demonstrates increased difficulty sitting to R side due to L shoulder pain/ decreased ROM. Assist for BLE elevation back to EOB     Transfers   Overall transfer level: Needs assistance Equipment used: Rolling walker (2 wheels) Transfers: Sit to/from Stand, Bed to chair/wheelchair/BSC Sit to Stand: Mod assist Bed to/from chair/wheelchair/BSC transfer type:: Step pivot Step pivot transfers: Min assist General transfer comment: STS from EOB and recliner with mod A for power up and pt demonstrating good carryover of hand placement from previous session. Pivot to recliner and back to EOB with assist for L knee blocking and cues for sequencing     Ambulation / Gait / Stairs / Wheelchair Mobility   Ambulation/Gait Ambulation/Gait assistance: Editor, Commissioning  (Feet): 4 Feet Assistive device: Rolling walker (2 wheels) Gait Pattern/deviations: Step-to pattern, Decreased step length - left, Decreased stance time - left, Knee hyperextension - left, Decreased step length - right, Decreased stride length, Decreased weight shift to left General Gait Details: Pt demonstrates L knee instability with hyperextension, but no overt buckling noted. Pt able to advance LLE, but requires cues for increased B step length Gait velocity: decr Pre-gait activities: standing marches x7 bilat in standing     Posture / Balance Dynamic Sitting Balance Sitting balance - Comments: EOB Balance Overall balance  assessment: Needs assistance Sitting-balance support: Feet supported Sitting balance-Leahy Scale: Good Sitting balance - Comments: EOB Standing balance support: Bilateral upper extremity supported, During functional activity Standing balance-Leahy Scale: Poor Standing balance comment: reliant on BUE support when standing     Special considerations/life events  Skin Erythema/Redness: arm/left; Ecchymosis: arm/bilateral, upper, lower and External Urinary Catheter    Previous Home Environment (from acute therapy documentation) Living Arrangements: Alone  Lives With: Alone Available Help at Discharge: Family, Friend(s), Available 24 hours/day Type of Home: House Home Layout: One level Home Access: Stairs to enter Entrance Stairs-Rails: Right, Left Entrance Stairs-Number of Steps: 3 (son suppsed to build a ramp at 1 entrance) Bathroom Shower/Tub: Engineer, Manufacturing Systems: Standard (does have one handicapped toilet) Bathroom Accessibility: Yes How Accessible: Accessible via walker Home Care Services: No   Discharge Living Setting Plans for Discharge Living Setting: Patient's home Type of Home at Discharge: House Discharge Home Layout: One level Discharge Home Access: Stairs to enter Entrance Stairs-Rails: Right, Left Entrance Stairs-Number of Steps:  3 Discharge Bathroom Shower/Tub: Tub/shower unit Discharge Bathroom Toilet: Standard (does have one handicapped toilet) Discharge Bathroom Accessibility: Yes How Accessible: Accessible via walker Does the patient have any problems obtaining your medications?: No   Social/Family/Support Systems Anticipated Caregiver: Ashla Murph (daughter) and other family Anticipated Caregiver's Contact Information: Jayvon Mounger: [phone] Caregiver Availability: 24/7 Discharge Plan Discussed with Primary Caregiver: Yes Is Caregiver In Agreement with Plan?: Yes Does Caregiver/Family have Issues with Lodging/Transportation while Pt is in Rehab?: No   Goals Patient/Family Goal for Rehab: Supervision:PT/OT Expected length of stay: 10 days Pt/Family Agrees to Admission and willing to participate: Yes Program Orientation Provided & Reviewed with Pt/Caregiver Including Roles  & Responsibilities: Yes   Decrease burden of Care through IP rehab admission: NA   Possible need for SNF placement upon discharge: Not anticipated   Patient Condition: I have reviewed medical records from [facility], spoken with [facility] team, and patient and daughter. I met with patient at the bedside for inpatient rehabilitation assessment.  Patient will benefit from ongoing PT and OT, can actively participate in 3 hours of therapy a day 5 days of the week, and can make measurable gains during the admission.  Patient will also benefit from the coordinated team approach during an Inpatient Acute Rehabilitation admission.  The patient will receive intensive therapy as well as Rehabilitation physician, nursing, social worker, and care management interventions.  Due to safety, skin/wound care, disease management, medication administration, pain management, and patient education the patient requires 24 hour a day rehabilitation nursing.  The patient is currently min/mod assist with mobility and basic ADLs.  Discharge setting and therapy post discharge at home  with home health is anticipated.  Patient has agreed to participate in the Acute Inpatient Rehabilitation Program and will admit today.   Preadmission Screen Completed By: Tinnie SHAUNNA Yvone Delayne, with day of admit updates by Reche Lowers, PT, DPT 11/08/2024 2:58 PM ______________________________________________________________________   Discussed status with Dr. Lovorn on 11/09/24  at 10:26 AM  and received approval for admission today.   Admission Coordinator: Reche Lowers, PT, DPT and Lauren SHAUNNA Yvone Delayne, CCC-SLP, time 10:26 AM Pattricia 11/09/24     Assessment/Plan: Diagnosis: R Internal capsule stroke s/p TNK Does the need for close, 24 hr/day Medical supervision in concert with the patient's rehab needs make it unreasonable for this patient to be served in a less intensive setting? Yes Co-Morbidities requiring supervision/potential complications: CHF, DM- A1c 6.8; HTN, HLD; Class III morbid obesity;  BMI 44.75; L shoulder pain; hx of L bell's palsy Due to bladder management, bowel management, safety, skin/wound care, disease management, medication administration, and patient education, does the patient require 24 hr/day rehab nursing? Yes Does the patient require coordinated care of a physician, rehab nurse, PT, OT,  to address physical and functional deficits in the context of the above medical diagnosis(es)? Yes Addressing deficits in the following areas: balance, endurance, locomotion, strength, transferring, bowel/bladder control, bathing, dressing, feeding, grooming, and toileting Can the patient actively participate in an intensive therapy program of at least 3 hrs of therapy 5 days a week? Yes The potential for patient to make measurable gains while on inpatient rehab is good Anticipated functional outcomes upon discharge from inpatient rehab: supervision PT, supervision OT, n/a SLP Estimated rehab length of stay to reach the above functional goals is: 10-12 days Anticipated discharge  destination: Home 10. Overall Rehab/Functional Prognosis: good     MD Signature:            Revision History  Date/Time User Provider Type Action  11/09/2024 10:43 AM Cornelio Bouchard, MD Physician Sign  11/09/2024 10:26 AM Butler Reche BRAVO, PT Rehab Admission Coordinator Share  11/08/2024  2:58 PM Yvone Delayne Tinnie SHAUNNA, CCC-SLP Rehab Admission Coordinator Share   View Details Report

## 2024-11-09 NOTE — Discharge Instructions (Signed)

## 2024-11-09 NOTE — Progress Notes (Signed)
 Pt arrived to 4W08, a/ox4, vitals stable.

## 2024-11-09 NOTE — Discharge Summary (Addendum)
 Stroke Discharge Summary  Patient ID: Abigail Ryan   MRN: [id number]      DOB: 10/17/1946  Date of Admission: 11/04/2024 Date of Discharge: 11/09/2024  Attending Physician:  Stroke, Md, MD Patient's PCP:  Loring Tanda Mae, MD  Discharge Diagnoses: Acute Ischemic Infarct:  right internal capsule s/p TNK Etiology: Cardioembolic in the setting of paroxysmal A-fib not on anticoagulation Principal Problem:   Acute ischemic stroke [facility]) Left hemiparesis Active Problems:   Hypothyroid   Atrial fibrillation with RVR (HCC)   Acute CVA (cerebrovascular accident) (HCC)   Mixed hyperlipidemia   Chronic heart failure with preserved ejection fraction (HCC)   Paroxysmal A-fib (HCC)   Primary hypertension   Medications to be continued on Rehab Allergies as of 11/09/2024       Reactions   Morphine And Codeine Nausea Only, Other (See Comments)   Extreme headaches Reactions occur with both codeine and morphine.        Medication List     STOP taking these medications    BC HEADACHE POWDER PO   CALCIUM  PO   KRILL OIL OMEGA-3 PO       TAKE these medications    apixaban  5 MG Tabs tablet Commonly known as: ELIQUIS  Take 1 tablet (5 mg total) by mouth 2 (two) times daily.   levothyroxine  150 MCG tablet Commonly known as: SYNTHROID  Take 1 tablet (150 mcg total) by mouth daily before breakfast. What changed: when to take this   nitroGLYCERIN  0.4 MG SL tablet Commonly known as: NITROSTAT  Place 1 tablet (0.4 mg total) under the tongue every 5 (five) minutes as needed for chest pain.   OVER THE COUNTER MEDICATION Take 1 capsule by mouth daily. OTC; CardioVibe   OVER THE COUNTER MEDICATION Take 1 tablet by mouth daily. OTC; Cardio-D   OVER THE COUNTER MEDICATION Take 1 tablet by mouth daily. OTC; Lung Support.   OVER THE COUNTER MEDICATION Take 1 capsule by mouth daily. OTC; Urinary Maintenance.   OVER THE COUNTER MEDICATION Take 1 capsule by mouth  daily. OTC; Immune Protect.   OVER THE COUNTER MEDICATION Take 1 capsule by mouth daily. OTC; Immune-5.   OVER THE COUNTER MEDICATION Take 1 capsule by mouth daily. OTC; Breathe Ease.   OVER THE COUNTER MEDICATION Take 1 capsule by mouth daily. OTC; Liver Support.   OVER THE COUNTER MEDICATION Take 1 capsule by mouth daily. OTC; Liver Detox.   rosuvastatin  20 MG tablet Commonly known as: CRESTOR  Take 1 tablet (20 mg total) by mouth daily. Start taking on: November 10, 2024   VITAMIN D -3 PO Take 1 capsule by mouth daily.        LABORATORY STUDIES CBC    Component Value Date/Time   WBC 7.9 11/05/2024 0400   RBC 4.17 11/05/2024 0400   HGB 12.3 11/05/2024 0400   HGB 12.7 02/20/2024 1222   HCT 36.7 11/05/2024 0400   HCT 39.2 02/20/2024 1222   PLT 198 11/05/2024 0400   PLT 258 02/20/2024 1222   MCV 88.0 11/05/2024 0400   MCV 92 02/20/2024 1222   MCH 29.5 11/05/2024 0400   MCHC 33.5 11/05/2024 0400   RDW 13.9 11/05/2024 0400   RDW 13.1 02/20/2024 1222   LYMPHSABS 1.0 11/04/2024 1110   MONOABS 0.8 11/04/2024 1110   EOSABS 0.1 11/04/2024 1110   BASOSABS 0.0 11/04/2024 1110   CMP    Component Value Date/Time   NA 137 11/05/2024 0400   NA 141 02/20/2024 1221  K 4.0 11/05/2024 0400   CL 102 11/05/2024 0400   CO2 25 11/05/2024 0400   GLUCOSE 139 (H) 11/05/2024 0400   BUN 13 11/05/2024 0400   BUN 11 02/20/2024 1221   CREATININE 0.82 11/05/2024 0400   CALCIUM  8.5 (L) 11/05/2024 0400   PROT 5.9 (L) 11/05/2024 0400   ALBUMIN 3.1 (L) 11/05/2024 0400   AST 20 11/05/2024 0400   ALT 16 11/05/2024 0400   ALKPHOS 67 11/05/2024 0400   BILITOT 0.6 11/05/2024 0400   GFRNONAA >60 11/05/2024 0400   GFRAA >60 10/20/2019 1321   COAGS Lab Results  Component Value Date   INR 1.1 11/04/2024   INR 1.1 02/18/2019   INR 1.45 01/27/2019   Lipid Panel    Component Value Date/Time   CHOL 216 (H) 11/05/2024 0400   CHOL 244 (H) 02/20/2024 1220   TRIG 57 11/05/2024  0400   HDL 54 11/05/2024 0400   HDL 54 02/20/2024 1220   CHOLHDL 4.0 11/05/2024 0400   VLDL 11 11/05/2024 0400   LDLCALC 151 (H) 11/05/2024 0400   LDLCALC 175 (H) 02/20/2024 1220   HgbA1C  Lab Results  Component Value Date   HGBA1C 6.8 (H) 11/05/2024   Urinalysis    Component Value Date/Time   COLORURINE YELLOW 11/07/2024 0418   APPEARANCEUR CLEAR 11/07/2024 0418   LABSPEC >1.030 (H) 11/07/2024 0418   PHURINE 6.0 11/07/2024 0418   GLUCOSEU NEGATIVE 11/07/2024 0418   HGBUR NEGATIVE 11/07/2024 0418   BILIRUBINUR NEGATIVE 11/07/2024 0418   KETONESUR NEGATIVE 11/07/2024 0418   PROTEINUR NEGATIVE 11/07/2024 0418   UROBILINOGEN 0.2 08/15/2015 2223   NITRITE POSITIVE (A) 11/07/2024 0418   LEUKOCYTESUR NEGATIVE 11/07/2024 0418   Urine Drug Screen     Component Value Date/Time   LABOPIA POSITIVE (A) 02/18/2019 1630   COCAINSCRNUR NONE DETECTED 02/18/2019 1630   LABBENZ NONE DETECTED 02/18/2019 1630   AMPHETMU NONE DETECTED 02/18/2019 1630   THCU NONE DETECTED 02/18/2019 1630   LABBARB NONE DETECTED 02/18/2019 1630    Alcohol  Level    Component Value Date/Time   [facility] <15 11/04/2024 1110     SIGNIFICANT DIAGNOSTIC STUDIES DG Shoulder Left Port Result Date: 11/06/2024 CLINICAL DATA:  Shoulder pain EXAM: LEFT SHOULDER COMPARISON:  None Available. FINDINGS: There is no evidence of fracture or dislocation. 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 Soft tissues are unremarkable. 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 Electronically Signed   By: Limin  Xu M.D.   On: 11/06/2024 19:05   CT HEAD WO CONTRAST ( ) Result Date: 11/05/2024 EXAM: CT HEAD WITHOUT 11/05/2024 08:18:51 AM TECHNIQUE: CT of the head was performed without the administration of intravenous contrast. Automated exposure control, iterative reconstruction, and/or weight based adjustment of the mA/kV was utilized to reduce the  radiation dose to as low as reasonably achievable. COMPARISON: 11/04/2024 CLINICAL HISTORY: Stroke, follow up FINDINGS: BRAIN AND VENTRICLES: No acute intracranial hemorrhage. No mass effect or midline shift. No extra-axial fluid collection. There is an evolving non hemorrhagic infarct present within the posterior limb of the right internal capsule and right coronal radiata. There is a prominent perivascular space or small chronic acute infarct within the left basal ganglia. No hydrocephalus. ORBITS: Bilateral lens replacement noted. SINUSES AND MASTOIDS: Polypoid mucosal thickening in right maxillary sinus. SOFT TISSUES AND SKULL: No acute skull fracture. No acute soft tissue abnormality. IMPRESSION: 1. Evolving non-hemorrhagic infarct in the posterior limb of the right internal capsule and right corona radiata. 2.  Prominent perivascular space or small chronic acute infarct in the left basal ganglia. Electronically signed by: Evalene Coho MD 11/05/2024 08:46 AM EST RP Workstation: [id number]   ECHOCARDIOGRAM COMPLETE Result Date: 11/04/2024    ECHOCARDIOGRAM REPORT   Patient Name:   Abigail Ryan Date of Exam: 11/04/2024 Medical Rec #:  [id number]     Height:       63.0 in Accession #:    [id number]    Weight:       252.6 lb Date of Birth:  [birth year]     BSA:          2.136 m Patient Age:    78 years      BP:           146/74 mmHg Patient Gender: F             HR:           75 bpm. Exam Location:  Inpatient Procedure: 2D Echo, Cardiac Doppler, Color Doppler and Intracardiac            Opacification Agent (Both Spectral and Color Flow Doppler were            utilized during procedure). Indications:    Stroke l63.9  History:        Patient has prior history of Echocardiogram examinations, most                 recent 01/26/2019. Arrythmias:Atrial Fibrillation; Risk                 Factors:Hypertension, Diabetes and Dyslipidemia.  Sonographer:    Aida Pizza RCS Referring Phys: [id number] CORTNEY E DE LA TORRE  IMPRESSIONS  1. Left ventricular ejection fraction, by estimation, is 70 to 75%. The left ventricle has hyperdynamic function. The left ventricle has no regional wall motion abnormalities. There is mild concentric left ventricular hypertrophy. Left ventricular diastolic parameters are consistent with Grade I diastolic dysfunction (impaired relaxation).  2. Right ventricular systolic function is normal. The right ventricular size is normal.  3. Left atrial size was mildly dilated.  4. The mitral valve is normal in structure. Trivial mitral valve regurgitation. No evidence of mitral stenosis.  5. The aortic valve is tricuspid. There is mild calcification of the aortic valve. Aortic valve regurgitation is not visualized. Aortic valve sclerosis/calcification is present, without any evidence of aortic stenosis.  6. The inferior vena cava is normal in size with greater than 50% respiratory variability, suggesting right atrial pressure of 3 mmHg. FINDINGS  Left Ventricle: Left ventricular ejection fraction, by estimation, is 70 to 75%. The left ventricle has hyperdynamic function. The left ventricle has no regional wall motion abnormalities. Definity  contrast agent was given IV to delineate the left ventricular endocardial borders. The left ventricular internal cavity size was normal in size. There is mild concentric left ventricular hypertrophy. Left ventricular diastolic parameters are consistent with Grade I diastolic dysfunction (impaired relaxation). Right Ventricle: The right ventricular size is normal. No increase in right ventricular wall thickness. Right ventricular systolic function is normal. Left Atrium: Left atrial size was mildly dilated. Right Atrium: Right atrial size was normal in size. Pericardium: There is no evidence of pericardial effusion. Mitral Valve: The mitral valve is normal in structure. Mild mitral annular calcification. Trivial mitral valve regurgitation. No evidence of mitral valve stenosis.  Tricuspid Valve: The tricuspid valve is normal in structure. Tricuspid valve regurgitation is not demonstrated. No evidence of tricuspid stenosis. Aortic Valve: The aortic valve is  tricuspid. There is mild calcification of the aortic valve. Aortic valve regurgitation is not visualized. Aortic valve sclerosis/calcification is present, without any evidence of aortic stenosis. Aortic valve mean gradient measures 6.0 mmHg. Aortic valve peak gradient measures 12.5 mmHg. Aortic valve area, by VTI measures 2.48 cm. Pulmonic Valve: The pulmonic valve was normal in structure. Pulmonic valve regurgitation is not visualized. No evidence of pulmonic stenosis. Aorta: The aortic root is normal in size and structure. Venous: The inferior vena cava is normal in size with greater than 50% respiratory variability, suggesting right atrial pressure of 3 mmHg. IAS/Shunts: No atrial level shunt detected by color flow Doppler.  LEFT VENTRICLE PLAX 2D LVIDd:         3.60 cm   Diastology LVIDs:         2.50 cm   LV e' medial:    6.53 cm/s LV PW:         1.10 cm   LV E/e' medial:  14.7 LV IVS:        1.10 cm   LV e' lateral:   7.94 cm/s LVOT diam:     2.00 cm   LV E/e' lateral: 12.1 LV SV:         81 LV SV Index:   38 LVOT Area:     3.14 cm  RIGHT VENTRICLE RV S prime:     16.90 cm/s TAPSE (M-mode): 1.6 cm LEFT ATRIUM           Index        RIGHT ATRIUM           Index LA diam:      3.00 cm 1.40 cm/m   RA Area:     14.10 cm LA Vol (A2C): 42.3 ml 19.81 ml/m  RA Volume:   34.50 ml  16.16 ml/m LA Vol (A4C): 71.0 ml 33.25 ml/m  AORTIC VALVE AV Area (Vmax):    2.43 cm AV Area (Vmean):   2.28 cm AV Area (VTI):     2.48 cm AV Vmax:           177.00 cm/s AV Vmean:          111.000 cm/s AV VTI:            0.326 m AV Peak Grad:      12.5 mmHg AV Mean Grad:      6.0 mmHg LVOT Vmax:         137.00 cm/s LVOT Vmean:        80.500 cm/s LVOT VTI:          0.257 m LVOT/AV VTI ratio: 0.79  AORTA Ao Root diam: 3.50 cm MITRAL VALVE MV Area (PHT): 2.69  cm     SHUNTS MV Decel Time: 282 msec     Systemic VTI:  0.26 m MV E velocity: 96.20 cm/s   Systemic Diam: 2.00 cm MV A velocity: 112.00 cm/s MV E/A ratio:  0.86 Toribio Fuel MD Electronically signed by Toribio Fuel MD Signature Date/Time: 11/04/2024/1:22:43 PM    Final    MR BRAIN WO CONTRAST Result Date: 11/04/2024 EXAM: MRI BRAIN WITHOUT CONTRAST 11/04/2024 09:42:45 AM TECHNIQUE: Multiplanar multisequence MRI of the head/brain was performed without the administration of intravenous contrast. COMPARISON: MRI of the head dated 02/18/2019. CLINICAL HISTORY: Neuro deficit, acute, stroke suspected. FINDINGS: BRAIN AND VENTRICLES: There is a vague area of restricted diffusion present within the periventricular white matter and posterior limb of the right internal capsule, compatible with acute or  subacute small vessel non hemorrhagic infarct. There is mild-to-moderate periventricular and subcortical white matter disease. No intracranial hemorrhage. No mass. No midline shift. No hydrocephalus. The sella is unremarkable. Normal flow voids. ORBITS: No acute abnormality. SINUSES AND MASTOIDS: No acute abnormality. BONES AND SOFT TISSUES: Normal marrow signal. No acute soft tissue abnormality. IMPRESSION: 1. Vague area of restricted diffusion within the periventricular white matter and posterior limb of the right internal capsule, compatible with acute or subacute small vessel non-hemorrhagic infarct. 2. Mild-to-moderate periventricular and subcortical white matter disease. Electronically signed by: Evalene Coho MD 11/04/2024 10:17 AM EST RP Workstation: [id number]   MR ANGIO NECK W WO CONTRAST Result Date: 11/04/2024 EXAM: MRA Neck without and with contrast 11/04/2024 09:49:18 AM TECHNIQUE: Multiplanar multisequence MRA of the neck was performed without and with the administration of 10 mL gadobutrol  (GADAVIST ) 1 MMOL/ML injection. 2D and 3D reformatted images are provided for review. Stenosis of the  internal carotid arteries is measured using NASCET criteria. COMPARISON: None available CLINICAL HISTORY: neuro deficit, stroke suspected FINDINGS: CAROTID ARTERIES: No dissection. No hemodynamically significant stenosis by NASCET criteria. VERTEBRAL ARTERIES: The right vertebral artery is dominant and the left is diminutive, but patent throughout the cervical segments. The V4 segment of the left vertebral artery is markedly diminutive or severely stenotic. No dissection. IMPRESSION: 1. No hemodynamically significant stenosis or dissection of the neck arteries. 2. Right vertebral artery is dominant. Left vertebral artery is diminutive, with the V4 segment markedly diminutive or severely stenotic. Recommend correlation with intracranial circulation and clinical findings given severe narrowing of the left V4 segment. Electronically signed by: Evalene Coho MD 11/04/2024 10:13 AM EST RP Workstation: [id number]   MR ANGIO HEAD WO CONTRAST Result Date: 11/04/2024 EXAM: MR Angiography Head without intravenous Contrast. 11/04/2024 09:30:59 AM TECHNIQUE: Magnetic resonance angiography images of the head without intravenous contrast. Multiplanar 2D and 3D reformatted images are provided for review. COMPARISON: None provided. CLINICAL HISTORY: Neuro deficit, acute, stroke suspected. FINDINGS: ANTERIOR CIRCULATION: No significant stenosis of the internal carotid arteries. The left A1 segment is diminutive and appears to be diffusely irregular in contour. There is mild irregular stenosis of the right M1 segment, but no flow-limiting stenosis. No aneurysm. POSTERIOR CIRCULATION: No significant stenosis of the posterior cerebral arteries. No significant stenosis of the basilar artery. The right vertebral artery is dominant and the left is hypoplastic. The left V4 segment is diminutive or moderate-to-severely stenotic proximally. The left distal V4 segment is patent. No aneurysm. IMPRESSION: 1. Mild irregular stenosis of  the right M1 segment, but no flow-limiting stenosis. 2. Right vertebral artery is dominant and the left is hypoplastic. The left v4 segment is diminutive or moderate-to-severely stenotic proximally, with a patent distal segment. 3. Diminutive left a1 segment with diffuse irregular contour. Electronically signed by: Evalene Coho MD 11/04/2024 10:09 AM EST RP Workstation: [id number]   CT HEAD CODE STROKE WO CONTRAST Result Date: 11/04/2024 EXAM: CT HEAD WITHOUT 11/04/2024 08:21:56 AM TECHNIQUE: CT of the head was performed without the administration of intravenous contrast. Automated exposure control, iterative reconstruction, and/or weight based adjustment of the mA/kV was utilized to reduce the radiation dose to as low as reasonably achievable. COMPARISON: CT of the head dated 02/18/2019. CLINICAL HISTORY: Neuro deficit, acute, stroke suspected. FINDINGS: BRAIN AND VENTRICLES: No acute intracranial hemorrhage. No mass effect or midline shift. No extra-axial fluid collection. No evidence of acute infarct. No hydrocephalus. ORBITS: No acute abnormality. SINUSES AND MASTOIDS: The patient is status post right antrostomy. There is mucosal disease  within the right maxillary sinus. SOFT TISSUES AND SKULL: No acute skull fracture. No acute soft tissue abnormality. [place] Stroke Program Early CT Score (ASPECTS) ----- Ganglionic (Caudate, IC, Lentiform Nucleus, Insula, M1-M3): 7 Supraganglionic (M4-M6): 3 Total: 10 IMPRESSION: 1. No acute intracranial abnormality. 2. ASPECTS: 10. 3. The above findings were communicated to Dr. Nichola at 8:27 AM 11/04/24. Electronically signed by: Evalene Coho MD 11/04/2024 08:29 AM EST RP Workstation: [id number]       HISTORY OF PRESENT ILLNESS 78 y.o. female with history of hypertension, hyperlipidemia, diabetes, atrial fibrillation not on anticoagulation and Bell's palsy who presents with sudden onset left hemiparesis.  Patient was in her normal state of health earlier today  and suddenly developed left-sided weakness and fell over.  Received IV TNK NIH on Admission 4    HOSPITAL COURSE Acute Ischemic Infarct:  right internal capsule s/p TNK Etiology: Cardioembolic in the setting of paroxysmal A-fib not on anticoagulation Code Stroke CT head No acute abnormality. ASPECTS 10.    MRI: Vague area of restricted diffusion within the periventricular white matter and posterior limb of the right internal capsule, compatible with acute or subacute small vessel non-hemorrhagic infarct. MRA: Mild irregular stenosis of the right M1 segment, but no flow-limiting stenosis. Right vertebral artery is dominant and the left is hypoplastic. The left v4 segment is diminutive or moderate-to-severely stenotic proximally, with a patent distal segment. Diminutive left a1 segment with diffuse irregular contour. 24-hour CT scan no bleeding, infarct in the posterior limb of the right internal capsule and right CR 2D Echo EF 70 to 75%  LDL 151 HgbA1c 6.8 VTE prophylaxis -Eliquis  No antithrombotic prior to admission, continue Eliquis  Therapy recommendations:  CIR Disposition: Pending  Atrial fibrillation Home Meds: None Had episode during 2020 when she had sepsis. Was on Xarelto  Had rectal bleeding and given follow-up revealed no further atrial fibrillation so Xarelto  was discontinued 02/2024 followed with Dr. Petwardhan and Ziopatch 7 day no afib Continue Eliquis   Patient confirmed she is okay with taking Eliquis  while at rehab Continue telemetry monitoring   Hypertension Home meds: None Stable Long-term BP goal normotensive   Hyperlipidemia Home meds: None LDL 151, goal < 70 Continue Crestor  20 mg Continue statin at discharge  Diabetes type II Controlled Home meds: None HgbA1c 6.8 goal < 7.0   Hypothyroidism Continue home Synthroid  150 mcg   Left shoulder pain s/p fall   X-ray: Moderate degenerative changes of the acromioclavicular and glenohumeral joints. Continue PRN  Tylenol    Other Stroke Risk Factors  Obesity, Body mass index is 44.75 kg/m., BMI >/= 30 associated with increased stroke risk, recommend weight loss, diet and exercise as appropriate   Congestive heart failure   Other medical issues History of left Bell's palsy  DISCHARGE EXAM Blood pressure (!) 151/86, pulse 74, temperature 98.3 F (36.8 C), resp. rate 16, height 5' 3 (1.6 m), weight 114.6 kg, SpO2 94%. PHYSICAL EXAM General:  Alert, well-nourished, well-developed patient in no acute distress Psych:  Mood and affect appropriate for situation CV: Regular rate and rhythm on monitor Respiratory:  Regular, unlabored respirations on room air GI: Abdomen soft and nontender   NEURO:  Mental Status: AA&Ox3, patient is able to give clear and coherent history Speech/Language: speech is without dysarthria or aphasia.  Naming, repetition, fluency, and comprehension intact.   Cranial Nerves:  II: PERRL. Visual fields full.  III, IV, VI: EOMI. Eyelids elevate symmetrically.  V: Sensation is intact to light touch and symmetrical to face.  VII:  Face is symmetrical resting and smiling VIII: hearing intact to voice. IX, X: Palate elevates symmetrically. Phonation is normal.  KP:Dynloizm shrug 5/5. XII: tongue is midline without fasciculations. Motor: 5/5 strength in right arm and right leg, left arm patient is able to lift against gravity, left leg able to lift antigravity with slight drift Tone: is normal and bulk is normal Sensation- Intact to light touch bilaterally. Extinction absent to light touch to DSS.   Coordination: FTN intact bilaterally, HKS: no ataxia in BLE.No drift.  Gait- deferred   Discharge NIH: 1  Discharge Diet      Diet   Diet Heart Room service appropriate? Yes with Assist; Fluid consistency: Thin   liquids  DISCHARGE PLAN Disposition:  Transfer to [facility]hab for ongoing PT, OT and ST Eliquis  5mg  BID for secondary stroke prevention Recommend  ongoing stroke risk factor control by Primary Care Physician at time of discharge from inpatient rehabilitation. Follow-up PCP Loring Tanda Mae, MD in 2 weeks following discharge from rehab. Follow-up in [facility] in 8 weeks following discharge from rehab, office to schedule an appointment.   35 minutes were spent preparing discharge.  Rocky JAYSON Likes, DNP, AGACNP-BC [facility]  I have personally obtained history,examined this patient, reviewed notes, independently viewed imaging studies, participated in medical decision making and plan of care.ROS completed by me personally and pertinent positives fully documented  I have made any additions or clarifications directly to the above note. Agree with note above.    Eather Popp, MD Medical Director [facility] Pager: [phone] 11/09/2024 3:32 PM

## 2024-11-09 NOTE — TOC Progression Note (Signed)
 Transition of Care [facility]) - Progression Note    Patient Details  Name: Abigail Ryan MRN: [id number] Date of Birth: 14-Sep-1946  [facility] CM/SW Contact  Waddell Barnie Rama, RN Phone Number: 11/09/2024, 10:23 AM  Clinical Narrative:    Per Reche with CIR states she can take patient today.   Expected Discharge Plan: IP Rehab Facility Barriers to Discharge: Continued Medical Work up               Expected Discharge Plan and Services                                               Social Drivers of Health (SDOH) Interventions SDOH Screenings   Food Insecurity: No Food Insecurity (11/05/2024)  Housing: Low Risk  (11/05/2024)  Transportation Needs: No Transportation Needs (11/05/2024)  Utilities: Not At Risk (11/05/2024)  Social Connections: Unknown (11/05/2024)  Tobacco Use: Low Risk  (11/04/2024)    Readmission Risk Interventions     No data to display

## 2024-11-09 NOTE — Progress Notes (Signed)
 Discharged to 4W 08. Report was called by [facility]

## 2024-11-09 NOTE — H&P (Signed)
 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 She was found to have sensory deficits on left face also. CT head negative and TNKase  administered. MRI brain done revealing vague area of restricted diffusion within white matter and posterior limb right internal capsule c/w acute or subacute small vessel infarct and mild to moderate small vessel disease. MRA brain revealed diminutive L-VA with V4 segment severely stenotic. 2 D echo showed EF 70-75% with no wall abnormality.  Dr. 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 Elmira.  Of note; she had been on Xarelto  in 2020 after episode of A fib in setting of sepsis but this was d/c due to rectal bleeding and no further A fib episodes. Crestor  added due to LDL 151 and below goal.   She also has not signed up for Medicare part D and plans on natural substitute for blood thinner. Left shoulder pain reported post fall and X rays done showing moderate degenerative changes AC and glenohumeral joints. She has had improvement in left sided weakness but continues to be limited by weakness with left knee instability and requires min assist for mobility and min to max assist with ADLs. She lives alone, was independent PTA and CIR recommended due to functional decline.   Pt reports cannot afford Eliquis  and has no desire to take.  [place] 3 STE. Using purewick.     Review of Systems  Constitutional:  Positive for malaise/fatigue.  HENT: Negative.    Eyes: Negative.  Negative for blurred vision, double vision and discharge.  Respiratory: Negative.     Cardiovascular: Negative.  Negative for chest pain and leg swelling.  Gastrointestinal:  Negative for constipation, diarrhea, nausea and vomiting.  Genitourinary:  Positive for frequency.       Using purewick  Musculoskeletal:  Positive for joint pain and myalgias.       L shoulder and L upper arm pain from fall  Skin: Negative.   Neurological:  Positive for tingling, focal weakness and weakness.  Endo/Heme/Allergies:  Bruises/bleeds easily.  Psychiatric/Behavioral: Negative.    All other systems reviewed and are negative.    Past Medical History:  Diagnosis Date   Renal stones    Thyroid  disease     Past Surgical History:  Procedure Laterality Date   ABDOMINAL HYSTERECTOMY     APPENDECTOMY     CHOLECYSTECTOMY     Family History  Problem Relation Age of Onset   Breast cancer Daughter    Heart disease Mother 82   Heart disease Father 29    Social History:  reports that she has never smoked. She has never used smokeless tobacco. She reports that she does not drink alcohol  and does not use drugs.   Allergies  Allergen Reactions   Morphine And Codeine Nausea Only and Other (See Comments)    Extreme headaches  Reactions occur with both codeine and morphine.    Medications Prior to Admission  Medication Sig Dispense Refill   Aspirin -Salicylamide-Caffeine (BC HEADACHE POWDER PO) Take  1 packet by mouth 2 (two) times daily as needed (headache, pain).     CALCIUM  PO Take 1 tablet by mouth daily.     Cholecalciferol  (VITAMIN D -3 PO) Take 1 capsule by mouth daily.     KRILL OIL OMEGA-3 PO Take 1 capsule by mouth daily.     OVER THE COUNTER MEDICATION Take 1 capsule by mouth daily. OTC; CardioVibe     OVER THE COUNTER MEDICATION Take 1 tablet by mouth daily. OTC; Cardio-D     OVER THE COUNTER MEDICATION Take 1 tablet by mouth daily. OTC; Lung Support.     OVER THE COUNTER MEDICATION Take 1 capsule by mouth daily. OTC; Urinary Maintenance.     OVER THE COUNTER  MEDICATION Take 1 capsule by mouth daily. OTC; Immune Protect.     OVER THE COUNTER MEDICATION Take 1 capsule by mouth daily. OTC; Immune-5.     OVER THE COUNTER MEDICATION Take 1 capsule by mouth daily. OTC; Breathe Ease.     OVER THE COUNTER MEDICATION Take 1 capsule by mouth daily. OTC; Liver Support.     OVER THE COUNTER MEDICATION Take 1 capsule by mouth daily. OTC; Liver Detox.     levothyroxine  (SYNTHROID ) 150 MCG tablet Take 1 tablet (150 mcg total) by mouth daily before breakfast. (Patient taking differently: Take 150 mcg by mouth at bedtime.) 90 tablet 3   nitroGLYCERIN  (NITROSTAT ) 0.4 MG SL tablet Place 1 tablet (0.4 mg total) under the tongue every 5 (five) minutes as needed for chest pain. (Patient not taking: Reported on 11/04/2024) 25 tablet 5      Home: Home Living Family/patient expects to be discharged to:: Private residence Living Arrangements: Alone Available Help at Discharge: Family, Friend(s), Available 24 hours/day Type of Home: House Home Access: Stairs to enter [employer] of Steps: 3 (son suppsed to build a ramp at 1 entrance) Entrance Stairs-Rails: Right, Left Home Layout: One level Bathroom Shower/Tub: Engineer, [employer] Systems: Standard (does have one handicapped toilet) Bathroom Accessibility: Yes Home Equipment: Cane - single point (has access to other equipment if needed (her daughters))  Lives With: Alone   Functional History: Prior Function Prior Level of Function : Independent/Modified Independent Mobility Comments: indep, intermittent use of SPC outside ADLs Comments: indep, drives  Functional Status:  Mobility: Bed Mobility Overal bed mobility: Needs Assistance Bed Mobility: Supine to Sit, Sit to Supine Supine to sit: Mod assist, HOB elevated, Used rails Sit to supine: Mod assist, Used rails General bed mobility comments: Pt initially advancing BLE to L side of the bed, then to the R due to line constraints. Pt  demonstrates increased difficulty sitting to R side due to L shoulder pain/ decreased ROM. Assist for BLE elevation back to EOB Transfers Overall transfer level: Needs assistance Equipment used: Rolling walker (2 wheels) Transfers: Sit to/from Stand, Bed to chair/wheelchair/BSC Sit to Stand: Mod assist Bed to/from chair/wheelchair/BSC transfer type:: Step pivot Step pivot transfers: Min assist General transfer comment: STS from EOB and recliner with mod A for power up and pt demonstrating good carryover of hand placement from previous session. Pivot to recliner and back to EOB with assist for L knee blocking and cues for sequencing Ambulation/Gait Ambulation/Gait assistance: Min assist Gait Distance (Feet): 4 Feet Assistive device: Rolling walker (2 wheels) Gait Pattern/deviations: Step-to pattern, Decreased step length - left, Decreased stance time - left, Knee hyperextension - left, Decreased step length - right, Decreased stride length, Decreased weight shift to left General Gait Details: Pt demonstrates  L knee instability with hyperextension, but no overt buckling noted. Pt able to advance LLE, but requires cues for increased B step length Gait velocity: decr Pre-gait activities: standing marches x7 bilat in standing    ADL: ADL Overall ADL's : Needs assistance/impaired Eating/Feeding: Set up Grooming: Wash/dry hands, Wash/dry face, Oral care, Brushing hair, Set up, Sitting Grooming Details (indicate cue type and reason): in recliner Upper Body Bathing: Minimal assistance Lower Body Bathing: Moderate assistance, Sit to/from stand Upper Body Dressing : Minimal assistance Lower Body Dressing: Maximal assistance Lower Body Dressing Details (indicate cue type and reason): patient unable to doff or donn socks Toilet Transfer: Moderate assistance, Rolling walker (2 wheels) Toileting- Clothing Manipulation and Hygiene: Moderate assistance, Sitting/lateral lean, Sit to/from  stand Functional mobility during ADLs: Minimal assistance, +2 for physical assistance, +2 for safety/equipment General ADL Comments: LUE weakness but strength increasing  Cognition: Cognition Overall Cognitive Status: Within Functional Limits for tasks assessed Arousal/Alertness: Awake/alert Orientation Level: Oriented X4 Attention: Selective Selective Attention: Appears intact Memory: Appears intact Awareness: Appears intact Problem Solving: Appears intact Executive Function: Reasoning Reasoning: Appears intact Cognition Arousal: Alert Behavior During Therapy: WFL for tasks assessed/performed Overall Cognitive Status: Within Functional Limits for tasks assessed   Blood pressure 134/72, pulse 72, temperature 98.3 F (36.8 C), temperature source Oral, resp. rate 16, height 5' 3 (1.6 m), weight 114.6 kg, SpO2 94%. Physical Exam Vitals and nursing note reviewed.  Constitutional:      Appearance: Normal appearance. She is obese.     Comments: Pt sitting up in bed- awake, alert, appropriate, NAD  HENT:     Head: Normocephalic and atraumatic.     Comments: Mild L facial droop Tongue midline Facial sensation questionably less on L- she reports it's intermittent    Right Ear: External ear normal.     Left Ear: External ear normal.     Nose: Nose normal. No congestion.     Mouth/Throat:     Mouth: Mucous membranes are dry.     Pharynx: No oropharyngeal exudate.  Eyes:     General:        Right eye: No discharge.        Left eye: No discharge.     Extraocular Movements: Extraocular movements intact.     Comments: No nystagmus  Skin:    General: Skin is warm and dry.     Findings: Bruising present.     Comments: A lot of large purple bruising in Ue's- B/L from IV sticks and blood draws Midline R upper arm  Neurological:     Mental Status: She is alert.     Comments: Ox3 Questionable mild sensory deficit of L side of face No hoffman's Intact to light touch in x4  extremities No increased tone or clonus Intact naming and repetition   Psychiatric:        Mood and Affect: Mood normal.        Behavior: Behavior normal.     No results found for this or any previous visit (from the past 48 hours). No results found.    Blood pressure 134/72, pulse 72, temperature 98.3 F (36.8 C), temperature source Oral, resp. rate 16, height 5' 3 (1.6 m), weight 114.6 kg, SpO2 94%.  Medical Problem List and Plan: 1. 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  Antithrombotics: -DVT/anticoagulation:  Pharmaceutical: Eliquis - while in  hospital- pt doesn't want vs cannot afford to get Medicare D to cover it  -antiplatelet therapy: N/A 3. Pain Management: LCSW to follow for evaluation and support.  4. Mood/Behavior/Sleep: N/A  --Melatonin prn for insomnia.   -antipsychotic agents: N/A 5. Neuropsych/cognition: This patient is capable of making decisions on her own behalf. 6. Skin/Wound Care: Routine pressure relief measures.  7. Fluids/Electrolytes/Nutrition: Monitor I/O. Check CMET in am 8. T2DM: Hgb A1c-6.8 and diet controlled? No meds due to lack of insurance? --FBS 120-130. Will monitor BS ac/hs for trend and use SSI for elevated BS 9. Class III obesity: Educated on appropriate diet and exercise to help promote mobility and health.  --change diet  to heart healthy. 10.  A fib: Monitor HR TID 11. Hypothyroid: On synthroid  for supplement. 12.  Frequency: Will check UA reflex to culture. Monitor voiding with PVR checks.  --encourage water intake. 13. Hyperlipidemia: Chol- 216. LDL-151. Now on Crestor .  14. Low ionized calcium : Will add caltrate.         Sharlet GORMAN Schmitz, PA-C 11/09/2024  I have personally performed a face to face diagnostic evaluation of this patient and formulated the key components of the plan.  Additionally,  I have personally reviewed laboratory data, imaging studies, as well as relevant notes and concur with the physician assistant's documentation above.   The patient's status has not changed from the original H&P.  Any changes in documentation from the acute care chart have been noted above.

## 2024-11-10 DIAGNOSIS — I639 Cerebral infarction, unspecified: Secondary | ICD-10-CM | POA: Diagnosis not present

## 2024-11-10 LAB — CBC WITH DIFFERENTIAL/PLATELET
Abs Immature Granulocytes: 0.01 K/uL (ref 0.00–0.07)
Basophils Absolute: 0 K/uL (ref 0.0–0.1)
Basophils Relative: 0 %
Eosinophils Absolute: 0.3 K/uL (ref 0.0–0.5)
Eosinophils Relative: 4 %
HCT: 42.2 % (ref 36.0–46.0)
Hemoglobin: 14 g/dL (ref 12.0–15.0)
Immature Granulocytes: 0 %
Lymphocytes Relative: 20 %
Lymphs Abs: 1.6 K/uL (ref 0.7–4.0)
MCH: 29.5 pg (ref 26.0–34.0)
MCHC: 33.2 g/dL (ref 30.0–36.0)
MCV: 88.8 fL (ref 80.0–100.0)
Monocytes Absolute: 0.8 K/uL (ref 0.1–1.0)
Monocytes Relative: 10 %
Neutro Abs: 5.1 K/uL (ref 1.7–7.7)
Neutrophils Relative %: 66 %
Platelets: 242 K/uL (ref 150–400)
RBC: 4.75 MIL/uL (ref 3.87–5.11)
RDW: 13.5 % (ref 11.5–15.5)
WBC: 7.8 K/uL (ref 4.0–10.5)
nRBC: 0 % (ref 0.0–0.2)

## 2024-11-10 LAB — COMPREHENSIVE METABOLIC PANEL WITH GFR
ALT: 18 U/L (ref 0–44)
AST: 21 U/L (ref 15–41)
Albumin: 3.2 g/dL — ABNORMAL LOW (ref 3.5–5.0)
Alkaline Phosphatase: 71 U/L (ref 38–126)
Anion gap: 13 (ref 5–15)
BUN: 21 mg/dL (ref 8–23)
CO2: 28 mmol/L (ref 22–32)
Calcium: 9.4 mg/dL (ref 8.9–10.3)
Chloride: 97 mmol/L — ABNORMAL LOW (ref 98–111)
Creatinine, Ser: 1.08 mg/dL — ABNORMAL HIGH (ref 0.44–1.00)
GFR, Estimated: 52 mL/min — ABNORMAL LOW (ref >60)
Glucose, Bld: 130 mg/dL — ABNORMAL HIGH (ref 70–99)
Potassium: 4.6 mmol/L (ref 3.5–5.1)
Sodium: 138 mmol/L (ref 135–145)
Total Bilirubin: 0.5 mg/dL (ref 0.0–1.2)
Total Protein: 6.3 g/dL — ABNORMAL LOW (ref 6.5–8.1)

## 2024-11-10 LAB — GLUCOSE, CAPILLARY
Glucose-Capillary: 109 mg/dL — ABNORMAL HIGH (ref 70–99)
Glucose-Capillary: 108 mg/dL — ABNORMAL HIGH (ref 70–99)
Glucose-Capillary: 106 mg/dL — ABNORMAL HIGH (ref 70–99)
Glucose-Capillary: 172 mg/dL — ABNORMAL HIGH (ref 70–99)

## 2024-11-10 NOTE — Progress Notes (Signed)
 Occupational Therapy Note  Patient Details  Name: Abigail Ryan MRN: [id number] Date of Birth: Aug 20, 1946    Occupational Therapist participated in the interdisciplinary team conference, providing clinical information regarding the patient's current status, treatment goals, and weekly focus, including any barriers that need to be addressed. Please see the Inpatient Rehabilitation Team Conference and Plan of Care Update for further details.   Wilmont Olund M 11/10/2024, 11:17 AM

## 2024-11-10 NOTE — Progress Notes (Signed)
 Physical Therapy Session Note  Patient Details  Name: Abigail Ryan MRN: [id number] Date of Birth: Feb 09, 1946  Today's Date: 11/10/2024 PT Individual Time: 0901-0959 PT Individual Time Calculation (min): 58 min   Short Term Goals: Week 1:  PT Short Term Goal 1 (Week 1): Pt will complete 5xSTS with modA consistenly with LRAD and without cueing PT Short Term Goal 2 (Week 1): Pt will be able to standing with LRAD for 1 minute with CGA PT Short Term Goal 3 (Week 1): Pt will ambulated 88ft with LRAD and min-modA  Skilled Therapeutic Interventions/Progress Updates: Pt presented in [place] with nsg present preparing to transfer to toilet. Once nsg completed transfer handed off to this therapist. Pt with continent urinary void and completed stand from toilet with modA. PTA providing pericare total A for time management. Pt required heavy modA for clothing management. Pt transferred back to w/c and prior to sitting performed x 5 Sit to stand from [place] requiring minA. In w/c PTA providing education on unit, and introduction/review of binder. Pt then transferred to main gym and set up in parallel bars. Pt completed x 5 Sit to stand from parallel bars performing from modA and progressing to minA. Pt then participated in gait in parallel bars 45ft x 2 forward and backwards with minA. Pt pleased with ability to ambulate this session. Pt transported back to room at end of session and remained in w/c with belt alarm on, call bell within reach and needs met.    Therapy Documentation Precautions:  Precautions Precautions: Fall Recall of Precautions/Restrictions: Intact Restrictions Weight Bearing Restrictions Per Provider Order: No General:   Vital Signs:   Pain:     Therapy/Group: Individual Therapy  Joahan Swatzell 11/10/2024, 12:35 PM

## 2024-11-10 NOTE — Progress Notes (Signed)
 Inpatient Rehabilitation  Patient information reviewed and entered into eRehab system by [facility]. Karen Kays., CCC/SLP, PPS Coordinator.  Information including medical coding, functional ability and quality indicators will be reviewed and updated through discharge.

## 2024-11-10 NOTE — Plan of Care (Signed)
  RD consulted for nutrition education regarding diabetes. Pt reports she was diagnosed with prediabetes over 12 years ago and has maintained an A1c under 7 since then with diet and supplement use. Endorsed no use of medications for management. Pt endorses use of supplements like CardioVibe, CaridoD, St John's Wort, and SugReg regularly at home to manage prediabetes and cardiovascular disease. Pt does not wish to pursue medication management at this time and will return to supplement use once discharged. Advocated for pt to discuss all supplements with PCP.   Lab Results  Component Value Date   HGBA1C 6.8 (H) 11/05/2024    RD provided Consistent Carbohydrate Intake and Power of Protein in Diabetes handouts from the Academy of Nutrition and Dietetics. Discussed different food groups and their effects on blood sugar, emphasizing carbohydrate-containing foods. Provided list of carbohydrates and recommended serving sizes of common foods.   Discussed importance of controlled and consistent carbohydrate intake throughout the day. Provided examples of ways to balance meals/snacks and encouraged intake of high-fiber, whole grain complex carbohydrates. Discussed importance of including protein at every meal to help post meal glucose spike. Pt reports she would usually eat 2-3 x per day. Breakfast: sausage balls w/ coffee (or sometimes skip), Lunch: salad w/ protein OR meat and side OR sandwich w/ chicken salad, Dinner: home cooked meat + side + vegetable. Pt endorses some juice intake but otherwise drinks coffee and water. Discussed ways to include more whole grains into diet and balance all meals. Pt appreciative of education and receptive. Teach back method used.  Expect good compliance.  Body mass index is 41.13 kg/m. Pt meets criteria for obesity class III based on current BMI.  Current diet order is Heart Healthy, patient is consuming approximately 85-90% of meals at this time. Would consider  changing diet order to Carb Modified instead of Heart Healthy, but pt does not need both restrictions. Labs and medications reviewed. No further nutrition interventions warranted at this time. RD contact information provided. If additional nutrition issues arise, please re-consult RD.    Abigail Glance, MS, RDN, LDN Clinical Dietitian I Please reach out via secure chat

## 2024-11-10 NOTE — Progress Notes (Signed)
 Physical Therapy Note  Patient Details  Name: HEAVAN FRANCOM MRN: [id number] Date of Birth: 09/18/1946 Today's Date: 11/10/2024    Physical Therapist participated in the interdisciplinary team conference, providing clinical information regarding the patient's current status, treatment goals, and weekly focus, including any barriers that need to be addressed. Please see the Inpatient Rehabilitation Team Conference and Plan of Care Update for further details.    Ubah Radke P Shantera Monts 11/10/2024, 11:05 AM

## 2024-11-10 NOTE — Patient Care Conference (Signed)
 Inpatient RehabilitationTeam Conference and Plan of Care Update Date: 11/10/2024   Time: 11:03 AM    Patient Name: Abigail Ryan      Medical Record Number: [id number]  Date of Birth: 07/11/1946 Sex: Female         Room/Bed: 4W08C/4W08C-01 Payor Info: Payor: [employer] MEDICARE / Plan: [facility] MEDICARE / Product Type: *No Product type* /    Admit Date/Time:  11/09/2024  1:49 PM  Primary Diagnosis:  Embolic stroke of right basal ganglia [facility])  Hospital Problems: Principal Problem:   Embolic stroke of right basal ganglia [facility])    Expected Discharge Date: Expected Discharge Date: 11/25/24  Team Members Present: Physician leading conference: Dr. Sven Elks Social Worker Present: Waverly Gentry, LCSW-A Nurse Present: Barnie Ronde, RN PT Present: Sherlean Perks, PT OT Present: Monica Peacock, OT     Current Status/Progress Goal Weekly Team Focus  Bowel/Bladder   Patient has been having regular bladder and bowel elimination. She is continent for both. Bladder scan done with a post void residual of 0. Paaient had initially complained of foul-smelling urine and a UA was done.   Patient viods sponteneously and has regular normal bowel movements. Patient urinary symptoms resolve.   Noemal regular bowel and bladder elimination.    Swallow/Nutrition/ Hydration               ADL's   Mod assist BADL   Supervision / mod I BADL   Improve functional use of left limbs, increase independence with BADL    Mobility   PT EVAL PENDING           Communication                Safety/Cognition/ Behavioral Observations               Pain   Patient not in pain   Patient stagys pain free   Optimal pain control    Skin   Patient skin is clean, dry and intact with echymosis to bilateral arms and a skin tear to the left elbow.   Patient skin remain clean, dry, and intact and does not develop any wounds while admitted.  Optimal skin care and integrity      Discharge  Planning:    Evals pending  Team Discussion: Patient admitted post right basal ganglia CVA with left sided weakness; history of A-Fib on Eliquis  and HLD on Crestor . Progress limited by lightheadedness and dizziness during transitions.   Patient on target to meet rehab goals: yes, currently needs mod assist for ADLs. Needs mod - max assist for transfers and max assist to manage steps and ambulate up to 10'.  Goals for discharge set for supervision overall.  *See Care Plan and progress notes for long and short-term goals.   Revisions to Treatment Plan:  N/a   Teaching Needs: Safety, medications, dietary modification, transfers, toileting, etc.   Current Barriers to Discharge: Decreased caregiver support, Home enviroment access/layout, and Medication compliance  Possible Resolutions to Barriers: Family education     Medical Summary Current Status: AKI, bruising, atrial fibrillation, class3 obesity, hypocalcemia  Barriers to Discharge: Medical stability  Barriers to Discharge Comments: AKI, bruising, atrial fibrillation, class 3 obesity, hypocalcemia Possible Resolutions to [employer] Focus: continue to encourage hydration, continue Eliquis , provide dietary education, continue calcium  citrate   Continued Need for Acute Rehabilitation Level of Care: The patient requires daily medical management by a physician with specialized training in physical medicine and rehabilitation for the following reasons: Direction  of a multidisciplinary physical rehabilitation program to maximize functional independence : Yes Medical management of patient stability for increased activity during participation in an intensive rehabilitation regime.: Yes Analysis of laboratory values and/or radiology reports with any subsequent need for medication adjustment and/or medical intervention. : Yes   I attest that I was present, lead the team conference, and concur with the assessment and plan of the  team.   [name] 11/10/2024, 3:08 PM

## 2024-11-10 NOTE — Progress Notes (Signed)
 Inpatient Rehabilitation Center Individual Statement of Services  Patient Name:  Abigail Ryan  Date:  11/10/2024  Welcome to the Inpatient Rehabilitation Center.  Our goal is to provide you with an individualized program based on your diagnosis and situation, designed to meet your specific needs.  With this comprehensive rehabilitation program, you will be expected to participate in at least 3 hours of rehabilitation therapies Monday-Friday, with modified therapy programming on the weekends.  Your rehabilitation program will include the following services:  Physical Therapy (PT), Occupational Therapy (OT), Speech Therapy (ST), 24 hour per day rehabilitation nursing, Therapeutic Recreaction (TR), Care Coordinator, Rehabilitation Medicine, Nutrition Services, and Pharmacy Services  Weekly team conferences will be held on Wednesday to discuss your progress.  Your Inpatient Rehabilitation Care Coordinator will talk with you frequently to get your input and to update you on team discussions.  Team conferences with you and your family in attendance may also be held.  Expected length of stay: 10 days   Overall anticipated outcome: Supervision/Verbal cueing    Depending on your progress and recovery, your program may change. Your Inpatient Rehabilitation Care Coordinator will coordinate services and will keep you informed of any changes. Your Inpatient Rehabilitation Care Coordinator's name and contact numbers are listed  below.  The following services may also be recommended but are not provided by the Inpatient Rehabilitation Center:  Driving Evaluations Home Health Rehabiltiation Services Outpatient Rehabilitation Services Vocational Rehabilitation   Arrangements will be made to provide these services after discharge if needed.  Arrangements include referral to agencies that provide these services.  Your insurance has been verified to be:  [employer] MEDICARE / [facility] MEDICARE  Your primary  doctor is:  Loring Tanda Mae, MD  Pertinent information will be shared with your doctor and your insurance company.  Inpatient Rehabilitation Care Coordinator:  [name] [name] SIERRAS [phone] or ELIGAH BRINKS  Information discussed with and copy given to patient by: Waverly [name], 11/10/2024, 2:25 PM

## 2024-11-10 NOTE — Progress Notes (Signed)
 Inpatient Rehabilitation Care Coordinator Assessment and Plan Patient Details  Name: Abigail Ryan MRN: [id number] Date of Birth: [birth year]  Today's Date: 11/10/2024  Hospital Problems: Principal Problem:   Embolic stroke of right basal ganglia [facility])  Past Medical History:  Past Medical History:  Diagnosis Date   Renal stones    Thyroid  disease    Past Surgical History:  Past Surgical History:  Procedure Laterality Date   ABDOMINAL HYSTERECTOMY     APPENDECTOMY     CHOLECYSTECTOMY     Social History:  reports that she has never smoked. She has never used smokeless tobacco. She reports that she does not drink alcohol  and does not use drugs.  Family / Support Systems Marital Status: Widow/Widower Patient Roles: Parent, Other (Comment) (Grandparent) Children: Warren Axon and Christopher Other Supports: Sons and grandchildren Anticipated Caregiver: Warren and other family members as needed Caregiver Availability: 24/7 Family Dynamics: Great family support  Social History Preferred language: English Religion: Methodist Education: [employer] - How often do you need to have someone help you when you read instructions, pamphlets, or other written material from your doctor or pharmacy?: Never Writes: Yes Employment Status: Employed Name of Employer: [employer] Return to Work Plans: Plans to return to work when able   Abuse/Neglect Abuse/Neglect Assessment Can Be Completed: Yes Physical Abuse: Denies Verbal Abuse: Denies Sexual Abuse: Denies Exploitation of patient/patient's resources: Denies Self-Neglect: Denies  Patient response to: Social Isolation - How often do you feel lonely or isolated from those around you?: Never  Emotional Status Pt's affect, behavior and adjustment status: Patient is adjusting well to therapy Recent Psychosocial Issues: None Psychiatric History: None Substance Abuse History: None  Patient / Family Perceptions, Expectations &  Goals Pt/Family understanding of illness & functional limitations: Patient/family understanding of illness & functional limitations Premorbid pt/family roles/activities: Very active, working, parent, 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, farming Anticipated changes in roles/activities/participation: None anticipated Pt/family expectations/goals: Research Scientist (life Sciences): None Premorbid Home Care/DME Agencies: None Transportation available at discharge: Warren Is the patient able to respond to transportation needs?: Yes In the past 12 months, has lack of transportation kept you from medical appointments or from getting medications?: No In the past 12 months, has lack of transportation kept you from meetings, work, or from getting things needed for daily living?: No  Discharge Planning Living Arrangements: Alone Support Systems: Children, Other relatives Type of Residence: Private residence Insurance Resources: Media Planner (specify) (UNITED HEALTHCARE MEDICARE / UHC MEDICARE) Financial Resources: Employment Financial Screen Referred: Yes Living Expenses: Own Money Management: Patient Does the patient have any problems obtaining your medications?: No Home Management: Patient manages her own home Patient/Family Preliminary Plans: Plans to return home and work with the strong support of her family. Her son Axon will rebuld her ramp Care Coordinator Anticipated Follow Up Needs: HH/OP Expected length of stay: 10 days  Clinical Impression CSW met with patient/family to introduce herself and complete initial assessment. Patient is able to make all needs known. Patient presented to [facility] following an CVA episode. Mr. 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 Her daughter plans to move in wit hehr as needed following discharge until she recovers. Her son plans to rebuild the ramp into the home. Mrs. Colborn is very  confident that she will imprive enough in therapy that she will be able to discharge before her anticipated discharge date. Patient is aware of insurance covering her [facility] stay fully after the  first 5 days.   There were no further needs or concerns at present. CSW will follow up with family and continue to follow. Will provide patient/family with an update as soon as one becomes available.   [name]  [name] 11/10/2024, 2:26 PM

## 2024-11-10 NOTE — Plan of Care (Signed)
  Problem: Consults Goal: RH STROKE PATIENT EDUCATION Description: See Patient Education module for education specifics  Outcome: Progressing   Problem: RH SAFETY Goal: RH STG ADHERE TO SAFETY PRECAUTIONS W/ASSISTANCE/DEVICE Description: STG Adhere to Safety Precautions With cues Assistance/Device. Outcome: Progressing   Problem: RH KNOWLEDGE DEFICIT Goal: RH STG INCREASE KNOWLEDGE OF DIABETES Description: Patient and family will be able to manage DM using educational resources for medications and dietary modification independently Outcome: Progressing Goal: RH STG INCREASE KNOWLEDGE OF HYPERTENSION Description: Patient and family will be able to manage HTN using educational resources for medications and dietary modification independently Outcome: Progressing Goal: RH STG INCREASE KNOWLEGDE OF HYPERLIPIDEMIA Description: Patient and family will be able to manage HLD using educational resources for medications and dietary modification independently Outcome: Progressing Goal: RH STG INCREASE KNOWLEDGE OF STROKE PROPHYLAXIS Description: Patient and family will be able to manage secondary risks using educational resources for medications and dietary modification independently Outcome: Progressing   Problem: Education: Goal: Ability to describe self-care measures that may prevent or decrease complications (Diabetes Survival Skills Education) will improve Outcome: Progressing Goal: Individualized Educational Video(s) Outcome: Progressing   Problem: Coping: Goal: Ability to adjust to condition or change in health will improve Outcome: Progressing   Problem: Fluid Volume: Goal: Ability to maintain a balanced intake and output will improve Outcome: Progressing   Problem: Health Behavior/Discharge Planning: Goal: Ability to identify and utilize available resources and services will improve Outcome: Progressing Goal: Ability to manage health-related needs will improve Outcome:  Progressing   Problem: Metabolic: Goal: Ability to maintain appropriate glucose levels will improve Outcome: Progressing   Problem: Nutritional: Goal: Maintenance of adequate nutrition will improve Outcome: Progressing Goal: Progress toward achieving an optimal weight will improve Outcome: Progressing   Problem: Skin Integrity: Goal: Risk for impaired skin integrity will decrease Outcome: Progressing   Problem: Tissue Perfusion: Goal: Adequacy of tissue perfusion will improve Outcome: Progressing

## 2024-11-10 NOTE — Progress Notes (Signed)
 Patient ID: Abigail Ryan, female   DOB: 03/20/1946, 78 y.o.   MRN: [id number]  Have reviewed team conference with pt and family. Both aware and agreeable with targeted d/c date of 12/18 and goals of Supervision/Verbal cueing.

## 2024-11-10 NOTE — Progress Notes (Signed)
 Physical Therapy Assessment and Plan  Patient Details  Name: Abigail Ryan MRN: [id number] Date of Birth: June 29, 1946  PT Diagnosis: Abnormality of gait, Difficulty walking, Hemiplegia non-dominant, and Muscle weakness Rehab Potential: Good ELOS: 14-16 days   Today's Date: 11/10/2024 PT Individual Time: 0901-0959 PT Individual Time Calculation (min): 58 min    Hospital Problem: Principal Problem:   Embolic stroke of right basal ganglia (HCC)   Past Medical History:  Past Medical History:  Diagnosis Date   Renal stones    Thyroid  disease    Past Surgical History:  Past Surgical History:  Procedure Laterality Date   ABDOMINAL HYSTERECTOMY     APPENDECTOMY     CHOLECYSTECTOMY      Assessment & Plan Clinical Impression: Patient is a 78 year old female with history of HTN, T2DM,  obesity class III- BMI 41, A Fib in setting of sepsis in the past, L Bell's palsy  for a couple of days 5 years ago who was admitted on 11/04/24 with left sided weakness and fall. She was found to have sensory deficits on left face also. CT head negative and TNKase  administered. MRI brain done revealing vague area of restricted diffusion within white matter and posterior limb right internal capsule c/w acute or subacute small vessel infarct and mild to moderate small vessel disease. MRA brain revealed diminutive L-VA with V4 segment severely stenotic. 2 D echo showed EF 70-75% with no wall abnormality.  Dr. 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 Elmira.  Of note; she had been on Xarelto  in 2020 after episode of A fib in setting of sepsis but this was d/c due to rectal bleeding and no further A fib episodes. Crestor  added due to LDL 151 and below goal.    She also has not signed up for Medicare part D and plans on natural substitute for blood thinner. Left shoulder pain reported post fall and X rays done showing moderate degenerative changes AC and  glenohumeral joints. She has had improvement in left sided weakness but continues to be limited by weakness with left knee instability and requires min assist for mobility and min to max assist with ADLs. She lives alone, was independent PTA and CIR recommended due to functional decline. Patient transferred to CIR on 11/09/2024 .   Patient currently requires mod with mobility secondary to muscle weakness, decreased cardiorespiratoy endurance, and decreased standing balance and decreased balance strategies.  Prior to hospitalization, patient was independent  with mobility and lived with Alone (daughter plans on moving in upon d/c) in a House home.  Home access is 3Stairs to enter.  Patient will benefit from skilled PT intervention to maximize safe functional mobility, minimize fall risk, and decrease caregiver burden for planned discharge home with intermittent assist.  Anticipate patient will benefit from follow up OP at discharge.  PT - End of Session Activity Tolerance: Tolerates 30+ min activity with multiple rests Endurance Deficit: Yes PT Assessment Rehab Potential (ACUTE/IP ONLY): Good PT Barriers to Discharge: Decreased caregiver support;Weight PT Patient demonstrates impairments in the following area(s): Balance;Behavior;Endurance;Motor;Perception;Safety PT Transfers Functional Problem(s): Bed Mobility;Bed to Chair;Car;Furniture PT Locomotion Functional Problem(s): Ambulation;Stairs PT Plan PT Intensity: Minimum of 1-2 x/day ,45 to 90 minutes PT Frequency: 5 out of 7 days PT Duration Estimated Length of Stay: 14-16 days PT Treatment/Interventions: Ambulation/gait training;Discharge planning;Functional mobility training;Psychosocial support;Therapeutic Activities;Balance/vestibular training;Disease management/prevention;Neuromuscular re-education;Therapeutic Exercise;Wheelchair propulsion/positioning;Cognitive remediation/compensation;DME/adaptive equipment instruction;Pain  management;Splinting/orthotics;UE/LE Strength taining/ROM;Community reintegration;Functional electrical stimulation;Patient/family education;Stair  training;UE/LE Coordination activities PT Transfers Anticipated Outcome(s): supervision PT Locomotion Anticipated Outcome(s): supervision PT Recommendation Follow Up Recommendations: Outpatient PT Patient destination: Home Equipment Recommended: To be determined   PT Evaluation Precautions/Restrictions Precautions Precautions: Fall Recall of Precautions/Restrictions: Intact Restrictions Weight Bearing Restrictions Per Provider Order: No Pain Interference Pain Interference Pain Effect on Sleep: 1. Rarely or not at all Pain Interference with Therapy Activities: 1. Rarely or not at all Pain Interference with Day-to-Day Activities: 1. Rarely or not at all Home Living/Prior Functioning Home Living Available Help at Discharge: Family;Friend(s);Available 24 hours/day Type of Home: House Home Access: Stairs to enter [employer] of Steps: 3 Entrance Stairs-Rails: Right;Left Home Layout: One level Bathroom Shower/Tub: Engineer, Manufacturing Systems: Standard Bathroom Accessibility: Yes Additional Comments: pet dog - Dolly  Lives With: Alone (daughter plans on moving in upon d/c) Prior Function Level of Independence: Independent with basic ADLs  Able to Take Stairs?: Yes Driving: Yes Vocation: Retired Vision/Perception  Vision - History Ability to See in Adequate Light: 0 Adequate  Cognition Overall Cognitive Status: Within Functional Limits for tasks assessed Arousal/Alertness: Awake/alert Orientation Level: Oriented X4 Year: 2025 Month: December Day of Week: Correct Sensation Sensation Light Touch: Appears Intact Hot/Cold: Not tested Proprioception: Not tested Stereognosis: Not tested Coordination Gross Motor Movements are Fluid and Coordinated: Yes Fine Motor Movements are Fluid and Coordinated: No Coordination  and Movement Description: toe tapping whille assessing B DF uncoordinated Motor  Motor Motor: Hemiplegia  Trunk/Postural Assessment  Cervical Assessment Cervical Assessment: Within Functional Limits Thoracic Assessment Thoracic Assessment: Within Functional Limits Lumbar Assessment Lumbar Assessment: Exceptions to [facility] Postural Control Postural Control: Deficits on evaluation  Balance Balance Balance Assessed: Yes Static Sitting Balance Static Sitting - Balance Support: No upper extremity supported;Feet supported Static Sitting - Level of Assistance: 5: Stand by assistance Dynamic Sitting Balance Dynamic Sitting - Balance Support: Feet supported;During functional activity Dynamic Sitting - Level of Assistance: 4: Min assist Dynamic Sitting - Balance Activities: Lateral lean/weight shifting Static Standing Balance Static Standing - Balance Support: During functional activity;Bilateral upper extremity supported Static Standing - Level of Assistance: 3: Mod assist Dynamic Standing Balance Dynamic Standing - Balance Support: During functional activity;Bilateral upper extremity supported Dynamic Standing - Level of Assistance: 2: Max assist Extremity Assessment  RLE Assessment RLE Assessment: Within Functional Limits LLE Assessment LLE Assessment: Exceptions to [facility] Active Range of Motion (AROM) Comments: decr DF General Strength Comments: decr DF  Care Tool Care Tool Bed Mobility Roll left and right activity   Roll left and right assist level: Moderate Assistance - Patient 50 - 74%    Sit to lying activity   Sit to lying assist level: Moderate Assistance - Patient 50 - 74%    Lying to sitting on side of bed activity   Lying to sitting on side of bed assist level: the ability to move from lying on the back to sitting on the side of the bed with no back support.: Moderate Assistance - Patient 50 - 74%     Care Tool Transfers Sit to stand transfer   Sit to stand assist  level: Maximal Assistance - Patient 25 - 49%    Chair/bed transfer   Chair/bed transfer assist level: Maximal Assistance - Patient 25 - 49%    Car transfer   Car transfer assist level: Maximal Assistance - Patient 25 - 49%      Care Tool Locomotion Ambulation   Assist level: Maximal Assistance - Patient 25 - 49% Assistive device: Walker-rolling Max  distance: 64ft  Walk 10 feet activity   Assist level: Maximal Assistance - Patient 25 - 49% Assistive device: Walker-rolling   Walk 50 feet with 2 turns activity Walk 50 feet with 2 turns activity did not occur: Safety/medical concerns      Walk 150 feet activity Walk 150 feet activity did not occur: Safety/medical concerns      Walk 10 feet on uneven surfaces activity Walk 10 feet on uneven surfaces activity did not occur: Safety/medical concerns      Stairs   Assist level: 2 helpers Stairs assistive device: 2 hand rails Max number of stairs: 4  Walk up/down 1 step activity   Walk up/down 1 step (curb) assist level: 2 helpers Walk up/down 1 step or curb assistive device: 2 hand rails  Walk up/down 4 steps activity   Walk up/down 4 steps assist level: 2 helpers Walk up/down 4 steps assistive device: 2 hand rails  Walk up/down 12 steps activity Walk up/down 12 steps activity did not occur: Safety/medical concerns      Pick up small objects from floor Pick up small object from the floor (from standing position) activity did not occur: Safety/medical concerns      Wheelchair Is the patient using a wheelchair?: Yes Type of Wheelchair: Manual   Wheelchair assist level: Minimal Assistance - Patient > 75% Max wheelchair distance: 39ft  Wheel 50 feet with 2 turns activity   Assist Level: Minimal Assistance - Patient > 75%  Wheel 150 feet activity   Assist Level: Maximal Assistance - Patient 25 - 49%    Refer to Care Plan for Long Term Goals  SHORT TERM GOAL WEEK 1 PT Short Term Goal 1 (Week 1): Pt will complete 5xSTS with  modA consistenly with LRAD and without cueing PT Short Term Goal 2 (Week 1): Pt will be able to standing with LRAD for 1 minute with CGA PT Short Term Goal 3 (Week 1): Pt will ambulated 70ft with LRAD and min-modA  Recommendations for other services: None   Skilled Therapeutic Intervention Mobility Bed Mobility Bed Mobility: Rolling Right;Rolling Left;Sit to Supine;Supine to Sit Rolling Right: Moderate Assistance - Patient 50-74% Rolling Left: Moderate Assistance - Patient 50-74% Supine to Sit: Moderate Assistance - Patient 50-74% Sit to Supine: Moderate Assistance - Patient 50-74% Transfers Transfers: Sit to Stand;Stand to Sit;Stand Pivot Transfers Sit to Stand: Maximal Assistance - Patient 25-49% Stand to Sit: Moderate Assistance - Patient 50-74% Stand Pivot Transfers: Moderate Assistance - Patient 50 - 74% Stand Pivot Transfer Details: Verbal cues for sequencing;Verbal cues for gait pattern;Verbal cues for technique;Verbal cues for safe use of DME/AE;Verbal cues for precautions/safety;Tactile cues for sequencing;Tactile cues for placement;Visual cues/gestures for sequencing;Visual cues for safe use of DME/AE Transfer (Assistive device): Rolling walker Locomotion  Gait Ambulation: Yes Gait Assistance: Maximal Assistance - Patient 25-49% Gait Distance (Feet): 10 Feet Assistive device: Rolling walker Gait Assistance Details: Verbal cues for sequencing;Verbal cues for gait pattern;Verbal cues for safe use of DME/AE;Verbal cues for technique;Verbal cues for precautions/safety;Tactile cues for placement;Visual cues for safe use of DME/AE;Visual cues/gestures for sequencing;Tactile cues for sequencing Gait Gait: Yes Gait Pattern: Impaired Gait Pattern: Decreased step length - right;Decreased stance time - left;Decreased step length - left;Decreased hip/knee flexion - right;Decreased hip/knee flexion - left;Decreased dorsiflexion - left;Decreased weight shift to left;Step-to pattern;Narrow  base of support;Poor foot clearance - left;Poor foot clearance - right;Trunk flexed;Left foot flat;Decreased stride length Gait velocity: decr Stairs / Additional Locomotion Stairs: Yes Stairs Assistance: 2 Helpers;Maximal Assistance -  Patient 25 - 49% Stair Management Technique: Two rails Number of Stairs: 4 Height of Stairs: 6 Wheelchair Mobility Wheelchair Mobility: Yes Wheelchair Assistance: Minimal assistance - Patient >75% Wheelchair Propulsion: Both upper extremities Wheelchair Parts Management: Supervision/cueing Distance: 70ft  Session: Pt seated in WC and agreeable to therapy upon arrival. Pt provides education on POC and goals. Individual treatment initiated with focus on functional mobility/transfers, gait training, generalized strengthening and endurance, dynamic standing balance/coordination, pt education and discharge planning. Pt is very motivated in progressing her mobility levels and returning home with minimal help from her daughter. During ambulation, PT observed decreased LLE strength and min L knee buckling. Following eval (see details above), pt returned to room via [facility] and was left seated in WC, belt alarm activated, all need within reach.   Discharge Criteria: Patient will be discharged from PT if patient refuses treatment 3 consecutive times without medical reason, if treatment goals not met, if there is a change in medical status, if patient makes no progress towards goals or if patient is discharged from hospital.  The above assessment, treatment plan, treatment alternatives and goals were discussed and mutually agreed upon: by patient  Ileana Resides SPT 11/10/2024, 12:55 PM

## 2024-11-10 NOTE — Progress Notes (Signed)
 Occupational Therapy Session Note  Patient Details  Name: SAPHYRE CILLO MRN: [id number] Date of Birth: 1946/10/12  Today's Date: 11/10/2024 OT Individual Time: 1401-1500 OT Individual Time Calculation (min): 59 min    Short Term Goals: Week 1:  OT Short Term Goal 1 (Week 1): Patient will transfer to  shower with min assist OT Short Term Goal 2 (Week 1): Patient will transfer to toilet with min assist OT Short Term Goal 3 (Week 1): Patient will don LB clothing- pants/ underwear with min assist OT Short Term Goal 4 (Week 1): Patient will use left hand as gross assist to RUE during BADL  Skilled Therapeutic Interventions/Progress Updates:    Patient agreeable to participate in OT session. Reports no pain level. Patient received in room in wc. Patient participated in skilled OT session focusing on functional mobility, sit to stands, fine motor control, LUE coordination. Patient completed transfer to wc with min A. Patient completed sit to stand training and LLE coordination to decreased buckling in knee leading to increased fall risk. OT facilitated positioning of LE due to hyperextension. Patient able to complete 2 sets of sit to stands x5. Patient then completed fine motor/ UE coordination activity of small pegs with picture copying. Patient able to complete all pegs except 3 with affected extremity utilizing unaffected extremity for in hand manipulation. Patient reported increased fatigue in LUE shoulder due to compensatory technique 2/2 poor muscular control in in distal UE. Patient then educated on functional activities and ability for improvement to enhance mood. Patient returned to room via wc, transfer to bed min A, bed mobility min A sit to supine. All needs in reach alarm on.       Therapy Documentation Precautions:  Restrictions Weight Bearing Restrictions Per Provider Order: No  Therapy/Group: Individual Therapy  D'mariea L Shebra Muldrow 11/10/2024, 7:31 AM

## 2024-11-10 NOTE — Progress Notes (Signed)
 PROGRESS NOTE   Subjective/Complaints: Patient's chart reviewed- No issues reported overnight Vitals signs stable  Discussed with nursing that midline can be removed  ROS: LBM 12/2   Objective:   No results found. Recent Labs    11/10/24 0518  WBC 7.8  HGB 14.0  HCT 42.2  PLT 242   Recent Labs    11/09/24 1421 11/10/24 0518  NA 134* 138  K 4.7 4.6  CL 99 97*  CO2 27 28  GLUCOSE 132* 130*  BUN 18 21  CREATININE 0.88 1.08*  CALCIUM  8.9 9.4    Intake/Output Summary (Last 24 hours) at 11/10/2024 0912 Last data filed at 11/10/2024 9278 Gross per 24 hour  Intake 840 ml  Output 875 ml  Net -35 ml        Physical Exam: Vital Signs Blood pressure 136/67, pulse 75, temperature 98.2 F (36.8 C), temperature source Oral, resp. rate 17, height 5' 4 (1.626 m), weight 108.7 kg, SpO2 94%. Gen: no distress, normal appearing HENT:     Head: Normocephalic and atraumatic.     Comments: Mild L facial droop Tongue midline Facial sensation questionably less on L- she reports it's intermittent    Right Ear: External ear normal.     Left Ear: External ear normal.     Nose: Nose normal. No congestion.     Mouth/Throat:     Mouth: Mucous membranes are dry.     Pharynx: No oropharyngeal exudate.  Eyes:     General:        Right eye: No discharge.        Left eye: No discharge.     Extraocular Movements: Extraocular movements intact.     Comments: No nystagmus  Skin:    General: Skin is warm and dry.     Findings: Bruising present.     Comments: A lot of large purple bruising in Ue's- B/L from IV sticks and blood draws Midline R upper arm  Neurological:     Mental Status: She is alert.     Comments: Ox3 Questionable mild sensory deficit of L side of face No hoffman's Intact to light touch in x4 extremities No increased tone or clonus Intact naming and repetition   Psychiatric:        Mood and Affect: Mood  normal.        Behavior: Behavior normal.      Assessment/Plan: 1. Functional deficits which require 3+ hours per day of interdisciplinary therapy in a comprehensive inpatient rehab setting. Physiatrist is providing close team supervision and 24 hour management of active medical problems listed below. Physiatrist and rehab team continue to assess barriers to discharge/monitor patient progress toward functional and medical goals  Care Tool:  Bathing              Bathing assist       Upper Body Dressing/Undressing Upper body dressing        Upper body assist      Lower Body Dressing/Undressing Lower body dressing            Lower body assist       Toileting Toileting    Toileting assist  Transfers Chair/bed transfer  Transfers assist           Locomotion Ambulation   Ambulation assist              Walk 10 feet activity   Assist           Walk 50 feet activity   Assist           Walk 150 feet activity   Assist           Walk 10 feet on uneven surface  activity   Assist           Wheelchair     Assist               Wheelchair 50 feet with 2 turns activity    Assist            Wheelchair 150 feet activity     Assist          Blood pressure 136/67, pulse 75, temperature 98.2 F (36.8 C), temperature source Oral, resp. rate 17, height 5' 4 (1.626 m), weight 108.7 kg, SpO2 94%.  Medical Problem List and Plan: 1. 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  Antithrombotics: -DVT/anticoagulation:  Pharmaceutical: Eliquis - while in hospital- pt doesn't want vs cannot afford to get Medicare D to cover it             -antiplatelet therapy: N/A 3. Pain Management: LCSW to follow for evaluation and support.  4. Mood/Behavior/Sleep:  N/A             --Melatonin prn for insomnia.              -antipsychotic agents: N/A 5. Neuropsych/cognition: This patient is capable of making decisions on her own behalf. 6. Skin/Wound Care: Routine pressure relief measures.  7. Fluids/Electrolytes/Nutrition: Monitor I/O. Check CMET in am 8. T2DM: Hgb A1c-6.8 and diet controlled? No meds due to lack of insurance? --FBS 120-130. Will monitor BS ac/hs for trend and use SSI for elevated BS 9. Class III obesity: Educated on appropriate diet and exercise to help promote mobility and health.             --change diet  to heart healthy. 10.  A fib: Monitor HR TID  11. Hypothyroid: Continue synthroid  for supplement.  12.  Frequency: Discussed UA is mildly positive, f/u UC  13. Hyperlipidemia: Chol- 216. LDL-151. continue Crestor .   14. Low ionized calcium : continue caltrate.     LOS: 1 days A FACE TO FACE EVALUATION WAS PERFORMED  Jordanny Waddington P Delainie Chavana 11/10/2024, 9:12 AM

## 2024-11-10 NOTE — Progress Notes (Signed)
 Inpatient Rehabilitation Admission Medication Review by a Pharmacist  A complete drug regimen review was completed for this patient to identify any potential clinically significant medication issues.  High Risk Drug Classes Is patient taking? Indication by Medication  Antipsychotic Yes, as an intravenous medication Compazine IV/PO/PR - nausea  Anticoagulant Yes Apixaban  -Afib, stroke prevention   Antibiotic No   Opioid No   Antiplatelet No   Hypoglycemics/insulin Yes SSI aspart - DM  Vasoactive Medication No   Chemotherapy No   Other Yes Acetaminophen - pain Bisacodyl, senokot-S, fleet enema - bowel regimen, constipation Calcium  -supplement ,  Levothyroxine - hypothyroidism Melatonin - sleep Nitroglycerin  SL- chest pain Pantoprazole  - GERD Rosuvastatin  - HLD     Type of Medication Issue Identified Description of Issue Recommendation(s)  Drug Interaction(s) (clinically significant)     Duplicate Therapy     Allergy     No Medication Administration End Date     Incorrect Dose     Additional Drug Therapy Needed     Significant med changes from prior encounter (inform family/care partners about these prior to discharge). PTA medications:  vitamin D3 was not resumed on CIR admission   PTA medications:  BC Headache powder, and Krill omega-3 were discontinued on [facility] acute encounter discharge.    Restart PTA meds when and if necessary during CIR admission or at time of discharge, if warranted   Communicate medication changes to patient /family/ caregiver prior to discharge.   Other       Clinically significant medication issues were identified that warrant physician communication and completion of prescribed/recommended actions by midnight of the next day:  No  Name of provider notified for urgent issues identified:   Provider Method of Notification:    Pharmacist comments:   Time spent performing this drug regimen review (minutes):  25  Abigail Ryan, RPh Clinical  Pharmacist 11/10/2024 11:50 AM

## 2024-11-10 NOTE — Plan of Care (Signed)
  Problem: RH Balance Goal: LTG: Patient will maintain dynamic sitting balance (OT) Description: LTG:  Patient will maintain dynamic sitting balance with assistance during activities of daily living (OT) Flowsheets (Taken 11/10/2024 1428) LTG: Pt will maintain dynamic sitting balance during ADLs with: Independent Goal: LTG Patient will maintain dynamic standing with ADLs (OT) Description: LTG:  Patient will maintain dynamic standing balance with assist during activities of daily living (OT)  Flowsheets (Taken 11/10/2024 1428) LTG: Pt will maintain dynamic standing balance during ADLs with: Independent with assistive device   Problem: Sit to Stand Goal: LTG:  Patient will perform sit to stand in prep for activites of daily living with assistance level (OT) Description: LTG:  Patient will perform sit to stand in prep for activites of daily living with assistance level (OT) Flowsheets (Taken 11/10/2024 1428) LTG: PT will perform sit to stand in prep for activites of daily living with assistance level: Independent with assistive device   Problem: RH Eating Goal: LTG Patient will perform eating w/assist, cues/equip (OT) Description: LTG: Patient will perform eating with assist, with/without cues using equipment (OT) Flowsheets (Taken 11/10/2024 1428) LTG: Pt will perform eating with assistance level of: Independent   Problem: RH Grooming Goal: LTG Patient will perform grooming w/assist,cues/equip (OT) Description: LTG: Patient will perform grooming with assist, with/without cues using equipment (OT) Flowsheets (Taken 11/10/2024 1428) LTG: Pt will perform grooming with assistance level of: Independent with assistive device    Problem: RH Bathing Goal: LTG Patient will bathe all body parts with assist levels (OT) Description: LTG: Patient will bathe all body parts with assist levels (OT) Flowsheets (Taken 11/10/2024 1428) LTG: Pt will perform bathing with assistance level/cueing: Independent with  assistive device  LTG: Position pt will perform bathing: Shower   Problem: RH Dressing Goal: LTG Patient will perform upper body dressing (OT) Description: LTG Patient will perform upper body dressing with assist, with/without cues (OT). Flowsheets (Taken 11/10/2024 1428) LTG: Pt will perform upper body dressing with assistance level of: Independent Goal: LTG Patient will perform lower body dressing w/assist (OT) Description: LTG: Patient will perform lower body dressing with assist, with/without cues in positioning using equipment (OT) Flowsheets (Taken 11/10/2024 1428) LTG: Pt will perform lower body dressing with assistance level of: Minimal Assistance - Patient > 75%   Problem: RH Toileting Goal: LTG Patient will perform toileting task (3/3 steps) with assistance level (OT) Description: LTG: Patient will perform toileting task (3/3 steps) with assistance level (OT)  Flowsheets (Taken 11/10/2024 1428) LTG: Pt will perform toileting task (3/3 steps) with assistance level: Independent with assistive device   Problem: RH Toilet Transfers Goal: LTG Patient will perform toilet transfers w/assist (OT) Description: LTG: Patient will perform toilet transfers with assist, with/without cues using equipment (OT) Flowsheets (Taken 11/10/2024 1428) LTG: Pt will perform toilet transfers with assistance level of: Independent with assistive device   Problem: RH Tub/Shower Transfers Goal: LTG Patient will perform tub/shower transfers w/assist (OT) Description: LTG: Patient will perform tub/shower transfers with assist, with/without cues using equipment (OT) Flowsheets (Taken 11/10/2024 1428) LTG: Pt will perform tub/shower stall transfers with assistance level of: Supervision/Verbal cueing LTG: Pt will perform tub/shower transfers from: Tub/shower combination

## 2024-11-10 NOTE — Evaluation (Signed)
 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 She was found to have sensory deficits on left face also. CT head negative and TNKase  administered. MRI brain done revealing vague area of restricted diffusion within white matter and posterior limb right internal capsule c/w acute or subacute small vessel infarct and mild to moderate small vessel disease Patient transferred to CIR on 11/09/2024 .    Patient currently requires mod with basic self-care skills secondary to decreased cardiorespiratoy endurance, impaired timing and sequencing, abnormal tone, unbalanced muscle activation, decreased coordination, and decreased motor planning, decreased midline orientation, and decreased sitting balance, decreased standing balance, decreased postural control, hemiplegia, and decreased balance strategies.  Prior to hospitalization, patient could complete BADL/IADL with independent .  Patient will benefit from skilled intervention to decrease level  of assist with basic self-care skills and increase independence with basic self-care skills prior to discharge home with care partner.  Anticipate patient will require intermittent supervision and follow up outpatient.  OT - End of Session Activity Tolerance: Improving Endurance Deficit: Yes OT Assessment Rehab Potential (ACUTE ONLY): Good OT Barriers to Discharge: Medication compliance OT Barriers to Discharge Comments: has limited coverage for insurance for medications OT Patient demonstrates impairments in the following area(s): Balance;Safety;Sensory;Edema;Endurance;Motor OT Basic ADL's Functional Problem(s): Eating;Grooming;Bathing;Dressing;Toileting OT Transfers Functional Problem(s): Toilet;Tub/Shower OT Additional Impairment(s): Fuctional Use of Upper Extremity OT Plan OT Intensity: Minimum of 1-2 x/day, 45 to 90 minutes OT Frequency: 5 out of 7 days OT Duration/Estimated Length of Stay: 12-14 days OT Treatment/Interventions: Balance/vestibular training;Discharge planning;Functional electrical stimulation;Self Care/advanced ADL retraining;Therapeutic Activities;UE/LE Coordination activities;Disease mangement/prevention;Functional mobility training;Patient/family education;Therapeutic Exercise;Skin care/wound managment;DME/adaptive equipment instruction;Neuromuscular re-education;Splinting/orthotics;UE/LE Strength taining/ROM OT Self Feeding Anticipated Outcome(s): Independent OT Basic Self-Care Anticipated Outcome(s): Supervision OT Toileting Anticipated Outcome(s): Mod I OT Bathroom Transfers Anticipated Outcome(s): Mod I OT Recommendation Recommendations for Other Services: None Patient destination: Home Follow Up Recommendations: Outpatient OT Equipment Recommended: To be determined   OT Evaluation Precautions/Restrictions  Precautions Precautions: Fall Recall of Precautions/Restrictions: Intact Restrictions Weight Bearing Restrictions Per Provider Order: No Pain Pain  Assessment Pain Score: 0-No pain Home Living/Prior Functioning Home Living Family/patient expects to be discharged to:: Private residence Living Arrangements: Alone Available Help at Discharge: Family, Friend(s), Available 24 hours/day Type of Home: House Home Access: Stairs to enter [employer] of Steps: 3 Entrance Stairs-Rails: Right, Left Home Layout: One level Bathroom Shower/Tub: Engineer, Manufacturing Systems: Standard Bathroom Accessibility: Yes Additional Comments: pet dog - Dolly  Lives With: Alone (daughter  plans on moving in upon d/c) IADL History Homemaking Responsibilities: Yes Meal Prep Responsibility: Primary Laundry Responsibility: Primary Current License: Yes Mode of Transportation: Car Occupation: Retired 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 [employer] and Hobbies: scientist, physiological, talk on the phone, active in her church, '' friendly club Prior Function Level of Independence: Independent with basic ADLs  Able to Take Stairs?: Yes Driving: Yes Vocation: Retired Vision Baseline Vision/History: 0 No visual deficits (B cataracts surgery January 2025) Ability to See in Adequate Light: 0 Adequate Patient Visual Report: No change from baseline Vision Assessment?: Wears glasses for reading Perception   WFL Praxis Praxis: WFL Cognition Cognition Overall Cognitive Status: Within Functional Limits for tasks assessed Arousal/Alertness: Awake/alert Memory: Appears intact Attention: Selective Selective Attention: Appears intact Awareness: Appears intact Safety/Judgment: Appears intact Brief Interview for Mental Status (BIMS) Repetition of Three Words (First Attempt): 3 Temporal Orientation: Year: Correct Temporal Orientation: Month: Accurate within 5 days Temporal Orientation: Day: Correct Recall: Sock: Yes, no cue required Recall: Blue: Yes, no cue required Recall: Bed: Yes, no cue required BIMS Summary Score:  15 Sensation Sensation Light Touch: Appears Intact Hot/Cold: Not tested Proprioception: Not tested Stereognosis: Not tested Coordination Gross Motor Movements are Fluid and Coordinated: Yes Fine Motor Movements are Fluid and Coordinated: No Coordination and Movement Description: toe tapping whille assessing B DF uncoordinated Motor  Motor Motor: Hemiplegia  Trunk/Postural Assessment  Cervical Assessment Cervical Assessment: Within Functional Limits Thoracic Assessment Thoracic Assessment: Within Functional Limits Lumbar Assessment Lumbar Assessment: Exceptions to [place] Postural Control Postural Control: Deficits on evaluation Trunk Control: posterior bias in sitting and standing  Balance Balance Balance Assessed: Yes Static Sitting Balance Static Sitting - Balance Support: No upper extremity supported;Feet supported Static Sitting - Level of Assistance: 4: Min assist Dynamic Sitting Balance Dynamic Sitting - Balance Support: Feet supported;During functional activity Dynamic Sitting - Level of Assistance: 4: Min assist Dynamic Sitting - Balance Activities: Lateral lean/weight shifting Static Standing Balance Static Standing - Balance Support: During functional activity;Bilateral upper extremity supported Static Standing - Level of Assistance: 3: Mod assist Dynamic Standing Balance Dynamic Standing - Balance Support: During functional activity;Bilateral upper extremity supported Dynamic Standing - Level of Assistance: 2: Max assist Extremity/Trunk Assessment RUE Assessment RUE Assessment: Within Functional Limits    Care Tool Care Tool Self Care Eating Eating activity did not occur: Refused      Oral Care    Oral Care Assist Level: Minimal Assistance - Patient > 75%    Bathing   Body parts bathed by patient: Left arm;Chest;Abdomen;Front perineal area;Right upper leg;Left upper leg;Face Body parts bathed by helper: Buttocks;Right arm;Right lower leg;Left lower leg    Assist Level: Moderate Assistance - Patient 50 - 74%    Upper Body Dressing(including orthotics)   What is the patient wearing?: Pull over shirt   Assist Level: Contact Guard/Touching assist    Lower Body Dressing (excluding footwear)   What is the patient wearing?: Underwear/pull up;Pants Assist for lower body dressing: Moderate Assistance - Patient 50 - 74%    Putting on/Taking off footwear   What is the patient wearing?: Socks Assist for footwear: Total Assistance - Patient < 25%       Care Tool Toileting Toileting activity Toileting Activity did not occur (Clothing management and hygiene only): N/A (no void or bm)       Care Tool Bed Mobility Roll left and right activity   Roll left and right assist level: Moderate Assistance - Patient  50 - 74%    Sit to lying activity   Sit to lying assist level: Moderate Assistance - Patient 50 - 74%    Lying to sitting on side of bed activity   Lying to sitting on side of bed assist level: the ability to move from lying on the back to sitting on the side of the bed with no back support.: Moderate Assistance - Patient 50 - 74%     Care Tool Transfers Sit to stand transfer   Sit to stand assist level: Moderate Assistance - Patient 50 - 74%    Chair/bed transfer   Chair/bed transfer assist level: Moderate Assistance - Patient 50 - 74%     Toilet transfer Toilet transfer activity did not occur: Refused       Care Tool Cognition  Expression of Ideas and Wants Expression of Ideas and Wants: 4. Without difficulty (complex and basic) - expresses complex messages without difficulty and with speech that is clear and easy to understand  Understanding Verbal and Non-Verbal Content Understanding Verbal and Non-Verbal Content: 4. Understands (complex and basic) - clear comprehension without cues or repetitions   Memory/Recall Ability Memory/Recall Ability : Current season;Staff names and faces;That he or she is in a hospital/hospital unit    Refer to Care Plan for Long Term Goals  SHORT TERM GOAL WEEK 1 OT Short Term Goal 1 (Week 1): Patient will transfer to  shower with min assist OT Short Term Goal 2 (Week 1): Patient will transfer to toilet with min assist OT Short Term Goal 3 (Week 1): Patient will don LB clothing- pants/ underwear with min assist OT Short Term Goal 4 (Week 1): Patient will use left hand as gross assist to RUE during BADL  Recommendations for other services: None    Skilled Therapeutic Intervention: Patient received supine in bed, awake and alert and listening to [place] music.  Patient eager to get started with therapy.  Patient clearly able to state deficits related to stroke, and impact on discharge planning.  Initially planned to have patient shwer, however, patient slightly light headed once up in chair, so will hold off for now.  Patient bathed and dressed herself at sink as indicated below.  Patient needs assist to stand to control left knee/ hip.   Patient left up in wheelchair at end of session with safety belt in place and engaged and call bell/ personal items in reach.   ADL ADL Eating: Not assessed Grooming: Minimal assistance Where Assessed-Grooming: Sitting at sink Upper Body Bathing: Minimal assistance Where Assessed-Upper Body Bathing: Sitting at sink Lower Body Bathing: Moderate assistance Where Assessed-Lower Body Bathing: Sitting at sink;Standing at sink Upper Body Dressing: Minimal assistance Where Assessed-Upper Body Dressing: Sitting at sink Lower Body Dressing: Moderate assistance Where Assessed-Lower Body Dressing: Sitting at sink;Standing at sink Toileting: Unable to assess Tub/Shower Transfer: Unable to assess Psychologist, Counselling Transfer: Unable to assess Film/video Editor Method: Unable to assess Mobility  Bed Mobility Bed Mobility: Rolling Right;Rolling Left;Sit to Supine;Supine to Sit Rolling Right: Moderate Assistance - Patient 50-74% Rolling Left: Moderate  Assistance - Patient 50-74% Supine to Sit: Moderate Assistance - Patient 50-74% Sit to Supine: Moderate Assistance - Patient 50-74% Transfers Sit to Stand: Moderate Assistance - Patient 50-74% Stand to Sit: Moderate Assistance - Patient 50-74%   Discharge Criteria: Patient will be discharged from OT if patient refuses treatment 3 consecutive times without medical reason, if treatment goals not met, if there is a change in medical status, if  patient makes no progress towards goals or if patient is discharged from hospital.  The above assessment, treatment plan, treatment alternatives and goals were discussed and mutually agreed upon: by patient  Iyonnah Ferrante M 11/10/2024, 1:04 PM

## 2024-11-10 NOTE — Progress Notes (Signed)
 Physical Therapy Session Note  Patient Details  Name: Abigail Ryan MRN: [id number] Date of Birth: 1946-10-18  Today's Date: 11/10/2024 PT Individual Time: 1315-1343 PT Individual Time Calculation (min): 28 min   Short Term Goals: Week 1:  PT Short Term Goal 1 (Week 1): Pt will complete 5xSTS with modA consistenly with LRAD and without cueing PT Short Term Goal 2 (Week 1): Pt will be able to standing with LRAD for 1 minute with CGA PT Short Term Goal 3 (Week 1): Pt will ambulated 22ft with LRAD and min-modA  Skilled Therapeutic Interventions/Progress Updates:  Pt presents sitting in wheelchair with family (daughter, granddaughter and family friend) visiting. Pt has no complaints of pain.   Focused session on continued gait training with emphasis on NMR.   Pt transported at w/c level for energy conservation to main rehab hallway with hand rail on her R side. Sit<>stand with minA with cues for pushing through armrests bilaterally rather than pulling herself to stand.   Gait training NMR 3x30' (seated rest breaks b/w sets) with minA. PT on stool to help facilitate LLE to manage buckling vs hyperextension. Pt initially ambulates with a step-to pattern, worked on progressing to reciprocal stepping to improve hip extension and more natural gait cycle.   Finished session with side stepping to the R x30' using handrail with BUE. MinA for this as well with PT guarding her L knee for added stability. Cues for lifting and stepping rather than dragging her L foot while side stepping.   Pt returned to her room, left sitting in wheelchair with seat belt alarm on, call bell in reach. Pt pleased with her progress!  Therapy Documentation Precautions:  Precautions Precautions: Fall Recall of Precautions/Restrictions: Intact Restrictions Weight Bearing Restrictions Per Provider Order: No General:    Therapy/Group: Individual Therapy  Franklyn Cafaro P Anurag Scarfo 11/10/2024, 1:42 PM

## 2024-11-11 DIAGNOSIS — I639 Cerebral infarction, unspecified: Secondary | ICD-10-CM | POA: Diagnosis not present

## 2024-11-11 LAB — GLUCOSE, CAPILLARY
Glucose-Capillary: 119 mg/dL — ABNORMAL HIGH (ref 70–99)
Glucose-Capillary: 110 mg/dL — ABNORMAL HIGH (ref 70–99)
Glucose-Capillary: 122 mg/dL — ABNORMAL HIGH (ref 70–99)
Glucose-Capillary: 127 mg/dL — ABNORMAL HIGH (ref 70–99)

## 2024-11-11 LAB — BASIC METABOLIC PANEL WITH GFR
Anion gap: 10 (ref 5–15)
BUN: 22 mg/dL (ref 8–23)
CO2: 27 mmol/L (ref 22–32)
Calcium: 8.9 mg/dL (ref 8.9–10.3)
Chloride: 98 mmol/L (ref 98–111)
Creatinine, Ser: 1.01 mg/dL — ABNORMAL HIGH (ref 0.44–1.00)
GFR, Estimated: 57 mL/min — ABNORMAL LOW (ref >60)
Glucose, Bld: 128 mg/dL — ABNORMAL HIGH (ref 70–99)
Potassium: 4.5 mmol/L (ref 3.5–5.1)
Sodium: 135 mmol/L (ref 135–145)

## 2024-11-11 LAB — CBC
HCT: 42.6 % (ref 36.0–46.0)
Hemoglobin: 14.1 g/dL (ref 12.0–15.0)
MCH: 29.1 pg (ref 26.0–34.0)
MCHC: 33.1 g/dL (ref 30.0–36.0)
MCV: 87.8 fL (ref 80.0–100.0)
Platelets: 226 K/uL (ref 150–400)
RBC: 4.85 MIL/uL (ref 3.87–5.11)
RDW: 13.5 % (ref 11.5–15.5)
WBC: 8.5 K/uL (ref 4.0–10.5)
nRBC: 0 % (ref 0.0–0.2)

## 2024-11-11 MED ORDER — CEPHALEXIN 250 MG PO CAPS
500.0000 mg | ORAL_CAPSULE | Freq: Two times a day (BID) | ORAL | Status: DC
  Administered 2024-11-11 – 2024-11-14 (×7): 500 mg via ORAL
  Filled 2024-11-11 (×7): qty 2

## 2024-11-11 NOTE — Progress Notes (Signed)
 Physical Therapy Session Note  Patient Details  Name: Abigail Ryan MRN: [id number] Date of Birth: Oct 24, 1946  Today's Date: 11/11/2024 PT Individual Time: 1000-1100 and 1430-1520 PT Individual Time Calculation (min): 60 min and 50 min  Short Term Goals: Week 1:  PT Short Term Goal 1 (Week 1): Pt will complete 5xSTS with modA consistenly with LRAD and without cueing PT Short Term Goal 2 (Week 1): Pt will be able to standing with LRAD for 1 minute with CGA PT Short Term Goal 3 (Week 1): Pt will ambulated 30ft with LRAD and min-modA  Skilled Therapeutic Interventions/Progress Updates: Pt presented in w/c agreeable to therapy. Pt c/o mild pain in B shoulders unrated, no intervention requested. Pt transported to day room and completed ambulatory transfer to mat with RW and CGA. Participated in Sit to stand with RW blcok practice for BLE strengthening. Participated in x 2 rounds of corn hole first bout with RW, second bout with minimal use of RW for increased balance challenge. Pt also participated in standing bimanual task using peg board with no LOB but pt c/o increased LE fatigue. Pt then performed therapeutic activity cleaning pegs while sitting. Pt was able to don gloves with increased time and clean pegs with mild increased effort. Pt completed ambulatory transfer to w/c in same manner as prior and transported back to room. Pt requesting to use bathroom. Pt completed ambulatory transfer with RW and CGA to toilet and was able to complete clothing management with supervision and increased time. Pt with continent urinary void and completed peri-care with supervision. Pt stood and completed LB clothing management with minA and increased time. Pt ambulated to bed with CGA and completed sit to supine with minA and increased time for BLE management. Pt repositioned to comfort and left in bed with bed alarm on, call bell within reach and needs met.   Tx2: Pt presented in bed with NT present agreeable to  therapy. Pt c/o B shoulder pain, no intervention requested. Pt completed bed mobility with minA and use of bed features. Pt expressed family had brought shoes. PTA obtained and donned shoes total A for time management. Pt requesting to use bathroom. Required minA to stand from lowered bed and ambulated with minA to toilet. Pt required minA for clothing management due to urgency. Pt with continent urinary void. Stood with CGA and required minA for acupuncturist. Pt then ambulated with CGA ~54ft with verbal cues for increased R step length. Pt transported remaining distance to day room and completed ambualtlory transfer in same manner as prior to NuStep. Participated in NuStep x 15 min total: L3 x 4 min L6 x 5 min L4 x 3 and L3 x 3 min. Pt indicated 8/10 on mBORG with activity. Pt completed transfer in same manner back to w/c and transported back to room. Pt remained in w/c at end of session and left with call bell within reach and needs met.      Therapy Documentation Precautions:  Precautions Precautions: Fall Recall of Precautions/Restrictions: Intact Restrictions Weight Bearing Restrictions Per Provider Order: No General:   Vital Signs: Therapy Vitals Temp: 98.3 F (36.8 C) Temp Source: Oral Pulse Rate: 82 Resp: 17 BP: 136/71 Patient Position (if appropriate): Lying Oxygen Therapy SpO2: 96 % O2 Device: Room Air Pain:   Mobility:   Locomotion :    Trunk/Postural Assessment :    Balance:   Exercises:   Other Treatments:      Therapy/Group: Individual Therapy  Rizwan Kuyper 11/11/2024,  4:16 PM

## 2024-11-11 NOTE — Plan of Care (Signed)
  Problem: Consults Goal: RH STROKE PATIENT EDUCATION Description: See Patient Education module for education specifics  Outcome: Progressing   Problem: RH SAFETY Goal: RH STG ADHERE TO SAFETY PRECAUTIONS W/ASSISTANCE/DEVICE Description: STG Adhere to Safety Precautions With cues Assistance/Device. Outcome: Progressing   Problem: RH KNOWLEDGE DEFICIT Goal: RH STG INCREASE KNOWLEDGE OF DIABETES Description: Patient and family will be able to manage DM using educational resources for medications and dietary modification independently Outcome: Progressing Goal: RH STG INCREASE KNOWLEDGE OF HYPERTENSION Description: Patient and family will be able to manage HTN using educational resources for medications and dietary modification independently Outcome: Progressing Goal: RH STG INCREASE KNOWLEGDE OF HYPERLIPIDEMIA Description: Patient and family will be able to manage HLD using educational resources for medications and dietary modification independently Outcome: Progressing Goal: RH STG INCREASE KNOWLEDGE OF STROKE PROPHYLAXIS Description: Patient and family will be able to manage secondary risks using educational resources for medications and dietary modification independently Outcome: Progressing   Problem: Education: Goal: Ability to describe self-care measures that may prevent or decrease complications (Diabetes Survival Skills Education) will improve Outcome: Progressing Goal: Individualized Educational Video(s) Outcome: Progressing   Problem: Coping: Goal: Ability to adjust to condition or change in health will improve Outcome: Progressing   Problem: Fluid Volume: Goal: Ability to maintain a balanced intake and output will improve Outcome: Progressing   Problem: Health Behavior/Discharge Planning: Goal: Ability to identify and utilize available resources and services will improve Outcome: Progressing Goal: Ability to manage health-related needs will improve Outcome:  Progressing   Problem: Metabolic: Goal: Ability to maintain appropriate glucose levels will improve Outcome: Progressing   Problem: Nutritional: Goal: Maintenance of adequate nutrition will improve Outcome: Progressing Goal: Progress toward achieving an optimal weight will improve Outcome: Progressing   Problem: Skin Integrity: Goal: Risk for impaired skin integrity will decrease Outcome: Progressing   Problem: Tissue Perfusion: Goal: Adequacy of tissue perfusion will improve Outcome: Progressing

## 2024-11-11 NOTE — Progress Notes (Signed)
 Occupational Therapy Session Note  Patient Details  Name: Abigail Ryan MRN: [id number] Date of Birth: [birth year]  Today's Date: 11/11/2024 OT Individual Time: 0730-0800 OT Individual Time Calculation (min): 30 min    Short Term Goals: Week 1:  OT Short Term Goal 1 (Week 1): Patient will transfer to  shower with min assist OT Short Term Goal 2 (Week 1): Patient will transfer to toilet with min assist OT Short Term Goal 3 (Week 1): Patient will don LB clothing- pants/ underwear with min assist OT Short Term Goal 4 (Week 1): Patient will use left hand as gross assist to RUE during BADL  Skilled Therapeutic Interventions/Progress Updates:   Patient received supine in bed indicating urgent need to void.  Used Stedy to assist patient to bathroom for expediency.  Patient had continent void on toilet.  Patient needing assist for clothing management and thoroughness with hygiene.   Patient placed in wheelchair and washed self at sink, donned clothing, and completed grooming.  Able to stand at sink for brief periods with min assist.  Required mod assist yesterday. Patient left up in chair with safety belt in place and engaged.    Therapy Documentation Precautions:  Precautions Precautions: Fall Recall of Precautions/Restrictions: Intact Restrictions Weight Bearing Restrictions Per Provider Order: No   Pain:  Denies pain    Therapy/Group: Individual Therapy  Wilbon Obenchain M 11/11/2024, 12:53 PM

## 2024-11-11 NOTE — Progress Notes (Signed)
 Patient ID: Abigail Ryan, female   DOB: [birth year], 78 y.o.   MRN: [id number]  Statement of service delivered.

## 2024-11-11 NOTE — Progress Notes (Signed)
 Physical Therapy Session Note  Patient Details  Name: Abigail Ryan MRN: [id number] Date of Birth: 1946-06-27  Today's Date: 11/11/2024 PT Individual Time: 8399-8345 PT Individual Time Calculation (min): 54 min   Short Term Goals: Week 1:  PT Short Term Goal 1 (Week 1): Pt will complete 5xSTS with modA consistenly with LRAD and without cueing PT Short Term Goal 2 (Week 1): Pt will be able to standing with LRAD for 1 minute with CGA PT Short Term Goal 3 (Week 1): Pt will ambulated 76ft with LRAD and min-modA  Skilled Therapeutic Interventions/Progress Updates:      Pt presents sitting in w/c, ready for her therapy. Pt reports fatigue as she's starting her 4th hour of PT treatment. Pt has no complaints of pain.  Transported patient at w/c level for energy conservation from her room to the main gym. Sit<>Stand to RW with CGA with cues for pushing through armrests rather than pulling from RW.  Gait training 3x70' (seated rest breaks) with RW and CGA. Pt had a few instances of L knee instability (buckling vs hyperextension) that were self corrected. Occurred more towards end of 76' as she became fatigued.   Pt participated in standing there-ex in // bars for functional strengthening of BLE: -2x12 standing heel raises -2x12 mini squats with emphasis on knee stability and control of LLE -1x12 box squats with emphasis on eccentric lowering -1x8 step ups with 6 block and R foot leading ascent and L leading leading descent.   Pt returned to her room and she was assisted to bed with minA stand pivot transfer. MinA for returning to supine for LLE support. Left with needs met, alarm on.     Therapy Documentation Precautions:  Precautions Precautions: Fall Recall of Precautions/Restrictions: Intact Restrictions Weight Bearing Restrictions Per Provider Order: No General:     Therapy/Group: Individual Therapy  Abigail Ryan 11/11/2024, 7:39 AM

## 2024-11-11 NOTE — Progress Notes (Signed)
 Physical Therapy Session Note  Patient Details  Name: Abigail Ryan MRN: [id number] Date of Birth: 1946-04-15  Today's Date: 11/11/2024 PT Individual Time: 9154-9054 PT Individual Time Calculation (min): 60 min   Short Term Goals: Week 1:  PT Short Term Goal 1 (Week 1): Pt will complete 5xSTS with modA consistenly with LRAD and without cueing PT Short Term Goal 2 (Week 1): Pt will be able to standing with LRAD for 1 minute with CGA PT Short Term Goal 3 (Week 1): Pt will ambulated 48ft with LRAD and min-modA  Skilled Therapeutic Interventions/Progress Updates:     Pt seated in WC and agreeable to therapy upon PT arrival. Pt reports mod R shld pain but says I'm okay. Expresses not getting much sleep last night d/t daughter moving in today.  PT transported pt to main gym via WC. Pt performed stand-pivot transfer with HHA to mat. Completed sit>stand x10 with minA, WC not necessary for UE support when in standing. Min cueing providing to stand tall and pick head up upon standing.   Pt ambulated ~62ft from mat to parallel bars with HHA and performed side stepping over colored spots with CGA from PT and BUE on bars, 34ftx3. Seated reaks breaks provided throughout session. PT observed min L knee buckling and mod difficulty picking up LE, L>R. Pt then completed standing alt toe tapping onto cone in parallel bars with BUE and CGA for safety, no buckling noted.  PT ambulated 29ft x2 with RW with PT providing min knee block when necessary. Cueing provided for heel->toe and emphasis on step to gait pattern. Pt very excited about her progress in just 2 days of therapy but also expresses being very tired d/t lack of sleep and increased exercise.   Session concluded on NuStep on following settings: 3R, 65stm.  PT transported pt back to room via WC. Pt requested being taken to the bathroom. Transferred pt from [facility] to stedy with min-modA and took her to bathroom. D/t lack of time, PT instructed pt to pull nsg  string to notify staff when finished. PT also communicated with staff upon leaving room that pt was left seated on toilet.    Therapy Documentation Precautions:  Precautions Precautions: Fall Recall of Precautions/Restrictions: Intact Restrictions Weight Bearing Restrictions Per Provider Order: No  Therapy/Group: Individual Therapy  Marinda Tyer SPT 11/11/2024, 12:06 PM

## 2024-11-11 NOTE — Progress Notes (Signed)
 PROGRESS NOTE   Subjective/Complaints: No new complaints this morning LBM documented is 12/2- messaged nursing to confirm accuracy Cr improved  ROS: LBM 12/2, +urinary urgency   Objective:   No results found. Recent Labs    11/10/24 0518 11/11/24 0527  WBC 7.8 8.5  HGB 14.0 14.1  HCT 42.2 42.6  PLT 242 226   Recent Labs    11/10/24 0518 11/11/24 0527  NA 138 135  K 4.6 4.5  CL 97* 98  CO2 28 27  GLUCOSE 130* 128*  BUN 21 22  CREATININE 1.08* 1.01*  CALCIUM  9.4 8.9    Intake/Output Summary (Last 24 hours) at 11/11/2024 1207 Last data filed at 11/11/2024 0800 Gross per 24 hour  Intake 580 ml  Output 650 ml  Net -70 ml        Physical Exam: Vital Signs Blood pressure 123/60, pulse 83, temperature 98.2 F (36.8 C), temperature source Oral, resp. rate 18, height 5' 4 (1.626 m), weight 108.7 kg, SpO2 96%. Gen: no distress, normal appearing HENT:     Head: Normocephalic and atraumatic.     Comments: Mild L facial droop Tongue midline Facial sensation questionably less on L- she reports it's intermittent    Right Ear: External ear normal.     Left Ear: External ear normal.     Nose: Nose normal. No congestion.     Mouth/Throat:     Mouth: Mucous membranes are dry.     Pharynx: No oropharyngeal exudate.  Eyes:     General:        Right eye: No discharge.        Left eye: No discharge.     Extraocular Movements: Extraocular movements intact.     Comments: No nystagmus  Skin:    General: Skin is warm and dry.     Findings: Bruising present.     Comments: A lot of large purple bruising in Ue's- B/L from IV sticks and blood draws Midline R upper arm  Neurological:     Mental Status: She is alert.     Comments: Ox3 Questionable mild sensory deficit of L side of face No hoffman's Intact to light touch in x4 extremities No increased tone or clonus Intact naming and repetition, stable 12/4    Psychiatric:        Mood and Affect: Mood normal.        Behavior: Behavior normal.      Assessment/Plan: 1. Functional deficits which require 3+ hours per day of interdisciplinary therapy in a comprehensive inpatient rehab setting. Physiatrist is providing close team supervision and 24 hour management of active medical problems listed below. Physiatrist and rehab team continue to assess barriers to discharge/monitor patient progress toward functional and medical goals  Care Tool:  Bathing    Body parts bathed by patient: Left arm, Chest, Abdomen, Front perineal area, Right upper leg, Left upper leg, Face   Body parts bathed by helper: Buttocks, Right arm, Right lower leg, Left lower leg     Bathing assist Assist Level: Moderate Assistance - Patient 50 - 74%     Upper Body Dressing/Undressing Upper body dressing   What is the  patient wearing?: Pull over shirt    Upper body assist Assist Level: Contact Guard/Touching assist    Lower Body Dressing/Undressing Lower body dressing      What is the patient wearing?: Underwear/pull up, Pants     Lower body assist Assist for lower body dressing: Moderate Assistance - Patient 50 - 74%     Toileting Toileting Toileting Activity did not occur (Clothing management and hygiene only): N/A (no void or bm)  Toileting assist Assist for toileting: Moderate Assistance - Patient 50 - 74%     Transfers Chair/bed transfer  Transfers assist     Chair/bed transfer assist level: Moderate Assistance - Patient 50 - 74%     Locomotion Ambulation   Ambulation assist      Assist level: Maximal Assistance - Patient 25 - 49% Assistive device: Walker-rolling Max distance: 50ft   Walk 10 feet activity   Assist     Assist level: Maximal Assistance - Patient 25 - 49% Assistive device: Walker-rolling   Walk 50 feet activity   Assist Walk 50 feet with 2 turns activity did not occur: Safety/medical concerns          Walk 150 feet activity   Assist Walk 150 feet activity did not occur: Safety/medical concerns         Walk 10 feet on uneven surface  activity   Assist Walk 10 feet on uneven surfaces activity did not occur: Safety/medical concerns         Wheelchair     Assist Is the patient using a wheelchair?: Yes Type of Wheelchair: Manual    Wheelchair assist level: Minimal Assistance - Patient > 75% Max wheelchair distance: 40ft    Wheelchair 50 feet with 2 turns activity    Assist        Assist Level: Minimal Assistance - Patient > 75%   Wheelchair 150 feet activity     Assist      Assist Level: Maximal Assistance - Patient 25 - 49%   Blood pressure 123/60, pulse 83, temperature 98.2 F (36.8 C), temperature source Oral, resp. rate 18, height 5' 4 (1.626 m), weight 108.7 kg, SpO2 96%.  Medical Problem List and Plan: 1. 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  Antithrombotics: -DVT/anticoagulation:  Pharmaceutical: Eliquis - while in hospital- pt doesn't want vs cannot afford to get Medicare D to cover it             -antiplatelet therapy: N/A 3. Pain Management: LCSW to follow for evaluation and support.  4. Mood/Behavior/Sleep: N/A             --Melatonin prn for insomnia.              -antipsychotic agents: N/A 5. Neuropsych/cognition: This patient is capable of making decisions on her own behalf. 6. Skin/Wound Care: Routine pressure relief measures.  7. Fluids/Electrolytes/Nutrition: Monitor I/O. Check CMET in am 8. T2DM: Hgb A1c-6.8 and diet controlled? No meds due to lack of insurance? --FBS 120-130. Will monitor BS ac/hs for trend and use SSI for elevated BS 9. Class III obesity: Educated on appropriate diet and exercise to help promote mobility and health.             --change diet  to  heart  healthy. 10.  A fib: Monitor HR TID  11. Hypothyroid: continue synthroid  for supplement.  12.  Frequency: Discussed UA is mildly positive, f/u UC, Keflex  started given positive UA  13. Hyperlipidemia: Chol- 216. LDL-151. continue Crestor .   14. Low ionized calcium : continue caltrate.     LOS: 2 days A FACE TO FACE EVALUATION WAS PERFORMED  Sven P Yianni Skilling 11/11/2024, 12:07 PM

## 2024-11-12 DIAGNOSIS — I639 Cerebral infarction, unspecified: Secondary | ICD-10-CM | POA: Diagnosis not present

## 2024-11-12 LAB — GLUCOSE, CAPILLARY: Glucose-Capillary: 125 mg/dL — ABNORMAL HIGH (ref 70–99)

## 2024-11-12 MED ORDER — PANTOPRAZOLE SODIUM 40 MG PO TBEC
40.0000 mg | DELAYED_RELEASE_TABLET | Freq: Every day | ORAL | Status: DC
  Administered 2024-11-13: 40 mg via ORAL
  Filled 2024-11-12 (×8): qty 1

## 2024-11-12 NOTE — Progress Notes (Signed)
 Occupational Therapy Session Note  Patient Details  Name: Abigail Ryan MRN: [id number] Date of Birth: 1946-11-11  Today's Date: 11/12/2024 OT Individual Time: 8596-8495 OT Individual Time Calculation (min): 61 min    Short Term Goals: Week 1:  OT Short Term Goal 1 (Week 1): Patient will transfer to  shower with min assist OT Short Term Goal 2 (Week 1): Patient will transfer to toilet with min assist OT Short Term Goal 3 (Week 1): Patient will don LB clothing- pants/ underwear with min assist OT Short Term Goal 4 (Week 1): Patient will use left hand as gross assist to RUE during BADL  Skilled Therapeutic Interventions/Progress Updates:  Pt greeted seated in w/c, pt agreeable to OT intervention.      Transfers/bed mobility/functional mobility:  Pt completed sit>stands throughout session with MINA, pt wanting to attempt to stand with no UE support with pt needing MIN cues for correct set- up of sit>stand technique.    ADLs:  Grooming: pt completed seated grooming at sink with supervision, I.e brushing her hair and applying face cream UB dressing:pt donned [place] shirt with CGA, Min cues needed to recall need to don LUE first  LB dressing: pt donned pants/underwear with MODA needing assist to thread BLEs and pull pants to waist line on L side only  Footwear: donned socks with total A for time mgmt   Bathing: pt completed bathing seated on TTB with overall MODA. Pt needed assist to wash under bilateral arms and to wash her butt while pt stood with unilateral support from grab bar.  Transfers: ambulatory transfer in/out of shower with MINA with RW, min verbal cues needed for sequencing and technique  Education: education provided on motor recovery after stroke.    NMR:  Pt completed various tasks from sitting to facilitate improved LUE strength/coordination with pt instructed to grasp horseshoes and reach across midline to place horseshoes on target with a focus on improved motor planning  and coordination. Pt reports having greatest difficulty when reaching away from her body.   Issue pt cup with small compliant cubes to work on oppositional grasp for LUE for ADL participation. Pt receptive to working on task throughout the weekend.                 Ended session with pt seated in w/c with all needs within reach.   Therapy Documentation Precautions:  Precautions Precautions: Fall Recall of Precautions/Restrictions: Intact Restrictions Weight Bearing Restrictions Per Provider Order: No  Pain: No pain    Therapy/Group: Individual Therapy  Ronal Mallie Needy 11/12/2024, 3:17 PM

## 2024-11-12 NOTE — Progress Notes (Signed)
 Occupational Therapy Session Note  Patient Details  Name: Abigail Ryan MRN: [id number] Date of Birth: 1946/12/03  Today's Date: 11/12/2024 OT Individual Time: 0917-1000 OT Individual Time Calculation (min): 43 min    Short Term Goals: Week 1:  OT Short Term Goal 1 (Week 1): Patient will transfer to  shower with min assist OT Short Term Goal 2 (Week 1): Patient will transfer to toilet with min assist OT Short Term Goal 3 (Week 1): Patient will don LB clothing- pants/ underwear with min assist OT Short Term Goal 4 (Week 1): Patient will use left hand as gross assist to RUE during BADL  Skilled Therapeutic Interventions/Progress Updates:  Patient agreeable to participate in OT session. Reports 0/10 pain level. Patient received sitting in wc. Patient participated in skilled OT session focusing on functional use of UE. Patient received from room. Patient transported to therapy gym to complete fine motor and UE coordination task. OT and patient completed Jenga task utilizing affected UE to remove tiles. Patient able to remove all tiles without knocking over tower with good coordination. Patient completed fine motor task of beads for medication management with good skill. Patient reported/ OT observed increased efficiency in coordination skills during session. Patient returned to room alarm on all needs in reach. .   Therapy Documentation Precautions:  Precautions Precautions: Fall Recall of Precautions/Restrictions: Intact Restrictions Weight Bearing Restrictions Per Provider Order: No  Therapy/Group: Individual Therapy  D'mariea L Kalani Baray 11/12/2024, 7:24 AM

## 2024-11-12 NOTE — IPOC Note (Signed)
 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)     Functional Problem List: Nursing Pain, Endurance, Safety  PT Balance, Behavior, Endurance, Motor, Perception, Safety  OT Balance, Safety, Sensory, Edema, Endurance, Motor  SLP    TR         Basic ADL's: OT Eating, Grooming, Bathing, Dressing, Toileting     Advanced  ADL's: OT       Transfers: PT Bed Mobility, Bed to Chair, Car, Occupational Psychologist, Research Scientist (life Sciences): PT Ambulation, Stairs     Additional Impairments: OT Fuctional Use of Upper Extremity  SLP        TR      Anticipated Outcomes Item Anticipated Outcome  Self Feeding Independent  Swallowing      Basic self-care  Supervision  Toileting  Mod I   Bathroom Transfers Mod I  Bowel/Bladder  n/a  Transfers  supervision  Locomotion  supervision  Communication     Cognition     Pain  n/a  Safety/Judgment  manage safety w cues   Therapy Plan: PT Intensity: Minimum of 1-2 x/day ,45 to 90 minutes PT Frequency: 5 out of 7 days PT Duration Estimated Length of Stay: 14-16 days OT Intensity: Minimum of 1-2 x/day, 45 to 90 minutes OT Frequency: 5 out of 7 days OT Duration/Estimated Length of Stay: 12-14 days     Team Interventions: Nursing Interventions Patient/Family Education, Disease Management/Prevention, Medication Management, Discharge Planning  PT interventions Ambulation/gait training, Discharge planning, Functional mobility training, Psychosocial support, Therapeutic Activities, Balance/vestibular training, Disease management/prevention, Neuromuscular re-education, Therapeutic Exercise, Wheelchair propulsion/positioning, Cognitive remediation/compensation, DME/adaptive equipment instruction, Pain management, Splinting/orthotics,  UE/LE Strength taining/ROM, Community reintegration, Development worker, international aid stimulation, Patient/family education, Museum/gallery curator, UE/LE Coordination activities  OT Interventions Warden/ranger, Discharge planning, Functional electrical stimulation, Self Care/advanced ADL retraining, Therapeutic Activities, UE/LE Coordination activities, Disease mangement/prevention, Functional mobility training, Patient/family education, Therapeutic Exercise, Skin care/wound managment, DME/adaptive equipment instruction, Neuromuscular re-education, Splinting/orthotics, UE/LE Strength taining/ROM  SLP Interventions    TR Interventions    SW/CM Interventions Discharge Planning, Psychosocial Support, Patient/Family Education, Disease Management/Prevention   Barriers to Discharge MD  Medical stability  Nursing Decreased caregiver support, Home environment access/layout 1 level 3 ste bil rails(ramp pending) w family  PT Decreased caregiver support, Weight    OT Medication compliance has limited coverage for insurance for medications  SLP      SW       Team Discharge Planning: Destination: PT-Home ,OT- Home , SLP-  Projected Follow-up: PT-Outpatient PT, OT-  Outpatient OT, SLP-  Projected Equipment Needs: PT-To be determined, OT- To be determined, SLP-  Equipment Details: PT- , OT-  Patient/family involved in discharge planning: PT- Patient,  OT-Patient, SLP-   MD ELOS: 10-12 days Medical Rehab Prognosis:  Excellent Assessment: The patient has been admitted for CIR therapies with the diagnosis of r internal capsule CVA. The team will be addressing functional mobility, strength, stamina, balance, safety, adaptive techniques and equipment, self-care, bowel and bladder mgt, patient and caregiver education. Goals have been set at supervision.  Anticipated discharge destination is home.        See Team Conference Notes for weekly updates to the plan of care

## 2024-11-12 NOTE — Plan of Care (Signed)
  Problem: Consults Goal: RH STROKE PATIENT EDUCATION Description: See Patient Education module for education specifics  Outcome: Progressing   Problem: RH SAFETY Goal: RH STG ADHERE TO SAFETY PRECAUTIONS W/ASSISTANCE/DEVICE Description: STG Adhere to Safety Precautions With cues Assistance/Device. Outcome: Adequate for Discharge

## 2024-11-12 NOTE — Progress Notes (Signed)
 Physical Therapy Session Note  Patient Details  Name: Abigail Ryan MRN: [id number] Date of Birth: 05-Apr-1946  Today's Date: 11/12/2024 PT Individual Time: 1300-1345 PT Individual Time Calculation (min): 45 min   Short Term Goals: Week 1:  PT Short Term Goal 1 (Week 1): Pt will complete 5xSTS with modA consistenly with LRAD and without cueing PT Short Term Goal 2 (Week 1): Pt will be able to standing with LRAD for 1 minute with CGA PT Short Term Goal 3 (Week 1): Pt will ambulated 78ft with LRAD and min-modA  Skilled Therapeutic Interventions/Progress Updates: Pt presented in bed agreeable to therapy. Pt states some increased shoulder pain after lunch, believes it was sitting unsupported during lunch. Pt completed bed mobility with minA and use of bed features. Completed ambulatory transfer to w/c with RW and CGA. Pt transported to day room for energy conservation, completed ambulatory transfer to high/low mat. Pt participated in standing balance/coordination activity performing toe taps to target using cues. Pt with mild decreased L foot clearance which improved with cues. Pt also performed side stepping with red theraband at feet for increased glute med recruitment. Pt then ambulated back to room with RW and CGA. Pt initially requiring cues for increased R step length and demonstrates good carryover when ambulating straight trajectory however significantly shortened step length when turning. In room pt ambulated to toilet and performed LB clothing management with supervision with continent urinary void. Pt stood with CGA and required minA to pull clothing over hips. Pt then ambulated to w/c and remained in w/c at end of session with call bell within reach and needs met.      Therapy Documentation Precautions:  Precautions Precautions: Fall Recall of Precautions/Restrictions: Intact Restrictions Weight Bearing Restrictions Per Provider Order: No General:   Vital Signs:   Pain: Pain  Assessment Pain Scale: 0-10 Pain Score: 0-No pain    Therapy/Group: Individual Therapy  Loras Grieshop 11/12/2024, 2:31 PM

## 2024-11-12 NOTE — Progress Notes (Signed)
 PHARMACIST - PHYSICIAN COMMUNICATION  DR:   Lorilee  CONCERNING: IV to Oral Route Change Policy  RECOMMENDATION: This patient is receiving protonix  by the intravenous route.  Based on criteria approved by the Pharmacy and Therapeutics Committee, the intravenous medication(s) is/are being converted to the equivalent oral dose form(s).   DESCRIPTION: These criteria include: The patient is eating (either orally or via tube) and/or has been taking other orally administered medications for a least 24 hours The patient has no evidence of active gastrointestinal bleeding or impaired GI absorption (gastrectomy, short bowel, patient on TNA or NPO).  If you have questions about this conversion, please contact the Pharmacy Department  []   [phone] )  [facility] []   [phone] )  [facility] [x]   [phone] )  [facility] []   [phone] )  [facility] []   [phone] )  [facility]

## 2024-11-12 NOTE — Care Management (Signed)
 Pt does not want blood sugar checked, states it was discontinued this morning

## 2024-11-12 NOTE — Plan of Care (Signed)
  Problem: Consults Goal: RH STROKE PATIENT EDUCATION Description: See Patient Education module for education specifics  Outcome: Progressing   Problem: RH SAFETY Goal: RH STG ADHERE TO SAFETY PRECAUTIONS W/ASSISTANCE/DEVICE Description: STG Adhere to Safety Precautions With cues Assistance/Device. Outcome: Progressing   Problem: RH KNOWLEDGE DEFICIT Goal: RH STG INCREASE KNOWLEDGE OF DIABETES Description: Patient and family will be able to manage DM using educational resources for medications and dietary modification independently Outcome: Progressing Goal: RH STG INCREASE KNOWLEDGE OF HYPERTENSION Description: Patient and family will be able to manage HTN using educational resources for medications and dietary modification independently Outcome: Progressing Goal: RH STG INCREASE KNOWLEGDE OF HYPERLIPIDEMIA Description: Patient and family will be able to manage HLD using educational resources for medications and dietary modification independently Outcome: Progressing Goal: RH STG INCREASE KNOWLEDGE OF STROKE PROPHYLAXIS Description: Patient and family will be able to manage secondary risks using educational resources for medications and dietary modification independently Outcome: Progressing   Problem: Education: Goal: Ability to describe self-care measures that may prevent or decrease complications (Diabetes Survival Skills Education) will improve Outcome: Progressing Goal: Individualized Educational Video(s) Outcome: Progressing   Problem: Coping: Goal: Ability to adjust to condition or change in health will improve Outcome: Progressing   Problem: Fluid Volume: Goal: Ability to maintain a balanced intake and output will improve Outcome: Progressing   Problem: Health Behavior/Discharge Planning: Goal: Ability to identify and utilize available resources and services will improve Outcome: Progressing Goal: Ability to manage health-related needs will improve Outcome:  Progressing   Problem: Metabolic: Goal: Ability to maintain appropriate glucose levels will improve Outcome: Progressing   Problem: Nutritional: Goal: Maintenance of adequate nutrition will improve Outcome: Progressing Goal: Progress toward achieving an optimal weight will improve Outcome: Progressing   Problem: Skin Integrity: Goal: Risk for impaired skin integrity will decrease Outcome: Progressing   Problem: Tissue Perfusion: Goal: Adequacy of tissue perfusion will improve Outcome: Progressing

## 2024-11-12 NOTE — Progress Notes (Signed)
 Physical Therapy Session Note  Patient Details  Name: Abigail Ryan MRN: [id number] Date of Birth: 05-Dec-1946  Today's Date: 11/12/2024 PT Individual Time: 0800-0900 PT Individual Time Calculation (min): 60 min   Short Term Goals: Week 1:  PT Short Term Goal 1 (Week 1): Pt will complete 5xSTS with modA consistenly with LRAD and without cueing PT Short Term Goal 2 (Week 1): Pt will be able to standing with LRAD for 1 minute with CGA PT Short Term Goal 3 (Week 1): Pt will ambulated 48ft with LRAD and min-modA  Skilled Therapeutic Interventions/Progress Updates:     Pt supine in bed and agreeable to therapy upon arrival. Reports no pain, request to perform minimal self care prior to leaving room.   Pt performs bed mobility supine>sit with HOB elevated independently, request PT to assist with donning sneakers, maxA required. Stand-pivot transfer completed with HHA from bed>WC. Seated in WC, pt brushed teeth and combed hair to prepare for session.   PT transferred pt to dayroom via WC. Stand-pivot transfer with HHA completed from [facility]. PT provided cueing for proper sit>stand without UE assist. Pt able complete x10 from elevated mat, no UE support for warm up.  : 259ft with RW and CGA; no rest breaks taken PT observations - decreased cadence, step/stride length, decreased stance time on LLE. When fatigued, BLE foot drag, pt able to self correct, mod cueing to keep head up.  NuStep: 13 min - 3R ( ) 4R ( ), 65stm, 709 total steps  Pt ambulates from NuStep to WC with RW and CGA 15ft. Taken to room and left seated in [facility], brakes left, belt alarm activated, all needs within reach.  Therapy Documentation Precautions:  Precautions Precautions: Fall Recall of Precautions/Restrictions: Intact Restrictions Weight Bearing Restrictions Per Provider Order: No  Therapy/Group: Individual Therapy  Kazmir Oki SPT 11/12/2024, 8:45 AM

## 2024-11-12 NOTE — Progress Notes (Addendum)
 PROGRESS NOTE   Subjective/Complaints: No new complaints this morning Urgency has improved since starting Keflex  She is pleased with her progress with therapy  ROS: LBM 12/2, +urinary urgency- improved   Objective:   No results found. Recent Labs    11/10/24 0518 11/11/24 0527  WBC 7.8 8.5  HGB 14.0 14.1  HCT 42.2 42.6  PLT 242 226   Recent Labs    11/10/24 0518 11/11/24 0527  NA 138 135  K 4.6 4.5  CL 97* 98  CO2 28 27  GLUCOSE 130* 128*  BUN 21 22  CREATININE 1.08* 1.01*  CALCIUM  9.4 8.9    Intake/Output Summary (Last 24 hours) at 11/12/2024 1236 Last data filed at 11/12/2024 0800 Gross per 24 hour  Intake 720 ml  Output --  Net 720 ml        Physical Exam: Vital Signs Blood pressure 139/72, pulse 75, temperature 97.8 F (36.6 C), temperature source Oral, resp. rate 18, height 5' 4 (1.626 m), weight 108.7 kg, SpO2 98%. Gen: no distress, normal appearing HENT:     Head: Normocephalic and atraumatic.     Comments: Mild L facial droop Tongue midline Facial sensation questionably less on L- she reports it's intermittent    Right Ear: External ear normal.     Left Ear: External ear normal.     Nose: Nose normal. No congestion.     Mouth/Throat:     Mouth: Mucous membranes are dry.     Pharynx: No oropharyngeal exudate.  Eyes:     General:        Right eye: No discharge.        Left eye: No discharge.     Extraocular Movements: Extraocular movements intact.     Comments: No nystagmus  Skin:    General: Skin is warm and dry.     Findings: Bruising present.     Comments: A lot of large purple bruising in Ue's- B/L from IV sticks and blood draws Midline R upper arm  Neurological:     Mental Status: She is alert.     Comments: Ox3 Questionable mild sensory deficit of L side of face No hoffman's Intact to light touch in x4 extremities No increased tone or clonus Intact naming and  repetition, stable 12/5   Psychiatric:        Mood and Affect: Mood normal.        Behavior: Behavior normal.      Assessment/Plan: 1. Functional deficits which require 3+ hours per day of interdisciplinary therapy in a comprehensive inpatient rehab setting. Physiatrist is providing close team supervision and 24 hour management of active medical problems listed below. Physiatrist and rehab team continue to assess barriers to discharge/monitor patient progress toward functional and medical goals  Care Tool:  Bathing    Body parts bathed by patient: Left arm, Chest, Abdomen, Front perineal area, Right upper leg, Left upper leg, Face   Body parts bathed by helper: Buttocks, Right arm, Right lower leg, Left lower leg     Bathing assist Assist Level: Moderate Assistance - Patient 50 - 74%     Upper Body Dressing/Undressing Upper body dressing  What is the patient wearing?: Pull over shirt    Upper body assist Assist Level: Contact Guard/Touching assist    Lower Body Dressing/Undressing Lower body dressing      What is the patient wearing?: Underwear/pull up, Pants     Lower body assist Assist for lower body dressing: Moderate Assistance - Patient 50 - 74%     Toileting Toileting Toileting Activity did not occur (Clothing management and hygiene only): N/A (no void or bm)  Toileting assist Assist for toileting: Moderate Assistance - Patient 50 - 74%     Transfers Chair/bed transfer  Transfers assist     Chair/bed transfer assist level: Moderate Assistance - Patient 50 - 74%     Locomotion Ambulation   Ambulation assist      Assist level: Maximal Assistance - Patient 25 - 49% Assistive device: Walker-rolling Max distance: 26ft   Walk 10 feet activity   Assist     Assist level: Maximal Assistance - Patient 25 - 49% Assistive device: Walker-rolling   Walk 50 feet activity   Assist Walk 50 feet with 2 turns activity did not occur: Safety/medical  concerns         Walk 150 feet activity   Assist Walk 150 feet activity did not occur: Safety/medical concerns         Walk 10 feet on uneven surface  activity   Assist Walk 10 feet on uneven surfaces activity did not occur: Safety/medical concerns         Wheelchair     Assist Is the patient using a wheelchair?: Yes Type of Wheelchair: Manual    Wheelchair assist level: Minimal Assistance - Patient > 75% Max wheelchair distance: 35ft    Wheelchair 50 feet with 2 turns activity    Assist        Assist Level: Minimal Assistance - Patient > 75%   Wheelchair 150 feet activity     Assist      Assist Level: Maximal Assistance - Patient 25 - 49%   Blood pressure 139/72, pulse 75, temperature 97.8 F (36.6 C), temperature source Oral, resp. rate 18, height 5' 4 (1.626 m), weight 108.7 kg, SpO2 98%.  Medical Problem List and Plan: 1. 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  Antithrombotics: -DVT/anticoagulation:  Pharmaceutical: Eliquis - while in hospital- pt doesn't want vs cannot afford to get Medicare D to cover it             -antiplatelet therapy: N/A 3. Pain Management: LCSW to follow for evaluation and support.  4. Mood/Behavior/Sleep: N/A             --Melatonin prn for insomnia.              -antipsychotic agents: N/A 5. Neuropsych/cognition: This patient is capable of making decisions on her own behalf. 6. Skin/Wound Care: Routine pressure relief measures.  7. Constipation: large BM 12/5: d/c prn enema.  8. T2DM: Hgb A1c-6.8 and diet controlled? No meds due to lack of insurance? --FBS 120-130. Will monitor BS ac/hs for trend and use SSI for elevated BS 9. Class III obesity: Educated on appropriate diet and exercise to help promote mobility and health.             --  change diet  to  heart healthy. 10.  A fib: Monitor HR TID  11. Hypothyroid: continue synthroid  for supplement.  12.  Kebsiella UTI: continue Keflex , f/u sensitivities  13. Hyperlipidemia: Chol- 216. LDL-151. D/c crestor  as per patient's request  14. Low ionized calcium : continue caltrate.     LOS: 3 days A FACE TO FACE EVALUATION WAS PERFORMED  Sven SQUIBB Kristalynn Coddington 11/12/2024, 12:36 PM

## 2024-11-13 DIAGNOSIS — I639 Cerebral infarction, unspecified: Secondary | ICD-10-CM | POA: Diagnosis not present

## 2024-11-13 LAB — CULTURE, OB URINE: Culture: 40000 — AB

## 2024-11-13 NOTE — Progress Notes (Signed)
 PROGRESS NOTE   Subjective/Complaints:  No events overnight.  Feels very well today, states the dysuria is essentially gone and she can go longer stretches between urinary bouts. Vitals stable      11/13/2024    3:39 AM 11/12/2024    8:04 PM 11/12/2024    3:14 AM  Vitals with BMI  Systolic 131 124 860  Diastolic 61 62 72  Pulse 74 75 75    Recent Labs    11/11/24 1712 11/11/24 2058 11/12/24 0559  GLUCAP 122* 127* 125*     P.o. intakes appropriate   Continent of bladder   Last BM 12/5   ROS: LBM 12/2, +urinary urgency- improved   Objective:   No results found. Recent Labs    11/11/24 0527  WBC 8.5  HGB 14.1  HCT 42.6  PLT 226   Recent Labs    11/11/24 0527  NA 135  K 4.5  CL 98  CO2 27  GLUCOSE 128*  BUN 22  CREATININE 1.01*  CALCIUM  8.9    Intake/Output Summary (Last 24 hours) at 11/13/2024 1345 Last data filed at 11/13/2024 0900 Gross per 24 hour  Intake 480 ml  Output --  Net 480 ml        Physical Exam: Vital Signs Blood pressure 131/61, pulse 74, temperature 97.8 F (36.6 C), temperature source Oral, resp. rate 16, height 5' 4 (1.626 m), weight 108.7 kg, SpO2 96%. Gen: no distress, normal appearing HENT:     Head: Normocephalic and atraumatic.     Comments: Mild L facial droop Tongue midline Facial sensation questionably less on L- she reports it's intermittent    Right Ear: External ear normal.     Left Ear: External ear normal.     Nose: Nose normal. No congestion.     Mouth/Throat:     Mouth: Mucous membranes are dry.     Pharynx: No oropharyngeal exudate.  Eyes:     General:        Right eye: No discharge.        Left eye: No discharge.     Extraocular Movements: Extraocular movements intact.     Comments: No nystagmus  Skin:    General: Skin is warm and dry.     Findings: Bruising present.     Comments: A lot of large purple bruising in Ue's- B/L from IV sticks  and blood draws Midline R upper arm  Neurological:     Mental Status: She is alert.     Comments: Ox3 Questionable mild sensory deficit of L side of face No hoffman's Intact to light touch in x4 extremities No increased tone or clonus Intact naming and repetition, stable 12/5   Psychiatric:        Mood and Affect: Mood normal.        Behavior: Behavior normal.    Physical exam unchanged from the above on reexamination 11/13/24    Assessment/Plan: 1. Functional deficits which require 3+ hours per day of interdisciplinary therapy in a comprehensive inpatient rehab setting. Physiatrist is providing close team supervision and 24 hour management of active medical problems listed below. Physiatrist and rehab team continue to assess  barriers to discharge/monitor patient progress toward functional and medical goals  Care Tool:  Bathing    Body parts bathed by patient: Left arm, Chest, Abdomen, Front perineal area, Right upper leg, Left upper leg, Face   Body parts bathed by helper: Buttocks, Right arm, Right lower leg, Left lower leg     Bathing assist Assist Level: Moderate Assistance - Patient 50 - 74%     Upper Body Dressing/Undressing Upper body dressing   What is the patient wearing?: Pull over shirt    Upper body assist Assist Level: Contact Guard/Touching assist    Lower Body Dressing/Undressing Lower body dressing      What is the patient wearing?: Underwear/pull up, Pants     Lower body assist Assist for lower body dressing: Moderate Assistance - Patient 50 - 74%     Toileting Toileting Toileting Activity did not occur (Clothing management and hygiene only): N/A (no void or bm)  Toileting assist Assist for toileting: Moderate Assistance - Patient 50 - 74%     Transfers Chair/bed transfer  Transfers assist     Chair/bed transfer assist level: Minimal Assistance - Patient > 75%     Locomotion Ambulation   Ambulation assist      Assist level:  Maximal Assistance - Patient 25 - 49% Assistive device: Walker-rolling Max distance: 42ft   Walk 10 feet activity   Assist     Assist level: Maximal Assistance - Patient 25 - 49% Assistive device: Walker-rolling   Walk 50 feet activity   Assist Walk 50 feet with 2 turns activity did not occur: Safety/medical concerns         Walk 150 feet activity   Assist Walk 150 feet activity did not occur: Safety/medical concerns         Walk 10 feet on uneven surface  activity   Assist Walk 10 feet on uneven surfaces activity did not occur: Safety/medical concerns         Wheelchair     Assist Is the patient using a wheelchair?: Yes Type of Wheelchair: Manual    Wheelchair assist level: Minimal Assistance - Patient > 75% Max wheelchair distance: 54ft    Wheelchair 50 feet with 2 turns activity    Assist        Assist Level: Minimal Assistance - Patient > 75%   Wheelchair 150 feet activity     Assist      Assist Level: Maximal Assistance - Patient 25 - 49%   Blood pressure 131/61, pulse 74, temperature 97.8 F (36.6 C), temperature source Oral, resp. rate 16, height 5' 4 (1.626 m), weight 108.7 kg, SpO2 96%.  Medical Problem List and Plan: 1. 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  Antithrombotics: -DVT/anticoagulation:  Pharmaceutical: Eliquis - while in hospital- pt doesn't want vs cannot afford to get Medicare D to cover it             -antiplatelet therapy: N/A 3. Pain Management: LCSW to follow for evaluation and support.  4. Mood/Behavior/Sleep: N/A             --Melatonin prn for insomnia.              -antipsychotic agents: N/A  5. Neuropsych/cognition: This patient is capable of making decisions on her own behalf. 6. Skin/Wound Care: Routine  pressure relief measures.  7. Constipation: large BM 12/5: d/c prn enema.  8. T2DM: Hgb A1c-6.8 and diet controlled? No meds due to lack of insurance? --FBS 120-130. Will monitor BS ac/hs for trend and use SSI for elevated BS 9. Class III obesity: Educated on appropriate diet and exercise to help promote mobility and health.             --change diet  to heart healthy.   - Blood sugars fairly well-controlled on current regimen 10.  A fib: Monitor HR TID  11. Hypothyroid: continue synthroid  for supplement.  12.  Kebsiella UTI: continue Keflex , f/u sensitivities  - 12-6: Sensitivities pending, patient symptomatically improving. 13. Hyperlipidemia: Chol- 216. LDL-151. D/c crestor  as per patient's request  14. Low ionized calcium : continue caltrate.     LOS: 4 days A FACE TO FACE EVALUATION WAS PERFORMED  Joesph JAYSON Likes 11/13/2024, 1:45 PM

## 2024-11-13 NOTE — Plan of Care (Signed)
  Problem: Consults Goal: RH STROKE PATIENT EDUCATION Description: See Patient Education module for education specifics  Outcome: Progressing   Problem: RH SAFETY Goal: RH STG ADHERE TO SAFETY PRECAUTIONS W/ASSISTANCE/DEVICE Description: STG Adhere to Safety Precautions With cues Assistance/Device. Outcome: Progressing   Problem: RH KNOWLEDGE DEFICIT Goal: RH STG INCREASE KNOWLEDGE OF DIABETES Description: Patient and family will be able to manage DM using educational resources for medications and dietary modification independently Outcome: Progressing Goal: RH STG INCREASE KNOWLEDGE OF HYPERTENSION Description: Patient and family will be able to manage HTN using educational resources for medications and dietary modification independently Outcome: Progressing Goal: RH STG INCREASE KNOWLEGDE OF HYPERLIPIDEMIA Description: Patient and family will be able to manage HLD using educational resources for medications and dietary modification independently Outcome: Progressing Goal: RH STG INCREASE KNOWLEDGE OF STROKE PROPHYLAXIS Description: Patient and family will be able to manage secondary risks using educational resources for medications and dietary modification independently Outcome: Progressing   Problem: Education: Goal: Ability to describe self-care measures that may prevent or decrease complications (Diabetes Survival Skills Education) will improve Outcome: Progressing Goal: Individualized Educational Video(s) Outcome: Progressing   Problem: Coping: Goal: Ability to adjust to condition or change in health will improve Outcome: Progressing   Problem: Fluid Volume: Goal: Ability to maintain a balanced intake and output will improve Outcome: Progressing   Problem: Health Behavior/Discharge Planning: Goal: Ability to identify and utilize available resources and services will improve Outcome: Progressing Goal: Ability to manage health-related needs will improve Outcome:  Progressing   Problem: Metabolic: Goal: Ability to maintain appropriate glucose levels will improve Outcome: Progressing   Problem: Nutritional: Goal: Maintenance of adequate nutrition will improve Outcome: Progressing Goal: Progress toward achieving an optimal weight will improve Outcome: Progressing   Problem: Skin Integrity: Goal: Risk for impaired skin integrity will decrease Outcome: Progressing   Problem: Tissue Perfusion: Goal: Adequacy of tissue perfusion will improve Outcome: Progressing

## 2024-11-14 MED ORDER — SULFAMETHOXAZOLE-TRIMETHOPRIM 800-160 MG PO TABS
1.0000 | ORAL_TABLET | Freq: Two times a day (BID) | ORAL | Status: AC
  Administered 2024-11-14 – 2024-11-16 (×6): 1 via ORAL
  Filled 2024-11-14 (×6): qty 1

## 2024-11-14 NOTE — Progress Notes (Signed)
 Pharmacy recommending switch ot Bactrim  as Keflex  has poor klebsiella coverage. Ordered BID x3 days.

## 2024-11-14 NOTE — Plan of Care (Signed)
  Problem: Consults Goal: RH STROKE PATIENT EDUCATION Description: See Patient Education module for education specifics  Outcome: Progressing   Problem: RH SAFETY Goal: RH STG ADHERE TO SAFETY PRECAUTIONS W/ASSISTANCE/DEVICE Description: STG Adhere to Safety Precautions With cues Assistance/Device. Outcome: Progressing   Problem: RH KNOWLEDGE DEFICIT Goal: RH STG INCREASE KNOWLEDGE OF DIABETES Description: Patient and family will be able to manage DM using educational resources for medications and dietary modification independently Outcome: Progressing Goal: RH STG INCREASE KNOWLEDGE OF HYPERTENSION Description: Patient and family will be able to manage HTN using educational resources for medications and dietary modification independently Outcome: Progressing Goal: RH STG INCREASE KNOWLEGDE OF HYPERLIPIDEMIA Description: Patient and family will be able to manage HLD using educational resources for medications and dietary modification independently Outcome: Progressing Goal: RH STG INCREASE KNOWLEDGE OF STROKE PROPHYLAXIS Description: Patient and family will be able to manage secondary risks using educational resources for medications and dietary modification independently Outcome: Progressing   Problem: Education: Goal: Ability to describe self-care measures that may prevent or decrease complications (Diabetes Survival Skills Education) will improve Outcome: Progressing Goal: Individualized Educational Video(s) Outcome: Progressing   Problem: Coping: Goal: Ability to adjust to condition or change in health will improve Outcome: Progressing   Problem: Fluid Volume: Goal: Ability to maintain a balanced intake and output will improve Outcome: Progressing   Problem: Health Behavior/Discharge Planning: Goal: Ability to identify and utilize available resources and services will improve Outcome: Progressing Goal: Ability to manage health-related needs will improve Outcome:  Progressing   Problem: Metabolic: Goal: Ability to maintain appropriate glucose levels will improve Outcome: Progressing   Problem: Nutritional: Goal: Maintenance of adequate nutrition will improve Outcome: Progressing Goal: Progress toward achieving an optimal weight will improve Outcome: Progressing   Problem: Skin Integrity: Goal: Risk for impaired skin integrity will decrease Outcome: Progressing   Problem: Tissue Perfusion: Goal: Adequacy of tissue perfusion will improve Outcome: Progressing

## 2024-11-14 NOTE — Progress Notes (Signed)
 Occupational Therapy Session Note  Patient Details  Name: Abigail Ryan MRN: [id number] Date of Birth: [birth year]  Today's Date: 11/14/2024 OT Individual Time: 9196-9084 OT Individual Time Calculation (min): 72 min    Short Term Goals: Week 1:  OT Short Term Goal 1 (Week 1): Patient will transfer to  shower with min assist OT Short Term Goal 2 (Week 1): Patient will transfer to toilet with min assist OT Short Term Goal 3 (Week 1): Patient will don LB clothing- pants/ underwear with min assist OT Short Term Goal 4 (Week 1): Patient will use left hand as gross assist to RUE during BADL  Skilled Therapeutic Interventions/Progress Updates: Patient received sitting up in w/c motivated to participate with therapy. Patient assisted with ADL's, ambulated into the bathroom CGA to perform toileting CGA. Patient ambulated to the walk in shower with transfer bench CGA. See full functional levels listed below. Patient wanting to stay up in w/c. Left with call bell and necessities in reach. Continue with skilled OT POC.     Therapy Documentation Precautions:  Precautions Precautions: Fall Recall of Precautions/Restrictions: Intact Restrictions Weight Bearing Restrictions Per Provider Order: No General:   Vital Signs: Therapy Vitals Temp: 98.2 F (36.8 C) Temp Source: Oral Pulse Rate: (!) 59 Resp: 18 BP: 138/62 Patient Position (if appropriate): Lying Oxygen Therapy SpO2: 96 % O2 Device: Room Air Pain:L shoulder continues to be sore   ADL: ADL Eating: Not assessed Grooming: SBA with standing at sink/ Set up with sitting Upper Body Bathing: set up Where Assessed-Upper Body Bathing: shower seated Lower Body Bathing: Min assist Where Assessed-Lower Body Bathing: shower seated Upper Body Dressing: Minimal assistance Where Assessed-Upper Body Dressing: Sitting at sink Lower Body Dressing: Moderate assistance Where Assessed-Lower Body Dressing: Sitting at sink, Standing at  sink Toileting: CGA Psychologist, Counselling Transfer: CGA Film/video Editor Method: ambulating with RW    Therapy/Group: Individual Therapy  Abigail Ryan 11/14/2024, 12:16 PM

## 2024-11-14 NOTE — Progress Notes (Signed)
 Physical Therapy Session Note  Patient Details  Name: Abigail Ryan MRN: [id number] Date of Birth: 1946-02-12  Today's Date: 11/14/2024 PT Individual Time: 1100-1159 + 1347-1451 PT Individual Time Calculation (min): 59 min + 64 min  Short Term Goals: Week 1:  PT Short Term Goal 1 (Week 1): Pt will complete 5xSTS with modA consistenly with LRAD and without cueing PT Short Term Goal 2 (Week 1): Pt will be able to standing with LRAD for 1 minute with CGA PT Short Term Goal 3 (Week 1): Pt will ambulated 59ft with LRAD and min-modA  Skilled Therapeutic Interventions/Progress Updates:     Session 1: Pt seated in WC and agreeable to therapy upon PT arrival. Pt reports no pain, request to use the restroom prior to leaving room. Pt ambulated with RW to bathroom with CGA and able to perform pericare independently with supervision for safety.  Pt performs sit>stand with CGA without UE support consistently throughout session, PT provided reminders prn. Pt ambulate from room>dayroom with RW and CGA. Participated in game of catch with PT, working on dynamic standing balance, tossing outside of BOS, bounce passes, OH bounce passes, etc. Pt engaged in standing for ~52min bouts x3 with PT providing CGA/close SBA for safety. In standing, holding 2lb dowel rod, pt engaged in boll tossing with PT, equal parameters as previous exercise ~37min bouts x2. Seated rest breaks were provided as necessary.  Pt stood on 3in slanted surface for with AP/PA/lat perturbations provided for last 2 min with min-modA. Increased difficulty initially getting onto surface and maintaining balance to start.   Pt ambulated with RW and CGA 316ft and 348ft back to room with seated rest break provided in between. Increased cueing needing to maintain forward gaze while ambulating. Upon return, pt was left seated in [facility] with all needs within reach.  Session 2: Pt seat in [facility] upon arrival and requested to use restroom prior to leaving. Pt  reports no pain. Pt ambulated to bathroom with RW and CGA and able to perform pericare without assistance. PT provides CGA for pt to reach down to grab pants from floor to pull up over waist. Pt returned to [facility] and was transferred to main gym.   Pt completed following exercises: Seated marches 1x20e w/ 1.5lb ankle weights LAQ 1x20e w/ 1.5lb ankle weights Seated abd w/ knees extended 1x12 Cone step over 1x10e w/ 1.5lb ankle weights Lateral walks on airex plank 2x down/back Toe touches on cone 2x10e w/ 1.5lb ankle weights 4-6in stairs x2 B hand rails and minA  Pt tolerated all exercises and reported mild LE fatigue following session. PT returned pt to room via [facility] and pt was left seated in [facility] with all needs within reach.  Therapy Documentation Precautions:  Precautions Precautions: Fall Recall of Precautions/Restrictions: Intact Restrictions Weight Bearing Restrictions Per Provider Order: No  Therapy/Group: Individual Therapy  Lonell Stamos SPT 11/14/2024, 11:34 AM

## 2024-11-15 NOTE — Plan of Care (Signed)
  Problem: Consults Goal: RH STROKE PATIENT EDUCATION Description: See Patient Education module for education specifics  Outcome: Progressing   Problem: RH SAFETY Goal: RH STG ADHERE TO SAFETY PRECAUTIONS W/ASSISTANCE/DEVICE Description: STG Adhere to Safety Precautions With cues Assistance/Device. Outcome: Progressing   Problem: RH KNOWLEDGE DEFICIT Goal: RH STG INCREASE KNOWLEDGE OF DIABETES Description: Patient and family will be able to manage DM using educational resources for medications and dietary modification independently Outcome: Progressing Goal: RH STG INCREASE KNOWLEDGE OF HYPERTENSION Description: Patient and family will be able to manage HTN using educational resources for medications and dietary modification independently Outcome: Progressing Goal: RH STG INCREASE KNOWLEGDE OF HYPERLIPIDEMIA Description: Patient and family will be able to manage HLD using educational resources for medications and dietary modification independently Outcome: Progressing Goal: RH STG INCREASE KNOWLEDGE OF STROKE PROPHYLAXIS Description: Patient and family will be able to manage secondary risks using educational resources for medications and dietary modification independently Outcome: Progressing

## 2024-11-15 NOTE — Progress Notes (Signed)
 Physical Therapy Session Note  Patient Details  Name: Abigail Ryan MRN: [id number] Date of Birth: 1946/09/13  Today's Date: 11/15/2024 PT Individual Time: 0930-1014 PT Individual Time Calculation (min): 44 min   Short Term Goals: Week 1:  PT Short Term Goal 1 (Week 1): Pt will complete 5xSTS with modA consistenly with LRAD and without cueing PT Short Term Goal 2 (Week 1): Pt will be able to standing with LRAD for 1 minute with CGA PT Short Term Goal 3 (Week 1): Pt will ambulated 66ft with LRAD and min-modA  Skilled Therapeutic Interventions/Progress Updates:     Pt seated in [facility] and agreeable to therapy upon PT arrival. Reports no pain at start of session.  Pt able to don B shoes and tie them independently with increased encouragement from PT. Pt ambulated ~174ft from room>dayroom with RW and CGA. Completed the following exercises with CGA-minA for safety, seated rest provided throughout session prn: Stepping over 5 mini hurdles x82ft x4 Static standing on airex 2x33min Tidal tank figure eight on airex pad 2x30min  Pt tolerated all exercises with min difficulty, cueing provided to keep fwd gaze. Pt ambulated from dayroom>room with B HHA. Mod difficulty with L foot clearance when LE fatigued and Pt observed dec stp/stride length, cadence and dec weight shift on LLE. Pt was left seated in WC, brakes locked, with all needs within reach.    Therapy Documentation Precautions:  Precautions Precautions: Fall Recall of Precautions/Restrictions: Intact Restrictions Weight Bearing Restrictions Per Provider Order: No  Therapy/Group: Individual Therapy  Jailynn Lavalais SPT 11/15/2024, 12:28 PM

## 2024-11-15 NOTE — Progress Notes (Signed)
 Physical Therapy Session Note  Patient Details  Name: Abigail Ryan MRN: [id number] Date of Birth: [birth year]  Today's Date: 11/15/2024 PT Individual Time: 1033-1116 PT Individual Time Calculation (min): 43 min   Short Term Goals: Week 1:  PT Short Term Goal 1 (Week 1): Pt will complete 5xSTS with modA consistenly with LRAD and without cueing PT Short Term Goal 2 (Week 1): Pt will be able to standing with LRAD for 1 minute with CGA PT Short Term Goal 3 (Week 1): Pt will ambulated 8ft with LRAD and min-modA Week 2:     Skilled Therapeutic Interventions/Progress Updates:  Patient *** on entrance to room. Patient alert and agreeable to PT session.   Patient with no pain complaint at start of session.  Therapeutic Activity: Bed Mobility: Pt performed supine <> sit with ***. VC/ tc required for ***. Transfers: Pt performed sit<>stand and stand pivot transfers throughout session with ***. Provided vc/ tc for***.  Gait Training:  Pt ambulated *** ft using *** with ***. Demonstrated ***. Provided vc/ tc for ***.  Wheelchair Mobility:  Pt propelled wheelchair *** feet with ***. Provided vc/ tc for ***.  Neuromuscular Re-ed: NMR facilitated during session with focus on ***. Pt guided in ***. NMR performed for improvements in motor control and coordination, balance, sequencing, judgement, and self confidence/ efficacy in performing all aspects of mobility at highest level of independence.   Therapeutic Exercise: Pt performed the following exercises with vc/ tc for proper technique. ***  Patient *** at end of session with brakes locked, *** alarm set, and all needs within reach.   Therapy Documentation Precautions:  Precautions Precautions: Fall Recall of Precautions/Restrictions: Intact Restrictions Weight Bearing Restrictions Per Provider Order: No  Pain:     Therapy/Group: Individual Therapy  Mliss DELENA Milliner PT, DPT, CSRS 11/15/2024, 5:38 PM

## 2024-11-15 NOTE — Progress Notes (Signed)
 PROGRESS NOTE   Subjective/Complaints: No complaints this morning Has good family support at home and is eager to get back to her grandchildren Vitals stable      11/15/2024    4:58 AM 11/14/2024    8:28 PM 11/14/2024    3:33 PM  Vitals with BMI  Systolic 114 130 859  Diastolic 62 62 68  Pulse 68 77 78    No results for input(s): GLUCAP in the last 72 hours.    P.o. intakes appropriate   Continent of bladder   Last BM 12/7  ROS: LBM 12/7, +urinary urgency- improved, denies pain   Objective:   No results found. No results for input(s): WBC, HGB, HCT, PLT in the last 72 hours.  No results for input(s): NA, K, CL, CO2, GLUCOSE, BUN, CREATININE, CALCIUM  in the last 72 hours.   Intake/Output Summary (Last 24 hours) at 11/15/2024 1332 Last data filed at 11/15/2024 0739 Gross per 24 hour  Intake 460 ml  Output 200 ml  Net 260 ml        Physical Exam: Vital Signs Blood pressure 114/62, pulse 68, temperature 97.8 F (36.6 C), resp. rate 19, height 5' 4 (1.626 m), weight 108.7 kg, SpO2 94%. Gen: no distress, normal appearing, sitting in chair, in bright and positive mood HENT:     Head: Normocephalic and atraumatic.     Comments: Mild L facial droop Tongue midline Facial sensation questionably less on L- she reports it's intermittent    Right Ear: External ear normal.     Left Ear: External ear normal.     Nose: Nose normal. No congestion.     Mouth/Throat:     Mouth: Mucous membranes are dry.     Pharynx: No oropharyngeal exudate.  Eyes:     General:        Right eye: No discharge.        Left eye: No discharge.     Extraocular Movements: Extraocular movements intact.     Comments: No nystagmus  Skin:    General: Skin is warm and dry.     Findings: Bruising present.     Comments: A lot of large purple bruising in Ue's- B/L from IV sticks and blood draws Midline R upper arm   Neurological:     Mental Status: She is alert.     Comments: Ox3 Questionable mild sensory deficit of L side of face No hoffman's Intact to light touch in x4 extremities No increased tone or clonus Intact naming and repetition, stable 12/8   Psychiatric:        Mood and Affect: Mood normal.        Behavior: Behavior normal.    Physical exam unchanged from the above on reexamination 11/15/24    Assessment/Plan: 1. Functional deficits which require 3+ hours per day of interdisciplinary therapy in a comprehensive inpatient rehab setting. Physiatrist is providing close team supervision and 24 hour management of active medical problems listed below. Physiatrist and rehab team continue to assess barriers to discharge/monitor patient progress toward functional and medical goals  Care Tool:  Bathing    Body parts bathed by patient: Left arm, Chest, Abdomen,  Front perineal area, Right upper leg, Left upper leg, Face   Body parts bathed by helper: Buttocks, Right arm, Right lower leg, Left lower leg     Bathing assist Assist Level: Moderate Assistance - Patient 50 - 74%     Upper Body Dressing/Undressing Upper body dressing   What is the patient wearing?: Pull over shirt    Upper body assist Assist Level: Contact Guard/Touching assist    Lower Body Dressing/Undressing Lower body dressing      What is the patient wearing?: Underwear/pull up, Pants     Lower body assist Assist for lower body dressing: Moderate Assistance - Patient 50 - 74%     Toileting Toileting Toileting Activity did not occur (Clothing management and hygiene only): N/A (no void or bm)  Toileting assist Assist for toileting: Moderate Assistance - Patient 50 - 74%     Transfers Chair/bed transfer  Transfers assist     Chair/bed transfer assist level: Minimal Assistance - Patient > 75%     Locomotion Ambulation   Ambulation assist      Assist level: Maximal Assistance - Patient 25 -  49% Assistive device: Walker-rolling Max distance: 51ft   Walk 10 feet activity   Assist     Assist level: Maximal Assistance - Patient 25 - 49% Assistive device: Walker-rolling   Walk 50 feet activity   Assist Walk 50 feet with 2 turns activity did not occur: Safety/medical concerns         Walk 150 feet activity   Assist Walk 150 feet activity did not occur: Safety/medical concerns         Walk 10 feet on uneven surface  activity   Assist Walk 10 feet on uneven surfaces activity did not occur: Safety/medical concerns         Wheelchair     Assist Is the patient using a wheelchair?: Yes Type of Wheelchair: Manual    Wheelchair assist level: Minimal Assistance - Patient > 75% Max wheelchair distance: 61ft    Wheelchair 50 feet with 2 turns activity    Assist        Assist Level: Minimal Assistance - Patient > 75%   Wheelchair 150 feet activity     Assist      Assist Level: Maximal Assistance - Patient 25 - 49%   Blood pressure 114/62, pulse 68, temperature 97.8 F (36.6 C), resp. rate 19, height 5' 4 (1.626 m), weight 108.7 kg, SpO2 94%.  Medical Problem List and Plan: 1. 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  Antithrombotics: -DVT/anticoagulation:  Pharmaceutical: Eliquis - while in hospital- pt doesn't want vs cannot afford to get Medicare D to cover it             -antiplatelet therapy: N/A 3. Pain Management: LCSW to follow for evaluation and support.  4. Mood/Behavior/Sleep: N/A             --Melatonin prn for insomnia.              -antipsychotic agents: N/A 5. Neuropsych/cognition: This patient is capable of making decisions on her own behalf. 6. Skin/Wound Care: Routine pressure relief measures.  7. Constipation: large BM 12/7:  d/c prn enema.  8. T2DM: Hgb A1c-6.8 and diet controlled? No meds due to lack of insurance? --FBS 120-130. Will monitor BS ac/hs for trend and use SSI for elevated BS 9. Class III obesity: Educated on appropriate diet and exercise to help promote mobility and health.             --change diet  to heart healthy.   - Blood sugars fairly well-controlled on current regimen  10.  A fib: Monitor HR TID, continue Eliquis   11. Hypothyroid: continue synthroid  for supplement.  12.  Kebsiella UTI: Keflex  changed to Bactrim  as per pharmacy recs  - 12-6: Sensitivities pending, patient symptomatically improving.  13. Hyperlipidemia: Chol- 216. LDL-151. D/c crestor  as per patient's request  14. Low ionized calcium : continue caltrate.     LOS: 6 days A FACE TO FACE EVALUATION WAS PERFORMED  Paulo Keimig P Geralyn Figiel 11/15/2024, 1:32 PM

## 2024-11-15 NOTE — Progress Notes (Addendum)
 Occupational Therapy Session Note  Patient Details  Name: Abigail Ryan MRN: [id number] Date of Birth: [birth year]  Today's Date: 11/15/2024 OT Individual Time: 8693-8592 OT Individual Time Calculation (min): 61 min    Short Term Goals: Week 1:  OT Short Term Goal 1 (Week 1): Patient will transfer to  shower with min assist OT Short Term Goal 2 (Week 1): Patient will transfer to toilet with min assist OT Short Term Goal 3 (Week 1): Patient will don LB clothing- pants/ underwear with min assist OT Short Term Goal 4 (Week 1): Patient will use left hand as gross assist to RUE during BADL  Skilled Therapeutic Interventions/Progress Updates:  Pt greeted seated in w/c, pt agreeable to OT intervention.      Transfers/bed mobility/functional mobility:  Pt completed functional ambulation from gym>room with Rw and supervision.    ADLs:  Transfers: pt completed ambulatory toilet transfer from w/c>bathroom with Rw with supervision  Pt additionally completed ambulatory transfer to TTB, discussed various DME for showering with pt preferring TTB. Pt completed transfer with CGA for safety with RW. Pt plans to purchase her own TTB..  Toileting: pt completed 3/3 toileting with supervision, continent urine void.    NMR:  Pt completed various activities focused on AROM of LUE with pt instructed to reach at chest level to grasp various items such as horseshoes/squigz to facilitate improved functional grasp for higher level ADL tasks. Pt completed all tasks with supervision.   Pt completed seated FMC tasks with pt instructed to use clothespins to grasp geometric shapes to facilitate improved grip strength for ADL participation. Pt completed task with increased time but overall supervision.   Pt instructed to use LUE to duplicate geometric structure from visual aid provided with a focus on forced use of LUE, pt completed task with supervision.   Pt completed seated bimanual task with pt instructed to  complete shoulder flexion with 2lb dowel to 90* while balancing bean bag on bar to improve bilateral coordination and motor planning. Pt completed task with increased time but supervision.                 Ended session with pt supine in bed with all needs within reach and bed alarm activated.                    Therapy Documentation Precautions:  Precautions Precautions: Fall Recall of Precautions/Restrictions: Intact Restrictions Weight Bearing Restrictions Per Provider Order: No  Pain: No pain    Therapy/Group: Individual Therapy  Ronal Mallie Needy 11/15/2024, 3:32 PM

## 2024-11-15 NOTE — Progress Notes (Signed)
 Occupational Therapy Session Note  Patient Details  Name: Abigail Ryan MRN: [id number] Date of Birth: 1946-01-02  Today's Date: 11/15/2024 OT Individual Time: 9199-9141 OT Individual Time Calculation (min): 58 min    Short Term Goals: Week 1:  OT Short Term Goal 1 (Week 1): Patient will transfer to  shower with min assist OT Short Term Goal 2 (Week 1): Patient will transfer to toilet with min assist OT Short Term Goal 3 (Week 1): Patient will don LB clothing- pants/ underwear with min assist OT Short Term Goal 4 (Week 1): Patient will use left hand as gross assist to RUE during BADL  Skilled Therapeutic Interventions/Progress Updates: Patient received sitting up in w/c. Agreeable to OT treatment. Patient performed grooming tasks independently from w/c prior to OTs arrival. Assisted to walk into the bathroom for a shower. Patient set up for seated shower. Will continue to use long handled sponge for bathing as she did prior to admit. See ADL grid for current functional levels. Patient continues to make good progress with use of LUE, no longer needing assist with RUE bathing. Continue with skilled OT POC to achieve all LTG's.     Therapy Documentation Precautions:  Precautions Precautions: Fall Recall of Precautions/Restrictions: Intact Restrictions Weight Bearing Restrictions Per Provider Order: No General:   Vital Signs: Therapy Vitals Temp: 97.8 F (36.6 C) Pulse Rate: 68 Resp: 19 BP: 114/62 Patient Position (if appropriate): Lying Oxygen Therapy SpO2: 94 % O2 Device: Room Air Pain: Pain Assessment Pain Scale: 0-10 Pain Score: 0-No pain ADL: ADL Eating: Independent Grooming: Modified Independent Where Assessed-Grooming: Sitting at sink Upper Body Bathing: distant supervision Where Assessed-Upper Body Bathing: Sitting in shower Lower Body Bathing: distant supervision Where Assessed-Lower Body Bathing:shower Upper Body Dressing: modified independent Where  Assessed-Upper Body Dressing: EOB Lower Body Dressing: distant supervision Where Assessed-Lower Body Dressing: EOB Toileting: distant supervision Tub/Shower Transfer: Unable to assess Film/video Editor: supervision Film/video Editor Method: ambulating    Therapy/Group: Individual Therapy  Abigail Ryan 11/15/2024, 12:03 PM

## 2024-11-16 NOTE — Plan of Care (Signed)
  Problem: Consults Goal: RH STROKE PATIENT EDUCATION Description: See Patient Education module for education specifics  Outcome: Progressing   Problem: RH SAFETY Goal: RH STG ADHERE TO SAFETY PRECAUTIONS W/ASSISTANCE/DEVICE Description: STG Adhere to Safety Precautions With cues Assistance/Device. Outcome: Progressing   Problem: RH KNOWLEDGE DEFICIT Goal: RH STG INCREASE KNOWLEDGE OF DIABETES Description: Patient and family will be able to manage DM using educational resources for medications and dietary modification independently Outcome: Progressing Goal: RH STG INCREASE KNOWLEDGE OF HYPERTENSION Description: Patient and family will be able to manage HTN using educational resources for medications and dietary modification independently Outcome: Progressing Goal: RH STG INCREASE KNOWLEGDE OF HYPERLIPIDEMIA Description: Patient and family will be able to manage HLD using educational resources for medications and dietary modification independently Outcome: Progressing Goal: RH STG INCREASE KNOWLEDGE OF STROKE PROPHYLAXIS Description: Patient and family will be able to manage secondary risks using educational resources for medications and dietary modification independently Outcome: Progressing

## 2024-11-16 NOTE — Progress Notes (Signed)
 Physical Therapy Session Note  Patient Details  Name: Abigail Ryan MRN: [id number] Date of Birth: 02/01/1946  Today's Date: 11/16/2024 PT Individual Time: 0803-0902 PT Individual Time Calculation (min): 59 min   Short Term Goals: Week 1:  PT Short Term Goal 1 (Week 1): Pt will complete 5xSTS with modA consistenly with LRAD and without cueing PT Short Term Goal 2 (Week 1): Pt will be able to standing with LRAD for 1 minute with CGA PT Short Term Goal 3 (Week 1): Pt will ambulated 22ft with LRAD and min-modA  Skilled Therapeutic Interventions/Progress Updates:     Pt seated in WC at sink performing self care upon PT arrival. No pain reported. Pt don B shoes independently seated in WC using figure 4 positioning and reacher prn. Pt required increased encouragement to complete task with mod difficulty d/t pt body habitus and min difficulty in L hand fine motor control that has significantly improved from eval date.  Session focused on pt ST/LTGs to assess possibility of early d/c that will be discussed in team conference on 12/10. Pt ambulated 350ft from room>MW nsg station>main gym with RW and touch assist for safety. Mild L knee locking and decr stance time on LLE noted. Pt provided cueing to not fully lock L knee during gait and maintain L quad engagement. Seated rest break provided. Pet complete 5xSTS from mat level without UE support, min assistance from mat on back of knees when coming into standing.   Pt demonstrate ability to asc/desc 4-6in stair with R hand rail only and CGA for safety. Seated rest break provided d/t LE fatigue. Pt ambulated with RW and close SBA to apartment to perform furniture transfer with BUE assistance and supervision successfully.   Finished session ambulating from apartment>pt room with RW and close SBA. PT provided consistent cueing to maintain fwd gaze and decr Ue weight through RW during gait. Pt was left seated in WC, brakes locked, with all need within  reach.  Therapy Documentation Precautions:  Precautions Precautions: Fall Recall of Precautions/Restrictions: Intact Restrictions Weight Bearing Restrictions Per Provider Order: No  Therapy/Group: Individual Therapy  Kirbi Farrugia SPT 11/16/2024, 9:07 AM

## 2024-11-16 NOTE — Progress Notes (Signed)
 PROGRESS NOTE   Subjective/Complaints: No new complaints this morning Working on stairs  Hopeful to leave early Had symptoms of a cold last night and would like to start her home Lung Support supplement     11/16/2024    4:16 AM 11/15/2024    8:24 PM 11/15/2024    2:52 PM  Vitals with BMI  Systolic 127 133 866  Diastolic 59 64 56  Pulse 69 78 69    No results for input(s): GLUCAP in the last 72 hours.     ROS: LBM 12/9, +urinary urgency- improved, denies pain, +cold symptoms   Objective:   No results found. No results for input(s): WBC, HGB, HCT, PLT in the last 72 hours.  No results for input(s): NA, K, CL, CO2, GLUCOSE, BUN, CREATININE, CALCIUM  in the last 72 hours.   Intake/Output Summary (Last 24 hours) at 11/16/2024 1108 Last data filed at 11/16/2024 0700 Gross per 24 hour  Intake 680 ml  Output --  Net 680 ml        Physical Exam: Vital Signs Blood pressure (!) 127/59, pulse 69, temperature 98.4 F (36.9 C), temperature source Oral, resp. rate 19, height 5' 4 (1.626 m), weight 108.7 kg, SpO2 98%. Gen: no distress, normal appearing, sitting in chair, in bright and positive mood HENT:     Head: Normocephalic and atraumatic.     Comments: Mild L facial droop Tongue midline Facial sensation questionably less on L- she reports it's intermittent    Right Ear: External ear normal.     Left Ear: External ear normal.     Nose: Nose normal. No congestion.     Mouth/Throat:     Mouth: Mucous membranes are dry.     Pharynx: No oropharyngeal exudate.  Eyes:     General:        Right eye: No discharge.        Left eye: No discharge.     Extraocular Movements: Extraocular movements intact.     Comments: No nystagmus  Skin:    General: Skin is warm and dry.     Findings: Bruising present.     Comments: A lot of large purple bruising in Ue's- B/L from IV sticks and blood  draws Midline R upper arm  Neurological:     Mental Status: She is alert.     Comments: Ox3 Questionable mild sensory deficit of L side of face No hoffman's Intact to light touch in x4 extremities No increased tone or clonus Intact naming and repetition Working on stair training with therapy   Psychiatric:        Mood and Affect: Mood normal.        Behavior: Behavior normal.    Physical exam unchanged from the above on reexamination 11/16/24    Assessment/Plan: 1. Functional deficits which require 3+ hours per day of interdisciplinary therapy in a comprehensive inpatient rehab setting. Physiatrist is providing close team supervision and 24 hour management of active medical problems listed below. Physiatrist and rehab team continue to assess barriers to discharge/monitor patient progress toward functional and medical goals  Care Tool:  Bathing    Body parts bathed by patient:  Left arm, Chest, Abdomen, Front perineal area, Right upper leg, Left upper leg, Face   Body parts bathed by helper: Buttocks, Right arm, Right lower leg, Left lower leg     Bathing assist Assist Level: Moderate Assistance - Patient 50 - 74%     Upper Body Dressing/Undressing Upper body dressing   What is the patient wearing?: Pull over shirt    Upper body assist Assist Level: Contact Guard/Touching assist    Lower Body Dressing/Undressing Lower body dressing      What is the patient wearing?: Underwear/pull up, Pants     Lower body assist Assist for lower body dressing: Moderate Assistance - Patient 50 - 74%     Toileting Toileting Toileting Activity did not occur (Clothing management and hygiene only): N/A (no void or bm)  Toileting assist Assist for toileting: Moderate Assistance - Patient 50 - 74%     Transfers Chair/bed transfer  Transfers assist     Chair/bed transfer assist level: Minimal Assistance - Patient > 75%     Locomotion Ambulation   Ambulation assist       Assist level: Maximal Assistance - Patient 25 - 49% Assistive device: Walker-rolling Max distance: 54ft   Walk 10 feet activity   Assist     Assist level: Maximal Assistance - Patient 25 - 49% Assistive device: Walker-rolling   Walk 50 feet activity   Assist Walk 50 feet with 2 turns activity did not occur: Safety/medical concerns         Walk 150 feet activity   Assist Walk 150 feet activity did not occur: Safety/medical concerns         Walk 10 feet on uneven surface  activity   Assist Walk 10 feet on uneven surfaces activity did not occur: Safety/medical concerns         Wheelchair     Assist Is the patient using a wheelchair?: Yes Type of Wheelchair: Manual    Wheelchair assist level: Minimal Assistance - Patient > 75% Max wheelchair distance: 79ft    Wheelchair 50 feet with 2 turns activity    Assist        Assist Level: Minimal Assistance - Patient > 75%   Wheelchair 150 feet activity     Assist      Assist Level: Maximal Assistance - Patient 25 - 49%   Blood pressure (!) 127/59, pulse 69, temperature 98.4 F (36.9 C), temperature source Oral, resp. rate 19, height 5' 4 (1.626 m), weight 108.7 kg, SpO2 98%.  Medical Problem List and Plan: 1. 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  Antithrombotics: -DVT/anticoagulation:  Pharmaceutical: Eliquis - while in hospital- pt doesn't want vs cannot afford to get Medicare D to cover it             -antiplatelet therapy: N/A 3. Pain Management: LCSW to follow for evaluation and support.  4. Mood/Behavior/Sleep: N/A             --Melatonin prn for insomnia.              -antipsychotic agents: N/A 5. Neuropsych/cognition: This patient is capable of making decisions on her own behalf. 6.  Skin/Wound Care: Routine pressure relief measures.  7. Constipation: large BM 12/9: d/c prn enema, d/c suppository  8. T2DM: Hgb A1c-6.8: d/c CBG checks since well controlled  9. Class III obesity: Educated on appropriate diet and exercise to help promote mobility and health.             --change diet  to heart healthy.  10.  A fib: Monitor HR TID, continue Eliquis   11. Hypothyroid: continue synthroid  for supplement.  12.  Kebsiella UTI: Keflex  changed to Bactrim  as per pharmacy recs  13. Hyperlipidemia: Chol- 216. LDL-151. D/c crestor  as per patient's request  14. Low ionized calcium : continue caltrate.     LOS: 7 days A FACE TO FACE EVALUATION WAS PERFORMED  Lincy Belles P Murrel Bertram 11/16/2024, 11:08 AM

## 2024-11-16 NOTE — Progress Notes (Signed)
 Physical Therapy Session Note  Patient Details  Name: Abigail Ryan MRN: [id number] Date of Birth: [birth year]  Today's Date: 11/16/2024 PT Individual Time: 1516-1625 PT Individual Time Calculation (min): 69 min   Short Term Goals: Week 1:  PT Short Term Goal 1 (Week 1): Pt will complete 5xSTS with modA consistenly with LRAD and without cueing PT Short Term Goal 2 (Week 1): Pt will be able to standing with LRAD for 1 minute with CGA PT Short Term Goal 3 (Week 1): Pt will ambulated 7ft with LRAD and min-modA  Skilled Therapeutic Interventions/Progress Updates:     Pt seated in WC and agreeable to therapy upon PT arrival. Pt reports no pain at start of session. Very eager to d/c prior to original estimated d/c date of 12/18.  PT transferred pt to main gym via [facility] for time. Pt donned shoes mod I seated with shoe horn for assistance, ++ time necessary. Pt ambulated ~22ft with RUE HHA to stairs. Asc/desc 4-6in stairs with CGA using R hand rail only and step-to pattern to simulate STE home. Seated rest break provided per pt request d/t fatigue. Pt able to asc/desc stairs again x2 with equal assistance as above. PT provider cueing as reminder to ensure LLE is stable when desc prior to bring RLE down to same step d/t mod L knee instability. No LOB or missteps noted throughout.  PT and pt discussed d/c planning for 12/11 and importance of scheduling family ed session prior to d/c. Pt education provided regarding what fmaily ed session would consist of and the requirements to d/c a patient home safely.   Pt ambulated ~212ft from main gym>room with RW and CGA/close SBA for safety. Requested to use bathroom prior to laying down. Pt demonstrated ability to ambulate to bathroom with RW and perform pericare with mod I. Performed bed mobility sit>supine with supervision.  Pt requested she call family members while PT in the room to discuss/set-up family ed for d/c planning to ensure that d/c goes smoothly. PT  spoke with pt daughter and granddaughter, to which they agreed to participate in family ed session on 12/10 for possible early d/c on 12/11. Pt was left supin in bed with all needs met and bed alarm set.  Therapy Documentation Precautions:  Precautions Precautions: Fall Recall of Precautions/Restrictions: Intact Restrictions Weight Bearing Restrictions Per Provider Order: No   Therapy/Group: Individual Therapy  Mckaela Howley SPT 11/16/2024, 4:39 PM

## 2024-11-16 NOTE — Progress Notes (Signed)
 Occupational Therapy Session Note  Patient Details  Name: Abigail Ryan MRN: [id number] Date of Birth: 01/15/1946  Today's Date: 11/16/2024 OT Individual Time: 8652-8542 OT Individual Time Calculation (min): 70 min    Short Term Goals: Week 1:  OT Short Term Goal 1 (Week 1): Patient will transfer to  shower with min assist OT Short Term Goal 2 (Week 1): Patient will transfer to toilet with min assist OT Short Term Goal 3 (Week 1): Patient will don LB clothing- pants/ underwear with min assist OT Short Term Goal 4 (Week 1): Patient will use left hand as gross assist to RUE during BADL  Skilled Therapeutic Interventions/Progress Updates:     Pt received sitting up in wc presenting to be in good spirits receptive to skilled OT session reporting 0/10 pain- OT offering intermittent rest breaks, repositioning, and therapeutic support to optimize participation in therapy session. Pt requesting to take shower. She completed ambulatory transfers this session using RW with SUP provided for safety, however no LOB noted and Pt demo'ing appropriate safety awareness. She doffed clothing while seated with SUP. Majority of U/LB bathing completed in seated position MOD I, standing x2 trials with use of grab bars to wash and rinse buttocks. Dried self seated following shower with Min  to dry feet and lower B LEs. Following shower, U/LB dressing completed seated in w/c donning OH shirt MOD I. SUP provided when donning pants with MIN questioning cues required to recall and implement hemi-dressing technique. Pt reporting some fatigue following shower so opted to complete grooming/hygiene tasks in seated position at sink for energy conservation. She dried her hair, applied lotion, applied make-up, and completed oral care MOD I. Remainder of session focused on educating Pt on CVA etiology/recovery process and discussing modifiable risk factors for CVA with Pt receptive to education and engaged in conversation. Pt was left  resting in wc with call bell in reach, seatbelt alarm on, and all needs met.    Therapy Documentation Precautions:  Precautions Precautions: Fall Recall of Precautions/Restrictions: Intact Restrictions Weight Bearing Restrictions Per Provider Order: No   Therapy/Group: Individual Therapy  Katheryn SHAUNNA Mines 11/16/2024, 7:31 AM

## 2024-11-16 NOTE — Progress Notes (Signed)
 Physical Therapy Session Note  Patient Details  Name: Abigail Ryan MRN: [id number] Date of Birth: 03/31/1946  Today's Date: 11/16/2024 PT Individual Time: 1015-1115 PT Individual Time Calculation (min): 60 min   Short Term Goals: Week 1:  PT Short Term Goal 1 (Week 1): Pt will complete 5xSTS with modA consistenly with LRAD and without cueing PT Short Term Goal 2 (Week 1): Pt will be able to standing with LRAD for 1 minute with CGA PT Short Term Goal 3 (Week 1): Pt will ambulated 7ft with LRAD and min-modA  Skilled Therapeutic Interventions/Progress Updates: Pt presents sitting in w/c and agreeable to therapy.  Pt states soreness to B shoulders and rest breaks given during session for pain management.  Pt transfers sit to stand w/ supervision from w/c.  Pt amb to dayroom w/ supervision and cues for L foot clearance.  Initially pt w/ decreased step length right and cued for improved cadence.  Pt performed Nu-step at Level 4 x 10'  for total of 609 steps, averaging 61 spm and 1.8 METS.  Pt performed 3 blocks of 5 STS on Airex cushion, and min/CGA and cues for forward lean and improved to CGA, occasional close sup  Pt performed 2 x 5 STS w/ R foot on 2 platform w/ CGA and cues for maintaining soft knee position on L in stance.  Pt required seated rest breaks 2/2 fatigue.  Pt amb x 175' w/o AD and min/CGA, cues for posture, arm swing and L foot clearance.  Pt increased L foot scuff w/ fatigue.  Pt amb to room and transferred sit to supine w/ supervision.  Bed alarm on and all needs in reach.  Pt requested all 4 rails up.       Therapy Documentation Precautions:  Precautions Precautions: Fall Recall of Precautions/Restrictions: Intact Restrictions Weight Bearing Restrictions Per Provider Order: No General:   Vital Signs:   Pain:4/10 Pain Assessment Pain Scale: 0-10 Pain Score: 0-No pain    Therapy/Group: Individual Therapy  Lemoyne Nestor P Shantaya Bluestone 11/16/2024, 11:17 AM

## 2024-11-17 MED ORDER — CALCIUM CITRATE 950 (200 CA) MG PO TABS
200.0000 mg | ORAL_TABLET | Freq: Every day | ORAL | 0 refills | Status: AC
  Filled 2024-11-17 – 2024-11-18 (×2): qty 30, 30d supply, fill #0

## 2024-11-17 MED ORDER — PANTOPRAZOLE SODIUM 40 MG PO TBEC
40.0000 mg | DELAYED_RELEASE_TABLET | Freq: Every day | ORAL | 0 refills | Status: DC
  Filled 2024-11-17: qty 30, 30d supply, fill #0

## 2024-11-17 MED ORDER — MELATONIN 3 MG PO TABS
3.0000 mg | ORAL_TABLET | Freq: Every day | ORAL | 0 refills | Status: AC
  Filled 2024-11-17: qty 30, 30d supply, fill #0

## 2024-11-17 MED ORDER — APIXABAN 5 MG PO TABS
5.0000 mg | ORAL_TABLET | Freq: Two times a day (BID) | ORAL | 0 refills | Status: DC
  Filled 2024-11-17: qty 60, 30d supply, fill #0

## 2024-11-17 NOTE — Progress Notes (Signed)
 Patient is alert and oriented x 4. Patient resting in bed not showing any signs of discomfort or distress. Patient states  my doctor told me I can take my own vitamin C and lung supplement. Patient educated on hospital policy regarding taking medication from home and that her lung support supplement has been ordered by her doctor. Patient states she does not need it tonight but wanted nursing to know that the doctor okayed her to take it. Will pass off in report that the patient is requesting Vitamin C to be ordered.

## 2024-11-17 NOTE — Progress Notes (Signed)
 Occupational Therapy Discharge Summary  Patient Details  Name: Abigail Ryan MRN: [id number] Date of Birth: 04/10/1946  Date of Discharge from OT service:November 17, 2024  Today's Date: 11/17/2024     Patient has met 11 of 11 long term goals due to improved activity tolerance, improved balance, ability to compensate for deficits, functional use of  RIGHT upper, LEFT upper, and LEFT lower extremity, and improved coordination.  Patient to discharge at overall Modified Independent level.  Patient's care partner is independent to provide the necessary physical assistance at discharge.    Reasons goals not met: NA  Recommendation:  Patient will benefit from ongoing skilled OT services in outpatient setting to continue to advance functional skills in the area of iADL and Reduce care partner burden.  Equipment: Drop arm commode  Reasons for discharge: treatment goals met and discharge from hospital  Patient/family agrees with progress made and goals achieved: Yes  OT Discharge Precautions/Restrictions  Precautions Precautions: Fall Recall of Precautions/Restrictions: Intact Restrictions Weight Bearing Restrictions Per Provider Order: No   Pain Pain Assessment Pain Scale: 0-10 Pain Score: 0-No pain ADL ADL Eating: Independent Where Assessed-Eating: Chair Grooming: Modified independent Where Assessed-Grooming: Standing at sink, Sitting at sink Upper Body Bathing: Modified independent Where Assessed-Upper Body Bathing: Shower Lower Body Bathing: Modified independent Where Assessed-Lower Body Bathing: Shower Upper Body Dressing: Modified independent (Device) Where Assessed-Upper Body Dressing: Chair Lower Body Dressing: Modified independent Where Assessed-Lower Body Dressing: Chair, Standing at sink Toileting: Modified independent Where Assessed-Toileting: Neurosurgeon Method: Proofreader: Drop arm  bedside commode Tub/Shower Transfer: Modified independent Web Designer Method: Ship Broker: Insurance Underwriter: Modified independent Film/video Editor Method: Designer, Industrial/product: Sales Promotion Account Executive Baseline Vision/History: 0 No visual deficits Patient Visual Report: No change from baseline Vision Assessment?: Wears glasses for reading Perception  Perception: Within Functional Limits Praxis Praxis: WFL Cognition Cognition Overall Cognitive Status: Within Functional Limits for tasks assessed Arousal/Alertness: Awake/alert Orientation Level: Person;Place;Situation Person: Oriented Place: Oriented Situation: Oriented Memory: Appears intact Attention: Selective;Alternating Selective Attention: Appears intact Awareness: Appears intact Problem Solving: Appears intact Executive Function: Organizing Reasoning: Appears intact Organizing: Appears intact Safety/Judgment: Appears intact Brief Interview for Mental Status (BIMS) Repetition of Three Words (First Attempt): 3 Temporal Orientation: Year: Correct Temporal Orientation: Month: Accurate within 5 days Temporal Orientation: Day: Correct Recall: Sock: Yes, no cue required Recall: Blue: Yes, no cue required Recall: Bed: Yes, no cue required BIMS Summary Score: 15 Sensation Sensation Light Touch: Appears Intact Hot/Cold: Appears Intact Proprioception: Appears Intact Stereognosis: Appears Intact Coordination Gross Motor Movements are Fluid and Coordinated: Yes Fine Motor Movements are Fluid and Coordinated: No Motor  Motor Motor - Discharge Observations: non dominant left hemiparesis Mobility  Bed Mobility Bed Mobility: Supine to Sit;Sit to Supine Rolling Right: Independent with assistive device Supine to Sit: Independent with assistive device Sit to Supine: Independent with assistive device Transfers Sit to Stand: Independent with  assistive device Stand to Sit: Independent with assistive device  Trunk/Postural Assessment  Cervical Assessment Cervical Assessment: Within Functional Limits Thoracic Assessment Thoracic Assessment: Within Functional Limits Lumbar Assessment Lumbar Assessment: Exceptions to [facility] Postural Control Postural Control: Within Functional Limits  Balance Balance Balance Assessed: Yes Static Sitting Balance Static Sitting - Balance Support: No upper extremity supported;Feet supported Static Sitting - Level of Assistance: 7: Independent Dynamic Sitting Balance Dynamic Sitting - Balance Support: Right upper extremity supported;Left upper extremity supported;Feet supported;During functional activity Dynamic  Sitting - Level of Assistance: 6: Modified independent (Device/Increase time) Static Standing Balance Static Standing - Balance Support: During functional activity;Left upper extremity supported;Right upper extremity supported Static Standing - Level of Assistance: 6: Modified independent (Device/Increase time) Dynamic Standing Balance Dynamic Standing - Balance Support: During functional activity;Bilateral upper extremity supported Dynamic Standing - Level of Assistance: 6: Modified independent (Device/Increase time) Extremity/Trunk Assessment RUE Assessment RUE Assessment: Within Functional Limits Active Range of Motion (AROM) Comments: Has ~ 120* shoulder flexion LUE Assessment LUE Assessment: Exceptions to [facility] Active Range of Motion (AROM) Comments: Has ~120* shoulder flexion LUE Body System: Neuro Brunstrum levels for arm and hand: Arm;Hand Brunstrum level for arm: Stage V Relative Independence from Synergy Brunstrum level for hand: Stage VI Isolated joint movements   [name] 11/17/2024, 10:10 AM

## 2024-11-17 NOTE — Progress Notes (Signed)
 Physical Therapy Session Note  Patient Details  Name: Abigail Ryan MRN: [id number] Date of Birth: 12-01-1946  Today's Date: 11/17/2024 PT Individual Time: 1130-1200 PT Individual Time Calculation (min): 30 min   Short Term Goals: Week 1:  PT Short Term Goal 1 (Week 1): Pt will complete 5xSTS with modA consistenly with LRAD and without cueing PT Short Term Goal 2 (Week 1): Pt will be able to standing with LRAD for 1 minute with CGA PT Short Term Goal 3 (Week 1): Pt will ambulated 67ft with LRAD and min-modA  Skilled Therapeutic Interventions/Progress Updates:     Pt seated in WC upon arrival. Pt denies pain and agreeable to therapy. Session emphasized Pt requested to use bathroom. Pt performed short distance ambulatory transfer WC to [facility] over toilet using RW with supervision. Pt completed clothing management and peri hygiene with supervision. Pt instructed in and performed 1x10 for each of the following B LE strengthening exercises to establish HEP: seated LAQ, seated march, seated heel/toe taises, seated hip adduction pillow squeeze, B UE supported mini squats, B UE supported marching, B UE supported hip abduction, B UE supported hip extension. PT provided pt with illustrated handout for HEP. Pt demonstrated and verbalized understanding. Pt remained seated in [facility] with all needs in reach at end of session.  Therapy Documentation Precautions:  Precautions Precautions: Fall Recall of Precautions/Restrictions: Intact Restrictions Weight Bearing Restrictions Per Provider Order: No  Therapy/Group: Individual Therapy  Comer CHRISTELLA Levora Comer Levora, PT, DPT 11/17/2024, 7:31 AM

## 2024-11-17 NOTE — Progress Notes (Addendum)
 Patient ID: Abigail Ryan, female   DOB: 01/15/1946, 78 y.o.   MRN: [id number]  Have reviewed team conference with pt and family. Both aware and agreeable with targeted d/c date of 12/11 and goals of Supervision/Verbal cueing.   DABSC and RW ordered via [employer]. OP PT/OT recommended.

## 2024-11-17 NOTE — Plan of Care (Signed)
  Problem: Consults Goal: RH STROKE PATIENT EDUCATION Description: See Patient Education module for education specifics  Outcome: Progressing   Problem: RH SAFETY Goal: RH STG ADHERE TO SAFETY PRECAUTIONS W/ASSISTANCE/DEVICE Description: STG Adhere to Safety Precautions With cues Assistance/Device. Outcome: Progressing   Problem: RH KNOWLEDGE DEFICIT Goal: RH STG INCREASE KNOWLEDGE OF DIABETES Description: Patient and family will be able to manage DM using educational resources for medications and dietary modification independently Outcome: Progressing Goal: RH STG INCREASE KNOWLEDGE OF HYPERTENSION Description: Patient and family will be able to manage HTN using educational resources for medications and dietary modification independently Outcome: Progressing Goal: RH STG INCREASE KNOWLEGDE OF HYPERLIPIDEMIA Description: Patient and family will be able to manage HLD using educational resources for medications and dietary modification independently Outcome: Progressing Goal: RH STG INCREASE KNOWLEDGE OF STROKE PROPHYLAXIS Description: Patient and family will be able to manage secondary risks using educational resources for medications and dietary modification independently Outcome: Progressing   Problem: Education: Goal: Ability to describe self-care measures that may prevent or decrease complications (Diabetes Survival Skills Education) will improve Outcome: Progressing Goal: Individualized Educational Video(s) Outcome: Progressing   Problem: Coping: Goal: Ability to adjust to condition or change in health will improve Outcome: Progressing   Problem: Fluid Volume: Goal: Ability to maintain a balanced intake and output will improve Outcome: Progressing   Problem: Health Behavior/Discharge Planning: Goal: Ability to identify and utilize available resources and services will improve Outcome: Progressing Goal: Ability to manage health-related needs will improve Outcome:  Progressing   Problem: Metabolic: Goal: Ability to maintain appropriate glucose levels will improve Outcome: Progressing   Problem: Nutritional: Goal: Maintenance of adequate nutrition will improve Outcome: Progressing Goal: Progress toward achieving an optimal weight will improve Outcome: Progressing   Problem: Skin Integrity: Goal: Risk for impaired skin integrity will decrease Outcome: Progressing   Problem: Tissue Perfusion: Goal: Adequacy of tissue perfusion will improve Outcome: Progressing

## 2024-11-17 NOTE — Progress Notes (Signed)
 Occupational Therapy Session Note  Patient Details  Name: Abigail Ryan MRN: [id number] Date of Birth: 1946-03-03  Today's Date: 11/17/2024 OT Individual Time: 1345-1426 OT Individual Time Calculation (min): 41 min    Short Term Goals: Week 1:  OT Short Term Goal 1 (Week 1): Patient will transfer to  shower with min assist OT Short Term Goal 1 - Progress (Week 1): Met OT Short Term Goal 2 (Week 1): Patient will transfer to toilet with min assist OT Short Term Goal 2 - Progress (Week 1): Met OT Short Term Goal 3 (Week 1): Patient will don LB clothing- pants/ underwear with min assist OT Short Term Goal 3 - Progress (Week 1): Met OT Short Term Goal 4 (Week 1): Patient will use left hand as gross assist to RUE during BADL OT Short Term Goal 4 - Progress (Week 1): Met Week 2:  OT Short Term Goal 1 (Week 2): STG=LTG due to LOS  Skilled Therapeutic Interventions/Progress Updates:   Patient received seated in wheelchair immediately following PT session.  Patient's daughter and grand daughter present.  Reviewed recommendations for shower, and equipment for shower and toileting.  Reviewed use of drop arm commode - for increased space on toilet.   Reviewed level of independence with BADL, and safe mobility around daughter's pets.   Daughter assisted patient to bathroom safely.   Patient and family very excited for discharge.   Patient left up in wheelchair at end of session.  Therapy Documentation Precautions:  Precautions Precautions: Fall Recall of Precautions/Restrictions: Intact Restrictions Weight Bearing Restrictions Per Provider Order: No  Pain:  Denies pain    Therapy/Group: Individual Therapy  Leean Amezcua M 11/17/2024, 2:37 PM

## 2024-11-17 NOTE — Progress Notes (Signed)
 Physical Therapy Note  Patient Details  Name: EITHEL RYALL MRN: [id number] Date of Birth: [birth year] Today's Date: 11/17/2024    Physical Therapist participated in the interdisciplinary team conference, providing clinical information regarding the patient's current status, treatment goals, and weekly focus, including any barriers that need to be addressed. Please see the Inpatient Rehabilitation Team Conference and Plan of Care Update for further details.    Shanasia Ibrahim P Hamdan Toscano 11/17/2024, 7:47 AM

## 2024-11-17 NOTE — Patient Care Conference (Signed)
 Inpatient RehabilitationTeam Conference and Plan of Care Update Date: 11/17/2024   Time: 11:02 AM    Patient Name: Abigail Ryan      Medical Record Number: [id number]  Date of Birth: 10/06/1946 Sex: Female         Room/Bed: 4W08C/4W08C-01 Payor Info: Payor: [employer] MEDICARE / Plan: [facility] MEDICARE / Product Type: *No Product type* /    Admit Date/Time:  11/09/2024  1:49 PM  Primary Diagnosis:  Embolic stroke of right basal ganglia [facility])  Hospital Problems: Principal Problem:   Embolic stroke of right basal ganglia [facility])    Expected Discharge Date: Expected Discharge Date: 11/18/24  Team Members Present: Physician leading conference: Dr. Sven Elks Social Worker Present: Waverly Gentry, LCSW-A Nurse Present: Barnie Ronde, RN PT Present: Sherlean Perks, PT OT Present: Monica Peacock, OT SLP Present: Rosina Downy, SLP     Current Status/Progress Goal Weekly Team Focus  Bowel/Bladder   Continent of bowel and bladder;LBM 11/16/24   Remain continent of bowel and bladder.   Assess bowel and bladder needs q4 hours and toilet as needed.    Swallow/Nutrition/ Hydration               ADL's   Occ min assist BADL, Transitioning to sup/mod I   Supervision / mod I BADL   Discharge planning, caregiver education    Mobility   bed mobility - mod I with bed rail use, gait - close SBA/CGA, transfers - supervision/SBA   supervision/mod I  gait, stairs, endurance training, balance    Communication                Safety/Cognition/ Behavioral Observations               Pain   Patient denies pain.   Remain free from pain.   Assess pain q shift and PRN.    Skin   Skin is intact, scattered bruising.   Remain free from skin breakdown.  Assess skin q shift and PRN.      Discharge Planning:  Home with 24/7 family support. Plans to return to work if applicable. Awaiting therapy follow-up recommendations.   Team Discussion: Patient admitted post right basal  ganglia CVA with left sided weakness; history of A-Fib on Eliquis  and HLD on Crestor . Progress limited by lightheadedness and dizziness during transitions, poor endurance and balance issues.   Patient on target to meet rehab goals: yes, currently has met her goals for discharge and excelled in some; ambulating 300' with SBA - CGA and completes transfers with supervision.   *See Care Plan and progress notes for long and short-term goals.   Revisions to Treatment Plan:  N/a   Teaching Needs: Safety, medications, transfers, toileting, etc.   Current Barriers to Discharge: Decreased caregiver support and Home enviroment access/layout  Possible Resolutions to Barriers: Family education scheduled 11/17/24 OP follow up services DME: bedrails for bed     Medical Summary Current Status: CVA, afib, hypocalcemia, hypothyroidism, insomnia  Barriers to Discharge: Medical stability  Barriers to Discharge Comments: CVA, afib, hypocalcemia, hypothyroidism, insomnia Possible Resolutions to [employer] Focus: continue Eliquis , continue calcium  citrate, continue synthroid , continue melatonin   Continued Need for Acute Rehabilitation Level of Care: The patient requires daily medical management by a physician with specialized training in physical medicine and rehabilitation for the following reasons: Direction of a multidisciplinary physical rehabilitation program to maximize functional independence : Yes Medical management of patient stability for increased activity during participation in an intensive rehabilitation regime.:  Yes Analysis of laboratory values and/or radiology reports with any subsequent need for medication adjustment and/or medical intervention. : Yes   I attest that I was present, lead the team conference, and concur with the assessment and plan of the team.   Abigail Ryan B 11/17/2024, 4:05 PM

## 2024-11-17 NOTE — Progress Notes (Signed)
 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     ROS: LBM 12/9, +urinary urgency- improved, denies pain, +cold symptoms- resolved   Objective:   No results found. No results for input(s): WBC, HGB, HCT, PLT in the last 72 hours.  No results for input(s): NA, K, CL, CO2, GLUCOSE, BUN, CREATININE, CALCIUM  in the last 72 hours.   Intake/Output Summary (Last 24 hours) at 11/17/2024 1249 Last data filed at 11/17/2024 0735 Gross per 24 hour  Intake 960 ml  Output 200 ml  Net 760 ml        Physical Exam: Vital Signs Blood pressure 134/85, pulse 80, temperature 98.5 F (36.9 C), temperature source Oral, resp. rate 16, height 5' 4 (1.626 m), weight 108.7 kg, SpO2 95%. Gen: no distress, normal appearing, sitting in chair, in bright and positive mood HENT:     Head: Normocephalic and atraumatic.     Comments: Mild L facial droop Tongue midline Facial sensation questionably less on L- she reports it's intermittent    Right Ear: External ear normal.     Left Ear: External ear normal.     Nose: Nose normal. No congestion.     Mouth/Throat:     Mouth: Mucous membranes are dry.     Pharynx: No oropharyngeal exudate.  Eyes:     General:        Right eye: No discharge.        Left eye: No discharge.     Extraocular Movements: Extraocular movements intact.     Comments: No nystagmus  Skin:    General: Skin is warm and dry.     Findings: Bruising present.     Comments: A lot of large purple bruising in Ue's- B/L from IV sticks and blood  draws Midline R upper arm  Neurological:     Mental Status: She is alert.     Comments: Ox3 Questionable mild sensory deficit of L side of face No hoffman's Intact to light touch in x4 extremities No increased tone or clonus Intact naming and repetition   Psychiatric:   Positive and cheerful!   Physical exam unchanged from the above on reexamination 11/17/24    Assessment/Plan: 1. Functional deficits which require 3+ hours per day of interdisciplinary therapy in a comprehensive inpatient rehab setting. Physiatrist is providing close team supervision and 24 hour management of active medical problems listed below. Physiatrist and rehab team continue to assess barriers to discharge/monitor patient progress toward functional and medical goals  Care Tool:  Bathing    Body parts bathed by patient: Left arm, Chest, Abdomen, Front perineal area, Right upper leg, Left upper leg, Face, Right arm, Buttocks, Right lower leg, Left lower leg   Body  parts bathed by helper: Buttocks, Right arm, Right lower leg, Left lower leg     Bathing assist Assist Level: Independent with assistive device     Upper Body Dressing/Undressing Upper body dressing   What is the patient wearing?: Pull over shirt    Upper body assist Assist Level: Independent with assistive device    Lower Body Dressing/Undressing Lower body dressing      What is the patient wearing?: Underwear/pull up, Pants     Lower body assist Assist for lower body dressing: Independent with assitive device     Toileting Toileting Toileting Activity did not occur (Clothing management and hygiene only): N/A (no void or bm)  Toileting assist Assist for toileting: Independent with assistive device     Transfers Chair/bed transfer  Transfers assist     Chair/bed transfer assist level: Independent with assistive device     Locomotion Ambulation   Ambulation assist      Assist level: Supervision/Verbal  cueing Assistive device: Walker-rolling Max distance: 175   Walk 10 feet activity   Assist     Assist level: Supervision/Verbal cueing Assistive device: Walker-rolling   Walk 50 feet activity   Assist Walk 50 feet with 2 turns activity did not occur: Safety/medical concerns  Assist level: Supervision/Verbal cueing Assistive device: Walker-rolling    Walk 150 feet activity   Assist Walk 150 feet activity did not occur: Safety/medical concerns  Assist level: Supervision/Verbal cueing Assistive device: Walker-rolling    Walk 10 feet on uneven surface  activity   Assist Walk 10 feet on uneven surfaces activity did not occur: Safety/medical concerns         Wheelchair     Assist Is the patient using a wheelchair?: No Type of Wheelchair: Manual    Wheelchair assist level: Minimal Assistance - Patient > 75% Max wheelchair distance: 37ft    Wheelchair 50 feet with 2 turns activity    Assist        Assist Level: Minimal Assistance - Patient > 75%   Wheelchair 150 feet activity     Assist      Assist Level: Maximal Assistance - Patient 25 - 49%   Blood pressure 134/85, pulse 80, temperature 98.5 F (36.9 C), temperature source Oral, resp. rate 16, height 5' 4 (1.626 m), weight 108.7 kg, SpO2 95%.  Medical Problem List and Plan: 1. Functional deficits secondary to R internal capsule stroke/ Basal ganglia               -patient may  shower- cover/remove midline             -ELOS/Goals: 9 days modI  - Stable to continue inpatient rehab  Discussed plan for d/c tomorrow 2.  Antithrombotics: -DVT/anticoagulation:  Pharmaceutical: Eliquis - while in hospital- pt doesn't want vs cannot afford to get Medicare D to cover it             -antiplatelet therapy: N/A 3. Pain Management: LCSW to follow for evaluation and support.  4. Mood/Behavior/Sleep: N/A             --Melatonin prn for insomnia.              -antipsychotic agents: N/A 5.  Neuropsych/cognition: This patient is capable of making decisions on her own behalf. 6. Skin/Wound Care: Routine pressure relief measures.   7. Constipation: large BM 12/9: d/c prn enema, d/c suppository  8. T2DM: Hgb A1c-6.8: d/c CBG checks since well controlled  9. Class III obesity: Educated on  appropriate diet and exercise to help promote mobility and health.             --change diet  to heart healthy.  10.  A fib: Monitor HR TID, continue Eliquis   11. Hypothyroid: continue synthroid  for supplement.  12.  Kebsiella UTI: Keflex  changed to Bactrim  as per pharmacy recs  13. Hyperlipidemia: Chol- 216. LDL-151. D/c crestor  as per patient's request  14. Low ionized calcium : continue caltrate.     LOS: 8 days A FACE TO FACE EVALUATION WAS PERFORMED  Sven SQUIBB Devinn Hurwitz 11/17/2024, 12:49 PM

## 2024-11-17 NOTE — Progress Notes (Signed)
 Physical Therapy Discharge Summary  Patient Details  Name: Abigail Ryan MRN: [id number] Date of Birth: Jan 28, 1946  Date of Discharge from PT service:November 17, 2024  Today's Date: 11/17/2024 PT Individual Time: 1301-1346 PT Individual Time Calculation (min): 45 min    Patient has met 10 of 10 long term goals due to improved activity tolerance, improved balance, improved postural control, increased strength, ability to compensate for deficits, improved attention, improved awareness, and improved coordination.  Patient to discharge at an ambulatory level Supervision.   Patient's care partner is independent to provide the necessary physical assistance at discharge.  Reasons goals not met: n/a  Recommendation:  Patient will benefit from ongoing skilled PT services in outpatient setting to continue to advance safe functional mobility, address ongoing impairments in functional mobility, decrease caregiver burden, increase cardiovascular endurance, and minimize fall risk.  Equipment: 3-1 BSC  Reasons for discharge: treatment goals met  Patient/family agrees with progress made and goals achieved: Yes  PT Discharge Precautions/Restrictions Precautions Precautions: Fall Recall of Precautions/Restrictions: Intact Restrictions Weight Bearing Restrictions Per Provider Order: No Pain Interference Pain Interference Pain Effect on Sleep: 1. Rarely or not at all Pain Interference with Therapy Activities: 1. Rarely or not at all Pain Interference with Day-to-Day Activities: 1. Rarely or not at all Vision/Perception  Vision - History Ability to See in Adequate Light: 0 Adequate Perception Perception: Within Functional Limits Praxis Praxis: WFL  Cognition Overall Cognitive Status: Within Functional Limits for tasks assessed Arousal/Alertness: Awake/alert Orientation Level: Oriented X4 Attention: Selective;Alternating Selective Attention: Appears intact Memory: Appears  intact Awareness: Appears intact Problem Solving: Appears intact Executive Function: Organizing Reasoning: Appears intact Organizing: Appears intact Safety/Judgment: Appears intact Sensation Sensation Light Touch: Appears Intact Hot/Cold: Appears Intact Proprioception: Appears Intact Stereognosis: Appears Intact Coordination Gross Motor Movements are Fluid and Coordinated: Yes Fine Motor Movements are Fluid and Coordinated: No Coordination and Movement Description: toe taps Motor  Motor Motor - Discharge Observations: non dominant left hemiparesis  Mobility Bed Mobility Bed Mobility: Supine to Sit;Sit to Supine Rolling Right: Independent with assistive device Supine to Sit: Independent with assistive device Sit to Supine: Independent with assistive device Transfers Transfers: Sit to Stand;Stand to Sit;Stand Pivot Transfers Sit to Stand: Independent with assistive device Stand to Sit: Independent with assistive device Stand Pivot Transfers: Independent with assistive device Transfer (Assistive device): Rolling walker Locomotion  Gait Ambulation: Yes Gait Assistance: Supervision/Verbal cueing;Contact Guard/Touching assist Gait Distance (Feet): 305 Feet Assistive device: Rolling walker Gait Assistance Details: Verbal cues for precautions/safety;Verbal cues for safe use of DME/AE Gait Gait: Yes Gait Pattern: Impaired Gait Pattern: Step-through pattern;Decreased step length - right;Decreased stance time - left;Decreased stride length;Decreased hip/knee flexion - left;Decreased hip/knee flexion - right;Decreased dorsiflexion - left;Decreased weight shift to left;Narrow base of support;Poor foot clearance - left;Poor foot clearance - right Gait velocity: decr Stairs / Additional Locomotion Stairs: Yes Stairs Assistance: Contact Guard/Touching assist Stair Management Technique: One rail Right Number of Stairs: 12 Height of Stairs: 6 Ramp: Contact Guard/touching assist Curb:  Contact Guard/Touching assist Wheelchair Mobility Wheelchair Mobility: No  Trunk/Postural Assessment  Cervical Assessment Cervical Assessment: Within Functional Limits Thoracic Assessment Thoracic Assessment: Within Functional Limits Lumbar Assessment Lumbar Assessment: Within Functional Limits Postural Control Postural Control: Within Functional Limits  Balance Balance Balance Assessed: Yes Static Sitting Balance Static Sitting - Balance Support: No upper extremity supported;Feet supported Static Sitting - Level of Assistance: 7: Independent Dynamic Sitting Balance Dynamic Sitting - Balance Support: Right upper extremity supported;Left upper extremity supported;Feet supported;During functional  activity Dynamic Sitting - Level of Assistance: 6: Modified independent (Device/Increase time) Static Standing Balance Static Standing - Balance Support: During functional activity;Left upper extremity supported;Right upper extremity supported Static Standing - Level of Assistance: 6: Modified independent (Device/Increase time) Dynamic Standing Balance Dynamic Standing - Balance Support: During functional activity;Bilateral upper extremity supported Dynamic Standing - Level of Assistance: 6: Modified independent (Device/Increase time) Extremity Assessment  RLE Assessment RLE Assessment: Within Functional Limits LLE Assessment LLE Assessment: Exceptions to [facility] Active Range of Motion (AROM) Comments: decr DF vs. R General Strength Comments: decr DF  Session: Pt seated in WC with family at bedside. Nsg staff administering meds  upon arrival. Reports no pain and excitement regarding d/c. Pt performs sit>stand and ambulates with close SBA/CGA throughout session.   Pt ambulates from room>main gym with RW, min cueing provided to maintain fwd gaze and ease hand grip from RW. PT provide family education on cueing as well. Asc/desc 4-6in stairs with R hand rail use and step-to pattern with CGA x3.  Family members assisted with CGA to ensure pt safety upon d/c. Pt ambulated to ortho gym with RW and competed car transfer with SBA and PT provided family ed on necessary cueing. Pt demonstrated ability to navigate ramp and mulch with min-mod cueing for safety.  Pt ambulated back to room with PT instructing family on best way to provide SBA/CGA when ambulating. No major concerns noted. Once in room, pt was left seated in [facility] with family and bedside and handed off to OT.    Kathalene Sporer SPT 11/17/2024, 11:40 AM

## 2024-11-17 NOTE — Progress Notes (Signed)
 Occupational Therapy Note  Patient Details  Name: Abigail Ryan MRN: [id number] Date of Birth: 04/26/1946    Occupational Therapist participated in the interdisciplinary team conference, providing clinical information regarding the patient's current status, treatment goals, and weekly focus, including any barriers that need to be addressed. Please see the Inpatient Rehabilitation Team Conference and Plan of Care Update for further details.    [name] M 11/17/2024, 11:19 AM

## 2024-11-17 NOTE — Plan of Care (Cosign Needed)
 Pt met all goals and is safe to d/c home with family providing assistance prn.  Problem: RH Balance Goal: LTG Patient will maintain dynamic sitting balance (PT) Description: LTG:  Patient will maintain dynamic sitting balance with assistance during mobility activities (PT) Outcome: Completed/Met Goal: LTG Patient will maintain dynamic standing balance (PT) Description: LTG:  Patient will maintain dynamic standing balance with assistance during mobility activities (PT) Outcome: Completed/Met   Problem: Sit to Stand Goal: LTG:  Patient will perform sit to stand with assistance level (PT) Description: LTG:  Patient will perform sit to stand with assistance level (PT) Outcome: Completed/Met   Problem: RH Bed Mobility Goal: LTG Patient will perform bed mobility with assist (PT) Description: LTG: Patient will perform bed mobility with assistance, with/without cues (PT). Outcome: Completed/Met   Problem: RH Bed to Chair Transfers Goal: LTG Patient will perform bed/chair transfers w/assist (PT) Description: LTG: Patient will perform bed to chair transfers with assistance (PT). Outcome: Completed/Met   Problem: RH Furniture Transfers Goal: LTG Patient will perform furniture transfers w/assist (OT/PT) Description: LTG: Patient will perform furniture transfers  with assistance (OT/PT). Outcome: Completed/Met   Problem: RH Ambulation Goal: LTG Patient will ambulate in controlled environment (PT) Description: LTG: Patient will ambulate in a controlled environment, # of feet with assistance (PT). Outcome: Completed/Met Goal: LTG Patient will ambulate in home environment (PT) Description: LTG: Patient will ambulate in home environment, # of feet with assistance (PT). Outcome: Completed/Met Goal: LTG Patient will ambulate in community environment (PT) Description: LTG: Patient will ambulate in community environment, # of feet with assistance (PT). Outcome: Completed/Met   Problem: RH  Stairs Goal: LTG Patient will ambulate up and down stairs w/assist (PT) Description: LTG: Patient will ambulate up and down # of stairs with assistance (PT) Outcome: Completed/Met

## 2024-11-17 NOTE — Progress Notes (Signed)
 Occupational Therapy Weekly Progress Note  Patient Details  Name: Abigail Ryan MRN: [id number] Date of Birth: 1946-09-08  Beginning of progress report period: November 10, 2024 End of progress report period: November 17, 2024  Today's Date: 11/17/2024 OT Individual Time: 9154-8984 OT Individual Time Calculation (min): 90 min    Patient has met 4 of 4 short term goals.  Patient has shown improvement in all aspects of BADL due to improved functional mobility and   Patient continues to demonstrate the following deficits: decreased cardiorespiratoy endurance and abnormal tone and decreased coordination and therefore will continue to benefit from skilled OT intervention to enhance overall performance with BADL and Reduce care partner burden.  Patient progressing toward long term goals..  Continue plan of care.  OT Short Term Goals Week 1:  OT Short Term Goal 1 (Week 1): Patient will transfer to  shower with min assist OT Short Term Goal 1 - Progress (Week 1): Met OT Short Term Goal 2 (Week 1): Patient will transfer to toilet with min assist OT Short Term Goal 2 - Progress (Week 1): Met OT Short Term Goal 3 (Week 1): Patient will don LB clothing- pants/ underwear with min assist OT Short Term Goal 3 - Progress (Week 1): Met OT Short Term Goal 4 (Week 1): Patient will use left hand as gross assist to RUE during BADL OT Short Term Goal 4 - Progress (Week 1): Met Week 2:  OT Short Term Goal 1 (Week 2): STG=LTG due to LOS  Skilled Therapeutic Interventions/Progress Updates:   Patient received seated in wheelchair. Patient has clothing for today set out on bed. Patient walked without assistance to bathroom for shower. Patient showered with modified independence. Patient able to reach to both feet while seated on bench.  Patient able to reach both feet today!  Patient used modified lateral lean to wash buttocks.   Patient walked with RW to wheelchair to dress herself.  Able to don clothing with  Mod I.  Discussed family training this afternoon - recommend supervision until pattern established for shower.  Patient problem solving issues related to home set up - daughter and grand daughter are moving in with her.   Reviewed graduated driving program as patient discussed with MD.   Reviewed her plan to return to work next week.   Patient pleased with her progress - reviewed her goals and that she had surpassed some of the goals.  Left up in chair at end of session, with personal items in reach.    Therapy Documentation Precautions:  Precautions Precautions: Fall Recall of Precautions/Restrictions: Intact Restrictions Weight Bearing Restrictions Per Provider Order: No General:   Vital Signs:   Pain: Pain Assessment Pain Score: 0-No pain ADL: ADL Eating: Independent Where Assessed-Eating: Chair Grooming: Modified independent Where Assessed-Grooming: Standing at sink, Sitting at sink Upper Body Bathing: Modified independent Where Assessed-Upper Body Bathing: Shower Lower Body Bathing: Modified independent Where Assessed-Lower Body Bathing: Shower Upper Body Dressing: Modified independent (Device) Where Assessed-Upper Body Dressing: Chair Lower Body Dressing: Modified independent Where Assessed-Lower Body Dressing: Chair, Standing at sink Toileting: Modified independent Where Assessed-Toileting: Neurosurgeon Method: Proofreader: Drop arm bedside commode Tub/Shower Transfer: Modified independent Web Designer Method: Ship Broker: Insurance Underwriter: Modified independent Film/video Editor Method: Designer, Industrial/product: Sales Promotion Account Executive Baseline Vision/History: 0 No visual deficits Patient Visual Report: No change from baseline Vision Assessment?: Wears glasses for reading Perception  Perception: Within Functional  Limits Praxis Praxis: WFL Exercises:   Other Treatments:     Therapy/Group: Individual Therapy  Masaru Chamberlin M 11/17/2024, 12:25 PM

## 2024-11-18 ENCOUNTER — Other Ambulatory Visit (HOSPITAL_COMMUNITY): Payer: Self-pay

## 2024-11-18 MED ORDER — APIXABAN 5 MG PO TABS: 5.0000 mg | ORAL_TABLET | Freq: Two times a day (BID) | ORAL | 0 refills | Status: AC

## 2024-11-18 NOTE — Progress Notes (Signed)
 PROGRESS NOTE   Subjective/Complaints: No new complaints this morning Feels ready for d/c today Discussed elevated blood pressure yesterday was after walking with therapy     11/18/2024    3:53 AM 11/17/2024    8:33 PM 11/17/2024    3:50 PM  Vitals with BMI  Systolic 141 136 867  Diastolic 79 64 57  Pulse 69 72 72    No results for input(s): GLUCAP in the last 72 hours.   ROS: LBM 12/10, +urinary urgency- improved, denies pain, +cold symptoms- resolved, denies pain   Objective:   No results found. No results for input(s): WBC, HGB, HCT, PLT in the last 72 hours.  No results for input(s): NA, K, CL, CO2, GLUCOSE, BUN, CREATININE, CALCIUM  in the last 72 hours.   Intake/Output Summary (Last 24 hours) at 11/18/2024 1026 Last data filed at 11/18/2024 0716 Gross per 24 hour  Intake 580 ml  Output --  Net 580 ml        Physical Exam: Vital Signs Blood pressure (!) 141/79, pulse 69, temperature 98.4 F (36.9 C), temperature source Oral, resp. rate 16, height 5' 4 (1.626 m), weight 108.7 kg, SpO2 96%. Gen: no distress, normal appearing, sitting in chair, in bright and positive mood HENT:     Head: Normocephalic and atraumatic.     Comments: Mild L facial droop Tongue midline Facial sensation questionably less on L- she reports it's intermittent    Right Ear: External ear normal.     Left Ear: External ear normal.     Nose: Nose normal. No congestion.     Mouth/Throat:     Mouth: Mucous membranes are dry.     Pharynx: No oropharyngeal exudate.  Eyes:     General:        Right eye: No discharge.        Left eye: No discharge.     Extraocular Movements: Extraocular movements intact.     Comments: No nystagmus  Skin:    General: Skin is warm and dry.     Findings: Bruising present.     Comments: A lot of large purple bruising in Ue's- B/L from IV sticks and blood draws Midline  R upper arm  Neurological:     Mental Status: She is alert.     Comments: Ox3 Questionable mild sensory deficit of L side of face No hoffman's Intact to light touch in x4 extremities No increased tone or clonus Intact naming and repetition MSK: protracted posture   Psychiatric:   Positive and cheerful!   Physical exam unchanged from the above on reexamination 11/18/2024    Assessment/Plan: 1. Functional deficits which require 3+ hours per day of interdisciplinary therapy in a comprehensive inpatient rehab setting. Physiatrist is providing close team supervision and 24 hour management of active medical problems listed below. Physiatrist and rehab team continue to assess barriers to discharge/monitor patient progress toward functional and medical goals  Care Tool:  Bathing    Body parts bathed by patient: Left arm, Chest, Abdomen, Front perineal area, Right upper leg, Left upper leg, Face, Right arm, Buttocks, Right lower leg, Left lower leg   Body parts bathed by helper:  Buttocks, Right arm, Right lower leg, Left lower leg     Bathing assist Assist Level: Independent with assistive device     Upper Body Dressing/Undressing Upper body dressing   What is the patient wearing?: Pull over shirt    Upper body assist Assist Level: Independent with assistive device    Lower Body Dressing/Undressing Lower body dressing      What is the patient wearing?: Underwear/pull up, Pants     Lower body assist Assist for lower body dressing: Independent with assitive device     Toileting Toileting Toileting Activity did not occur (Clothing management and hygiene only): N/A (no void or bm)  Toileting assist Assist for toileting: Independent with assistive device     Transfers Chair/bed transfer  Transfers assist     Chair/bed transfer assist level: Independent with assistive device     Locomotion Ambulation   Ambulation assist      Assist level: Supervision/Verbal  cueing Assistive device: Walker-rolling Max distance: 305   Walk 10 feet activity   Assist     Assist level: Supervision/Verbal cueing Assistive device: Walker-rolling   Walk 50 feet activity   Assist Walk 50 feet with 2 turns activity did not occur: Safety/medical concerns  Assist level: Supervision/Verbal cueing Assistive device: Walker-rolling    Walk 150 feet activity   Assist Walk 150 feet activity did not occur: Safety/medical concerns  Assist level: Supervision/Verbal cueing Assistive device: Walker-rolling    Walk 10 feet on uneven surface  activity   Assist Walk 10 feet on uneven surfaces activity did not occur: Safety/medical concerns   Assist level: Contact Guard/Touching assist Assistive device: Walker-rolling   Wheelchair     Assist Is the patient using a wheelchair?: No Type of Wheelchair: Manual    Wheelchair assist level: Minimal Assistance - Patient > 75% Max wheelchair distance: 55ft    Wheelchair 50 feet with 2 turns activity    Assist        Assist Level: Minimal Assistance - Patient > 75%   Wheelchair 150 feet activity     Assist      Assist Level: Maximal Assistance - Patient 25 - 49%   Blood pressure (!) 141/79, pulse 69, temperature 98.4 F (36.9 C), temperature source Oral, resp. rate 16, height 5' 4 (1.626 m), weight 108.7 kg, SpO2 96%.  Medical Problem List and Plan: 1. Functional deficits secondary to R internal capsule stroke/ Basal ganglia               -patient may  shower- cover/remove midline             -ELOS/Goals: 9 days modI  - Stable to continue inpatient rehab  D/c home 2.  Antithrombotics: -DVT/anticoagulation:  Pharmaceutical: Eliquis - while in hospital- pt doesn't want vs cannot afford to get Medicare D to cover it             -antiplatelet therapy: N/A 3. Pain Management: LCSW to follow for evaluation and support.  4. Mood/Behavior/Sleep: N/A             --Melatonin prn for insomnia.               -antipsychotic agents: N/A 5. Neuropsych/cognition: This patient is capable of making decisions on her own behalf. 6. Skin/Wound Care: Routine pressure relief measures.   7. Constipation: large BM 12/9: d/c prn enema, d/c suppository  8. T2DM: Hgb A1c-6.8: d/c CBG checks since well controlled  9. Class III obesity: Educated on appropriate diet  and exercise to help promote mobility and health.             --change diet  to heart healthy.  10.  A fib: Monitor HR TID, continue Eliquis   11. Hypothyroid: continue synthroid  for supplement.  12.  Kebsiella UTI: Keflex  changed to Bactrim  as per pharmacy recs  13. Hyperlipidemia: Chol- 216. LDL-151. D/c crestor  as per patient's request  14. Low ionized calcium : continue caltrate.     >30 minutes spent in discharge of patient including review of medications and follow-up appointments, physical examination, and in answering all patient's questions   LOS: 9 days A FACE TO FACE EVALUATION WAS PERFORMED  Nader Boys P Joe Tanney 11/18/2024, 10:26 AM

## 2024-11-18 NOTE — Progress Notes (Signed)
 Inpatient Rehabilitation Care Coordinator Discharge Note   Patient Details  Name: Abigail Ryan MRN: [id number] Date of Birth: 06/22/1946   Discharge location: Home with daughter and granddaughter  Length of Stay: 8 days  Discharge activity level: Supervision/Verbal cueing  Home/community participation: Active in the community  Patient response un:Yzjouy Literacy - How often do you need to have someone help you when you read instructions, pamphlets, or other written material from your doctor or pharmacy?: Never  Patient response un:Dnrpjo Isolation - How often do you feel lonely or isolated from those around you?: Never  Services provided included: RD, MD, PT, OT, SLP, TR, CM, RN, Pharmacy, SW  Financial Services:  Field Seismologist Utilized: Private Insurance [employer] MEDICARE / [facility] MEDICARE  Choices offered to/list presented to: Patient  Follow-up services arranged:  Outpatient, DME    Outpatient Servicies: PT DME : RW and BSC    Patient response to transportation need: Is the patient able to respond to transportation needs?: Yes In the past 12 months, has lack of transportation kept you from medical appointments or from getting medications?: No In the past 12 months, has lack of transportation kept you from meetings, work, or from getting things needed for daily living?: No   Patient/Family verbalized understanding of follow-up arrangements:  Yes  Individual responsible for coordination of the follow-up plan: Patient  Confirmed correct DME delivered: [name]  [name] 11/18/2024    Comments (or additional information): Patient's family participated in family education and able to help address 24/7 care needs.   Summary of Stay    Date/Time Discharge Planning CSW  11/17/24 1802 Home with 24/7 family support. Plans to return to work if applicable. Therapy recommending OP PT/OT. DME: drop arm commode and rolling walker ordered via [employer]. DS  11/17/24 0854  Home with 24/7 family support. Plans to return to work if applicable. Awaiting therapy follow-up recommendations. DS       [name]  [name]

## 2024-11-18 NOTE — Progress Notes (Signed)
 Inpatient Rehabilitation Discharge Medication Review by a Pharmacist  A complete drug regimen review was completed for this patient to identify any potential clinically significant medication issues.  High Risk Drug Classes Is patient taking? Indication by Medication  Antipsychotic No   Anticoagulant Yes Apixaban  - afib, new CVA  Antibiotic No   Opioid No   Antiplatelet No   Hypoglycemics/insulin  No   Vasoactive Medication No   Chemotherapy No   Other Yes Levothyroxine  - hypothyroidism Melatonin - sleep regulation Ca, Vit D - supplement Pantoprazole  - GERD     Type of Medication Issue Identified Description of Issue Recommendation(s)  Drug Interaction(s) (clinically significant)     Duplicate Therapy     Allergy     No Medication Administration End Date     Incorrect Dose     Additional Drug Therapy Needed     Significant med changes from prior encounter (inform family/care partners about these prior to discharge).  Patient on multiple natural supplements prior to admission.  Patient was advised to stop these supplements on discharge.  Other       Clinically significant medication issues were identified that warrant physician communication and completion of prescribed/recommended actions by midnight of the next day:  No  Name of provider notified for urgent issues identified:   Provider Method of Notification:    Pharmacist comments:   Patient does not have Medicare Part D prescription coverage and has already utilized the 30d free card for apixaban .  Patient prefers natural remedies over prescription medication.  Advised patient of [facility] patient assistance program in case patient was interested in applying for patient assistance for apixaban .  Patient declined.  She is aware of the risks and chose not to be on prescription anticoagulation at this time.  Time spent performing this drug regimen review (minutes):  20   Edelmira Gallogly, Suzen Acre 11/18/2024 9:57 AM

## 2024-11-18 NOTE — Progress Notes (Addendum)
 Patient does not have medicare part D and thus no coverage for Eliquis  and does not plan on using any prescribed blood thinner. Reached out to [facility] about assistance and they were able to process 30 days of Xarelto  for free and could attempt to work on pharmacy assistance for free meds. Patient declined this--she reports that she is aware of risks and benefits of Eliquis  and does not feel comfortable with it. She has done alternative medicine for a long time and has done well on her high dose St John's wort and other supplements so far and plans to resume those as is more comfortable with them.   Printed Rx for Eliquis  and $10 discount assistance card given to patient in case she changes her mind.

## 2024-11-23 NOTE — Discharge Summary (Signed)
 Physician Discharge Summary  Patient ID: Abigail Ryan MRN: [id number] DOB/AGE: [birth year] 78 y.o.  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) Active Problems:   Hypothyroid   Type 2 diabetes mellitus with other specified complication (HCC)   Paroxysmal A-fib (HCC)   Primary hypertension   Hypocalcemia   Discharged Condition: stable  Significant Diagnostic Studies: N/A   Labs:  Basic Metabolic Panel:    Latest Ref Rng & Units 11/11/2024    5:27 AM 11/10/2024    5:18 AM 11/09/2024    2:21 PM  BMP  Glucose 70 - 99 mg/dL 871  869  867   BUN 8 - 23 mg/dL 22  21  18    Creatinine 0.44 - 1.00 mg/dL 8.98  8.91  9.11   Sodium 135 - 145 mmol/L 135  138  134   Potassium 3.5 - 5.1 mmol/L 4.5  4.6  4.7   Chloride 98 - 111 mmol/L 98  97  99   CO2 22 - 32 mmol/L 27  28  27    Calcium  8.9 - 10.3 mg/dL 8.9  9.4  8.9      CBC:    Latest Ref Rng & Units 11/11/2024    5:27 AM 11/10/2024    5:18 AM 11/05/2024    4:00 AM  CBC  WBC 4.0 - 10.5 K/uL 8.5  7.8  7.9   Hemoglobin 12.0 - 15.0 g/dL 85.8  85.9  87.6   Hematocrit 36.0 - 46.0 % 42.6  42.2  36.7   Platelets 150 - 400 K/uL 226  242  198      CBG: No results for input(s): GLUCAP in the last 168 hours.  Brief HPI:   Abigail Ryan is a 78 y.o. female with history of HTN, T2DM, obesity class III, A-fib in setting of sepsis in the past who was admitted on 11/04/2024 with left-sided weakness and fall.  She was also found to have sensory deficits left face and TNKase  admitted ministered as CT head negative.  MRI brain done revealing vague area of restricted diffusion within white matter and posterior rim right internal capsule compatible with acute/subacute small vessel infarct.  Dr. Jerri felt that stroke was cardioembolic in setting of PAF and Eliquis  was added after discussion with cardiology.    Crestor  was added due to elevated LDL of 151.   She did report left shoulder pain that started after her recent fall and x-rays done showing moderate degenerative changes AC and glenohumeral joints.  She has had improvement in left-sided weakness but continued to be limited by left knee instability.  She was requiring min assist for mobility and min to max assist with ADLs.  She lives alone and was independent prior to admission.  CIR was recommended due to functional decline.   Hospital Course: ELIZETH WEINRICH was admitted to rehab 11/09/2024 for inpatient therapies to consist of PT and OT at least three hours five days a week. Past admission physiatrist, therapy team and rehab RN have worked together to provide customized collaborative inpatient rehab.  She was maintained on Eliquis  during his stay and is tolerating this without side effects.  Pharmacy was contacted for input and assistance with DOAC and patient was offered Xarelto  however declined this despite education of stroke risk.  She plans on using her vitamins which she feels has worked for her so far and has no plans on starting any  blood thinners.  CBC shows H&H and platelets to be stable.  She has refused to take statins therefore this was discontinued  Her blood pressures were monitored on TID basis and has been controlled.  Hemoglobin A1c was noted to be elevated at 6.8 and fasting blood sugars noted at 120-130 range.  She has been educated on carb modified diet and is to follow-up with PCP for further input on management/medication.  She reported dysuria and was found to have Klebsiella UTI and was treated with 3-day course of Bactrim ..  She is continent of bowel and bladder and constipation has resolved.   Caltrate was added for low ionized calcium .  She has made steady gains during her rehab stay and supervision is recommended for safety with ambulation.  She will continue to receive follow-up outpatient PT at [facility] after discharge.   Rehab course: During patient's stay in  rehab weekly team conferences were held to monitor patient's progress, set goals and discuss barriers to discharge. At admission, patient required mod assist with basic ADL tasks and with mobility. She  has had improvement in activity tolerance, balance, postural control as well as ability to compensate for deficits.  She is able to complete ADL task at modified independent level. mShe is independent for transfers and is able to ambulate 305 feet with use of rolling walker and contact-guard assist to supervision as well as verbal cues.  She is able to climb 12 stairs with contact-guard assist.  Family education has been completed.  Discharge disposition: 01-Home or Self Care  Diet: Heart healthy/carb modified.   Special Instructions: Needs supervision for safety with ambulation.   Discharge Instructions     Ambulatory referral to Neurology   Complete by: As directed    An appointment is requested in approximately: 6 weeks   Ambulatory referral to Physical Medicine Rehab   Complete by: As directed    Hospital follow up   Ambulatory referral to Physical Therapy   Complete by: As directed    Eval and treat      Allergies as of 11/18/2024       Reactions   Morphine And Codeine Nausea Only, Other (See Comments)   Extreme headaches Reactions occur with both codeine and morphine.        Medication List     STOP taking these medications    OVER THE COUNTER MEDICATION   OVER THE COUNTER MEDICATION   OVER THE COUNTER MEDICATION   OVER THE COUNTER MEDICATION   OVER THE COUNTER MEDICATION   OVER THE COUNTER MEDICATION   OVER THE COUNTER MEDICATION   OVER THE COUNTER MEDICATION   OVER THE COUNTER MEDICATION   rosuvastatin  20 MG tablet Commonly known as: CRESTOR        TAKE these medications    apixaban  5 MG Tabs tablet Commonly known as: ELIQUIS  Take 1 tablet (5 mg total) by mouth 2 (two) times daily.   calcium  citrate 950 (200 Ca) MG tablet Commonly known as:  CALCITRATE - dosed in mg elemental calcium  Take 1 tablet (950 mg total) by mouth daily with supper.   levothyroxine  150 MCG tablet Commonly known as: SYNTHROID  Take 1 tablet (150 mcg total) by mouth daily before breakfast. What changed: when to take this   melatonin 3 MG Tabs tablet Take 1 tablet (3 mg total) by mouth at bedtime.   nitroGLYCERIN  0.4 MG SL tablet Commonly known as: NITROSTAT  Place 1 tablet (0.4 mg total) under the tongue every 5 (  five) minutes as needed for chest pain.   VITAMIN D -3 PO Take 1 capsule by mouth daily.        Follow-up Information     Loring Tanda Mae, MD Follow up.   Specialty: Family Medicine Why: Call in 1-2 days for post hospital follow up Contact information: [address] [place] [address] [phone]         Lorilee Sven SQUIBB, MD Follow up.   Specialty: Physical Medicine and Rehabilitation Why: office will call you with follow up appointment Contact information: [address] [place] [address] [phone]         [facility] Follow up.   Why: office will call you with follow up appointment Contact information: [address]     [address] [place]  [address] [phone]                Signed: Sharlet GORMAN Schmitz 11/23/2024, 4:10 PM

## 2024-11-30 ENCOUNTER — Telehealth: Payer: Self-pay

## 2024-11-30 NOTE — Telephone Encounter (Signed)
 Abigail Ryan called to report:   For two days her left leg has been swelling and now it is twice its size. Her toes are red and warm to the touch.   Patient has been advised to go to the nearest ER for evaluation. Dr. Olla will be informed.

## 2024-12-01 ENCOUNTER — Encounter: Admitting: Physical Medicine and Rehabilitation

## 2024-12-01 NOTE — Progress Notes (Signed)
 Left message for patient to discuss her leg swelling

## 2024-12-13 ENCOUNTER — Encounter: Payer: Self-pay | Admitting: Physical Medicine and Rehabilitation

## 2024-12-13 ENCOUNTER — Encounter: Payer: Self-pay | Attending: Physical Medicine and Rehabilitation | Admitting: Physical Medicine and Rehabilitation

## 2024-12-13 VITALS — BP 137/84 | HR 74 | Ht 64.0 in | Wt 242.4 lb

## 2024-12-13 DIAGNOSIS — R200 Anesthesia of skin: Secondary | ICD-10-CM | POA: Insufficient documentation

## 2024-12-13 DIAGNOSIS — I639 Cerebral infarction, unspecified: Secondary | ICD-10-CM | POA: Diagnosis not present

## 2024-12-13 DIAGNOSIS — E039 Hypothyroidism, unspecified: Secondary | ICD-10-CM | POA: Insufficient documentation

## 2024-12-13 NOTE — Progress Notes (Signed)
 "  Subjective:    Patient ID: Abigail Ryan, female    DOB: Sep 02, 1946, 79 y.o.   MRN: [id number]  HPI 1) CVA -has had three episodes where she has felt low energy, but she recovers quickly -LLE swelling improves at night -has chosen note to pay for Eliquis  -has the support of her daughter -started taking Brain Evate when she got home, but stopped after it made her feel unwell  2) Numbness in left foot: -intermittent  Pain Inventor Average Pain 0 Pain Right Now 0 My pain is N/A  In the last 24 hours, has pain interfered with the following? General activity 8 Relation with others 10 Enjoyment of life 10 What TIME of day is your pain at its worst? N/A Sleep (in general) NA  Pain is worse with: N/A Pain improves with: N/A Relief from Meds: N/A  use a walker how many minutes can you walk? Varies, walks various times throughout the day ability to climb steps?  yes do you drive?  yes  retired  bladder control problems numbness  Any changes since last visit?  yes  Primary care Dr. Loring, MD    Family History  Problem Relation Age of Onset   Breast cancer Daughter    Heart disease Mother 41   Heart disease Father 28   Social History   Socioeconomic History   Marital status: Married    Spouse name: Not on file   Number of children: Not on file   Years of education: Not on file   Highest education level: Not on file  Occupational History   Occupation: Retired  Tobacco Use   Smoking status: Never   Smokeless tobacco: Never  Vaping Use   Vaping status: Never Used  Substance and Sexual Activity   Alcohol  use: No   Drug use: No   Sexual activity: Not on file  Other Topics Concern   Not on file  Social History Narrative   Lives in [place] w/ husband, who has Alzheimer's   Social Drivers of Health   Tobacco Use: Low Risk (12/13/2024)   Patient History    Smoking Tobacco Use: Never    Smokeless Tobacco Use: Never    Passive Exposure: Not on file   Financial Resource Strain: Not on file  Food Insecurity: No Food Insecurity (11/05/2024)   Epic    Worried About Programme Researcher, Broadcasting/film/video in the Last Year: Never true    Ran Out of Food in the Last Year: Never true  Transportation Needs: No Transportation Needs (11/05/2024)   Epic    Lack of Transportation (Medical): No    Lack of Transportation (Non-Medical): No  Physical Activity: Not on file  Stress: Not on file  Social Connections: Unknown (11/05/2024)   Social Connection and Isolation Panel    Frequency of Communication with Friends and Family: More than three times a week    Frequency of Social Gatherings with Friends and Family: Twice a week    Attends Religious Services: More than 4 times per year    Active Member of Clubs or Organizations: No    Attends Banker Meetings: Never    Marital Status: Patient unable to answer  Depression (PHQ2-9): Low Risk (12/13/2024)   Depression (PHQ2-9)    PHQ-2 Score: 0  Alcohol  Screen: Not on file  Housing: Low Risk (11/05/2024)   Epic    Unable to Pay for Housing in the Last Year: No    Number of Times Moved in  the Last Year: 0    Homeless in the Last Year: No  Utilities: Not At Risk (11/05/2024)   Epic    Threatened with loss of utilities: No  Health Literacy: Not on file   Past Surgical History:  Procedure Laterality Date   ABDOMINAL HYSTERECTOMY     APPENDECTOMY     CHOLECYSTECTOMY     Past Medical History:  Diagnosis Date   Renal stones    Thyroid  disease    BP 137/84 (BP Location: Left Arm, Patient Position: Sitting, Cuff Size: Large)   Pulse 74   Ht 5' 4 (1.626 m)   Wt 242 lb 6.4 oz (110 kg)   SpO2 95%   BMI 41.61 kg/m   Opioid Risk Score:   Fall Risk Score:  `1  Depression screen [facility] 2/9     12/13/2024   11:09 AM  Depression screen PHQ 2/9  Decreased Interest 0  Down, Depressed, Hopeless 0  PHQ - 2 Score 0  Altered sleeping 0  Tired, decreased energy 0  Change in appetite 0  Feeling bad or  failure about yourself  0  Trouble concentrating 0  Moving slowly or fidgety/restless 0  Suicidal thoughts 0  PHQ-9 Score 0  Difficult doing work/chores Not difficult at all      Review of Systems  Respiratory:  Positive for cough.   Cardiovascular:  Positive for leg swelling.  Neurological:  Positive for numbness.  All other systems reviewed and are negative.      Objective:   Physical Exam  Gen: no distress, normal appearing HEENT: oral mucosa pink and moist, NCAT Cardio: Reg rate Chest: normal effort, normal rate of breathing Abd: soft, non-distended Ext: mild LLE edema Psych: pleasant, normal affect Skin: intact Neuro: Alert and oriented x3, strength 5/5 throughout      Assessment & Plan:   1) CVA -discussed that she is walking well -discussed that daughter is encouraging her to spend time outside -discussed that she continues to use the RW -discussed that she feels stable with the support of a counter or the wall -discussed that she feels she is close to transitioning to the cane -discussed that she is going her own therapy at her house -discussed that the copay for home therapy was too much for her -continue handicap placard -discussed the supplements she is taking  2) Left foot numbness: -discussed that this is intermittent  3) Hypothyroidism: -continue synthroid   4) Lower extremity edema: -discussed that this fluctuates and improves with ambulation  40 minutes spent in discussion of her stroke recovery, physical examination, discussed the supplements she takes, discussed her left foot numbness, discussed that therapy copay was 50 dollars so she deferred therapy, discussed that she has been ambulating well with the RW and tries to use just the wall and counter and times when she is in her home, discussed that she does have canes at home, discussed that she no longer needs to follow here since she has recovered so well, discussed that the only medication  she is currently taking is Synthroid , discussed that the Eliquis  was too expensive for her, 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

## 2024-12-13 NOTE — Patient Instructions (Signed)
[phone] °

## 2025-02-10 ENCOUNTER — Inpatient Hospital Stay: Admitting: Neurology
